# Patient Record
Sex: Female | Born: 1975 | Race: White | Hispanic: No | Marital: Married | Smoking: Never smoker
Health system: Southern US, Community
[De-identification: ages and names within clinical notes are randomized; demographics above are authoritative.]

## PROBLEM LIST (undated history)

## (undated) DIAGNOSIS — R519 Headache, unspecified: Secondary | ICD-10-CM

## (undated) DIAGNOSIS — E7800 Pure hypercholesterolemia, unspecified: Secondary | ICD-10-CM

## (undated) DIAGNOSIS — M7050 Other bursitis of knee, unspecified knee: Secondary | ICD-10-CM

## (undated) DIAGNOSIS — K8020 Calculus of gallbladder without cholecystitis without obstruction: Secondary | ICD-10-CM

## (undated) DIAGNOSIS — M25511 Pain in right shoulder: Secondary | ICD-10-CM

## (undated) DIAGNOSIS — Z9889 Other specified postprocedural states: Secondary | ICD-10-CM

## (undated) DIAGNOSIS — F418 Other specified anxiety disorders: Secondary | ICD-10-CM

## (undated) DIAGNOSIS — G2581 Restless legs syndrome: Secondary | ICD-10-CM

## (undated) DIAGNOSIS — G4700 Insomnia, unspecified: Secondary | ICD-10-CM

## (undated) DIAGNOSIS — E785 Hyperlipidemia, unspecified: Secondary | ICD-10-CM

## (undated) DIAGNOSIS — J329 Chronic sinusitis, unspecified: Principal | ICD-10-CM

## (undated) DIAGNOSIS — Z87442 Personal history of urinary calculi: Secondary | ICD-10-CM

## (undated) DIAGNOSIS — N760 Acute vaginitis: Principal | ICD-10-CM

## (undated) DIAGNOSIS — E663 Overweight: Secondary | ICD-10-CM

## (undated) DIAGNOSIS — N200 Calculus of kidney: Secondary | ICD-10-CM

## (undated) DIAGNOSIS — L732 Hidradenitis suppurativa: Secondary | ICD-10-CM

## (undated) DIAGNOSIS — M79601 Pain in right arm: Secondary | ICD-10-CM

## (undated) HISTORY — DX: Insomnia, unspecified: G47.00

## (undated) HISTORY — PX: COLONOSCOPY: SHX174

## (undated) HISTORY — DX: Pain in right shoulder: M25.511

## (undated) HISTORY — PX: OTHER SURGICAL HISTORY: SHX169

## (undated) HISTORY — DX: Restless legs syndrome: G25.81

## (undated) HISTORY — DX: Pain in right arm: M79.601

## (undated) HISTORY — DX: Overweight: E66.3

## (undated) HISTORY — DX: Other specified anxiety disorders: F41.8

## (undated) HISTORY — DX: Acute vaginitis: N76.0

## (undated) HISTORY — DX: Other bursitis of knee, unspecified knee: M70.50

## (undated) HISTORY — DX: Hidradenitis suppurativa: L73.2

## (undated) HISTORY — DX: Pure hypercholesterolemia, unspecified: E78.00

## (undated) HISTORY — DX: Calculus of kidney: N20.0

## (undated) HISTORY — DX: Calculus of gallbladder without cholecystitis without obstruction: K80.20

## (undated) HISTORY — DX: Hyperlipidemia, unspecified: E78.5

## (undated) HISTORY — DX: Chronic sinusitis, unspecified: J32.9

---

## 1997-05-19 ENCOUNTER — Emergency Department (HOSPITAL_COMMUNITY): Admission: EM | Admit: 1997-05-19 | Discharge: 1997-05-19 | Payer: Self-pay | Admitting: Emergency Medicine

## 1997-06-11 ENCOUNTER — Other Ambulatory Visit: Admission: RE | Admit: 1997-06-11 | Discharge: 1997-06-11 | Payer: Self-pay | Admitting: Sports Medicine

## 2000-02-06 ENCOUNTER — Encounter: Payer: Self-pay | Admitting: Emergency Medicine

## 2000-02-06 ENCOUNTER — Emergency Department (HOSPITAL_COMMUNITY): Admission: EM | Admit: 2000-02-06 | Discharge: 2000-02-06 | Payer: Self-pay | Admitting: Emergency Medicine

## 2000-02-22 ENCOUNTER — Encounter: Admission: RE | Admit: 2000-02-22 | Discharge: 2000-02-22 | Payer: Self-pay | Admitting: Gastroenterology

## 2000-02-22 ENCOUNTER — Encounter: Payer: Self-pay | Admitting: Gastroenterology

## 2000-02-28 ENCOUNTER — Encounter: Payer: Self-pay | Admitting: Gastroenterology

## 2000-02-28 ENCOUNTER — Encounter: Admission: RE | Admit: 2000-02-28 | Discharge: 2000-02-28 | Payer: Self-pay | Admitting: Gastroenterology

## 2000-03-16 ENCOUNTER — Encounter: Payer: Self-pay | Admitting: Gastroenterology

## 2000-03-16 ENCOUNTER — Encounter: Admission: RE | Admit: 2000-03-16 | Discharge: 2000-03-16 | Payer: Self-pay | Admitting: Gastroenterology

## 2003-06-02 ENCOUNTER — Other Ambulatory Visit: Admission: RE | Admit: 2003-06-02 | Discharge: 2003-06-02 | Payer: Self-pay | Admitting: Obstetrics and Gynecology

## 2004-07-13 ENCOUNTER — Ambulatory Visit (HOSPITAL_COMMUNITY): Admission: RE | Admit: 2004-07-13 | Discharge: 2004-07-13 | Payer: Self-pay | Admitting: Obstetrics and Gynecology

## 2004-12-30 ENCOUNTER — Inpatient Hospital Stay (HOSPITAL_COMMUNITY): Admission: AD | Admit: 2004-12-30 | Discharge: 2005-01-01 | Payer: Self-pay | Admitting: Obstetrics and Gynecology

## 2005-01-31 ENCOUNTER — Other Ambulatory Visit: Admission: RE | Admit: 2005-01-31 | Discharge: 2005-01-31 | Payer: Self-pay | Admitting: Obstetrics & Gynecology

## 2006-10-16 ENCOUNTER — Ambulatory Visit: Payer: Self-pay | Admitting: Internal Medicine

## 2006-10-16 DIAGNOSIS — G43009 Migraine without aura, not intractable, without status migrainosus: Secondary | ICD-10-CM

## 2006-10-16 DIAGNOSIS — G2581 Restless legs syndrome: Secondary | ICD-10-CM

## 2006-10-25 ENCOUNTER — Ambulatory Visit: Payer: Self-pay | Admitting: Internal Medicine

## 2006-10-26 LAB — CONVERTED CEMR LAB
AST: 16 units/L (ref 0–37)
Bilirubin, Direct: 0.1 mg/dL (ref 0.0–0.3)
Chloride: 107 meq/L (ref 96–112)
Cholesterol: 168 mg/dL (ref 0–200)
Eosinophils Absolute: 0 10*3/uL (ref 0.0–0.6)
Eosinophils Relative: 0.9 % (ref 0.0–5.0)
GFR calc non Af Amer: 69 mL/min
Glucose, Bld: 91 mg/dL (ref 70–99)
HCT: 39.4 % (ref 36.0–46.0)
Hemoglobin: 13.4 g/dL (ref 12.0–15.0)
Lymphocytes Relative: 30.3 % (ref 12.0–46.0)
MCV: 79.5 fL (ref 78.0–100.0)
Monocytes Absolute: 0.4 10*3/uL (ref 0.2–0.7)
Neutro Abs: 3.2 10*3/uL (ref 1.4–7.7)
Neutrophils Relative %: 60.3 % (ref 43.0–77.0)
Potassium: 3.5 meq/L (ref 3.5–5.1)
Sodium: 139 meq/L (ref 135–145)
TSH: 1.39 microintl units/mL (ref 0.35–5.50)
WBC: 5.1 10*3/uL (ref 4.5–10.5)

## 2006-12-20 ENCOUNTER — Encounter: Payer: Self-pay | Admitting: Internal Medicine

## 2006-12-27 ENCOUNTER — Ambulatory Visit: Payer: Self-pay | Admitting: Internal Medicine

## 2006-12-27 DIAGNOSIS — N281 Cyst of kidney, acquired: Secondary | ICD-10-CM

## 2006-12-27 DIAGNOSIS — K7689 Other specified diseases of liver: Secondary | ICD-10-CM

## 2006-12-27 DIAGNOSIS — R1013 Epigastric pain: Secondary | ICD-10-CM

## 2007-01-02 ENCOUNTER — Ambulatory Visit: Payer: Self-pay | Admitting: Internal Medicine

## 2007-01-03 ENCOUNTER — Encounter (INDEPENDENT_AMBULATORY_CARE_PROVIDER_SITE_OTHER): Payer: Self-pay | Admitting: *Deleted

## 2007-01-29 ENCOUNTER — Ambulatory Visit: Payer: Self-pay | Admitting: Internal Medicine

## 2007-01-29 DIAGNOSIS — L509 Urticaria, unspecified: Secondary | ICD-10-CM | POA: Insufficient documentation

## 2007-06-12 ENCOUNTER — Telehealth (INDEPENDENT_AMBULATORY_CARE_PROVIDER_SITE_OTHER): Payer: Self-pay | Admitting: *Deleted

## 2007-07-02 ENCOUNTER — Ambulatory Visit: Payer: Self-pay | Admitting: Sports Medicine

## 2007-07-02 DIAGNOSIS — M216X9 Other acquired deformities of unspecified foot: Secondary | ICD-10-CM

## 2007-07-02 DIAGNOSIS — M79609 Pain in unspecified limb: Secondary | ICD-10-CM

## 2007-07-02 DIAGNOSIS — M79A29 Nontraumatic compartment syndrome of unspecified lower extremity: Secondary | ICD-10-CM

## 2007-07-03 ENCOUNTER — Encounter: Payer: Self-pay | Admitting: Sports Medicine

## 2008-04-05 ENCOUNTER — Inpatient Hospital Stay (HOSPITAL_COMMUNITY): Admission: AD | Admit: 2008-04-05 | Discharge: 2008-04-08 | Payer: Self-pay | Admitting: Obstetrics and Gynecology

## 2009-01-02 ENCOUNTER — Ambulatory Visit: Payer: Self-pay | Admitting: Diagnostic Radiology

## 2009-01-02 ENCOUNTER — Emergency Department (HOSPITAL_BASED_OUTPATIENT_CLINIC_OR_DEPARTMENT_OTHER): Admission: EM | Admit: 2009-01-02 | Discharge: 2009-01-02 | Payer: Self-pay | Admitting: Emergency Medicine

## 2009-02-25 ENCOUNTER — Encounter: Admission: RE | Admit: 2009-02-25 | Discharge: 2009-05-20 | Payer: Self-pay | Admitting: Orthopedic Surgery

## 2009-04-15 ENCOUNTER — Emergency Department (HOSPITAL_BASED_OUTPATIENT_CLINIC_OR_DEPARTMENT_OTHER): Admission: EM | Admit: 2009-04-15 | Discharge: 2009-04-15 | Payer: Self-pay | Admitting: Emergency Medicine

## 2009-04-15 ENCOUNTER — Telehealth: Payer: Self-pay | Admitting: Internal Medicine

## 2009-04-15 ENCOUNTER — Ambulatory Visit: Payer: Self-pay | Admitting: Interventional Radiology

## 2009-10-07 ENCOUNTER — Ambulatory Visit: Payer: Self-pay | Admitting: Internal Medicine

## 2009-10-08 ENCOUNTER — Ambulatory Visit: Payer: Self-pay | Admitting: Internal Medicine

## 2009-10-15 ENCOUNTER — Telehealth (INDEPENDENT_AMBULATORY_CARE_PROVIDER_SITE_OTHER): Payer: Self-pay | Admitting: *Deleted

## 2009-10-22 ENCOUNTER — Telehealth (INDEPENDENT_AMBULATORY_CARE_PROVIDER_SITE_OTHER): Payer: Self-pay | Admitting: *Deleted

## 2009-11-01 ENCOUNTER — Telehealth: Payer: Self-pay | Admitting: Internal Medicine

## 2009-11-01 DIAGNOSIS — R05 Cough: Secondary | ICD-10-CM

## 2009-11-08 ENCOUNTER — Ambulatory Visit: Payer: Self-pay | Admitting: Internal Medicine

## 2009-11-09 ENCOUNTER — Encounter: Payer: Self-pay | Admitting: Internal Medicine

## 2009-11-10 ENCOUNTER — Telehealth (INDEPENDENT_AMBULATORY_CARE_PROVIDER_SITE_OTHER): Payer: Self-pay | Admitting: *Deleted

## 2009-11-10 ENCOUNTER — Ambulatory Visit: Payer: Self-pay | Admitting: Internal Medicine

## 2009-11-10 DIAGNOSIS — R079 Chest pain, unspecified: Secondary | ICD-10-CM

## 2009-11-11 ENCOUNTER — Ambulatory Visit: Payer: Self-pay | Admitting: Internal Medicine

## 2009-11-22 ENCOUNTER — Telehealth (INDEPENDENT_AMBULATORY_CARE_PROVIDER_SITE_OTHER): Payer: Self-pay | Admitting: *Deleted

## 2010-03-14 ENCOUNTER — Ambulatory Visit
Admission: RE | Admit: 2010-03-14 | Discharge: 2010-03-14 | Payer: Self-pay | Source: Home / Self Care | Attending: Internal Medicine | Admitting: Internal Medicine

## 2010-03-14 DIAGNOSIS — J019 Acute sinusitis, unspecified: Secondary | ICD-10-CM | POA: Insufficient documentation

## 2010-03-15 NOTE — Miscellaneous (Signed)
Summary: Orders Update pft charges  Clinical Lists Changes  Orders: Added new Service order of Carbon Monoxide diffusing w/capacity (94720) - Signed Added new Service order of Lung Volumes (94240) - Signed Added new Service order of Spirometry (Pre & Post) (94060) - Signed 

## 2010-03-15 NOTE — Assessment & Plan Note (Signed)
Summary: cough/cbs   Vital Signs:  Patient profile:   35 year old female Weight:      128.8 pounds Temp:     98.4 degrees F oral Pulse rate:   60 / minute Resp:     14 per minute BP sitting:   100 / 68  (left arm) Cuff size:   regular  Vitals Entered By: Shonna Chock CMA (October 07, 2009 12:34 PM) CC: Cough x 1 week, productive at times (barley yellow)   CC:  Cough x 1 week and productive at times (barley yellow).  History of Present Illness:  Cough      This is a 35 year old woman who presents with Cough x 1 week.  The patient reports productive cough with scant yellow material, pleuritic chest pain, and malaise, but denies shortness of breath, wheezing, exertional dyspnea, fever, and hemoptysis.  Associated symtpoms include sore throat.  The patient denies the following symptoms: cold/URI symptoms, nasal congestion, acid reflux symptoms, and peripheral edema.  The cough is worse with lying down.Pain with cough in back.  Ineffective prior treatments have included OTC cough medications( Nyquil, Tylenol Cold, Mucinex DM) .No PMH smoking ,ERD , or asthma.    Current Medications (verified): 1)  Topamax 25 Mg  Tabs (Topiramate) .... 2 Tabs Qhshold 2)  Low-Ogestrel 0.3-30 Mg-Mcg Tabs (Norgestrel-Ethinyl Estradiol) .... As Directed 3)  Daypro .... As Needed Migraine 4)  Axert .... As Needed Migraine 5)  Topamax 50 Mg Tabs (Topiramate) .... 3 By Mouth At Bedtime For Migraines  Allergies: 1)  ! * All Heavy Pain Meds 2)  ! * Most Nausea Medications  Physical Exam  General:  well-nourished,in no acute distress; alert,appropriate and cooperative throughout examination Ears:  External ear exam shows no significant lesions or deformities.  Otoscopic examination reveals clear canals, tympanic membranes are intact bilaterally without bulging, retraction, inflammation or discharge. Hearing is grossly normal bilaterally. Nose:  External nasal examination shows no deformity or inflammation.  Nasal mucosa are pink and moist without lesions or exudates. Mouth:  Oral mucosa and oropharynx without lesions or exudates.  Teeth in good repair. Minimal pharyngeal erythema.   Lungs:  Normal respiratory effort, chest expands symmetrically. Lungs are clear to auscultation, no crackles or wheezes. racking cough Heart:  regular rhythm, no murmur, no gallop, no rub, no JVD, and bradycardia.   Cervical Nodes:  No lymphadenopathy noted Axillary Nodes:  No palpable lymphadenopathy   Impression & Recommendations:  Problem # 1:  BRONCHITIS-ACUTE (ICD-466.0)  Her updated medication list for this problem includes:    Azithromycin 250 Mg Tabs (Azithromycin) .Marland Kitchen... As per pack    Hydrocod Polst-chlorphen Polst 10-8 Mg/85ml Lqcr (Chlorpheniramine-hydrocodone) .Marland Kitchen... 1 tsp every 8 hrs as needed  Orders: T-2 View CXR (71020TC)  Complete Medication List: 1)  Topamax 25 Mg Tabs (Topiramate) .... 2 tabs qhshold 2)  Low-ogestrel 0.3-30 Mg-mcg Tabs (Norgestrel-ethinyl estradiol) .... As directed 3)  Daypro  .... As needed migraine 4)  Axert  .... As needed migraine 5)  Topamax 50 Mg Tabs (Topiramate) .... 3 by mouth at bedtime for migraines 6)  Azithromycin 250 Mg Tabs (Azithromycin) .... As per pack 7)  Prednisone 20 Mg Tabs (Prednisone) .Marland Kitchen.. 1  two times a day with food 8)  Hydrocod Polst-chlorphen Polst 10-8 Mg/52ml Lqcr (Chlorpheniramine-hydrocodone) .Marland Kitchen.. 1 tsp every 8 hrs as needed  Patient Instructions: 1)  Drink as much fluid as you can tolerate for the next few days. Prescriptions: HYDROCOD POLST-CHLORPHEN POLST 10-8 MG/5ML LQCR (  CHLORPHENIRAMINE-HYDROCODONE) 1 tsp every 8 hrs as needed  #90 cc x 0   Entered and Authorized by:   Marga Melnick MD   Signed by:   Marga Melnick MD on 10/07/2009   Method used:   Printed then faxed to ...       [facility] [phone][facility] ([facility]ail[facility]       [address] [place]  [address]       Ph: [phone] or [phone]       Fax:  [phone]   RxID:   [id number] PREDNISONE 20 MG TABS (PREDNISONE) 1  two times a day with food  #14 x 0   Entered and Authorized by:   Marga Melnick MD   Signed by:   Marga Melnick MD on 10/07/2009   Method used:   Printed then faxed to ...       [facility]  [address] [phone][facility] ([facility]ail)       [address], [place]  [address]       Ph: [phone] or [phone]       Fax: [phone]   RxID:   [id number] AZITHROMYCIN 250 MG TABS (AZITHROMYCIN) as per pack  #1 x 0   Entered and Authorized by:   Marga Melnick MD   Signed by:   Marga Melnick MD on 10/07/2009   Method used:   Printed then faxed to ...       [facility]  [address] [phone][facility] ([facility]ail)       [address]       [place], [place]  [address]       Ph: [phone] or [phone]       Fax: [phone]   RxID:   [id number]

## 2010-03-15 NOTE — Progress Notes (Signed)
Summary: Rosita Fire pt to ED chest pain  Phone Note Call from Patient   Caller: Patient Summary of Call: pt c/o sharp, throbbing, intermittent chest pain locate under left breast, chest heaviness, headache, nausea.pt denies SOB, numbness, tingling, dizziness. pt has taken med for headache which has fail to treat it. pt states that at OV with headaches doctor on yesterday her BP was 84/52  pulse 60. pt advise ED, pt OK........Marland KitchenFelecia Deloach CMA  April 15, 2009 4:19 PM   Follow-up for Phone Call        noted Follow-up by: Marga Melnick MD,  April 15, 2009 6:08 PM

## 2010-03-15 NOTE — Progress Notes (Signed)
Summary: Triage: Ongoing cough  Phone Note Call from Patient Call back at Home Phone [phone]   Caller: Patient Summary of Call: Patient still with cough, please advise   Shonna Chock CMA  November 01, 2009 10:59 AM   Follow-up for Phone Call        see orders Follow-up by: Marga Melnick MD,  November 01, 2009 1:22 PM  Additional Follow-up for Phone Call Additional follow up Details #1::        I left message on voicemail informing patient that she  needs to have recheck on chest xray, order placed. Patient also needs to have PFT's which our referral coor. will call her with appointment date and time Additional Follow-up by: Shonna Chock CMA,  November 01, 2009 1:33 PM  New Problems: COUGH (ICD-786.2)   Additional Follow-up for Phone Call Additional follow up Details #2::    PATIENT'S APPT IS 11-09-2009 @ 9AM LEB PUL, PATIENT IS AWARE.   Follow-up by: Magdalen Spatz [facility]  November 03, 2009 11:40 AM  New Problems: COUGH [phone])

## 2010-03-15 NOTE — Progress Notes (Signed)
Summary: BENZONATATE REFILL  Phone Note Refill Request Call back at [phone] Message from:  Fax from Pharmacy on October 22, 2009 12:34 PM  Refills Requested: Medication #1:  BENZONATATE 200 MG CAPS 1 every 6 hrs as needed cough.   [facility] [address] [place]   FAX - [phone]  QTY = 15  Initial call taken by: Jerolyn Shin,  October 22, 2009 12:38 PM  Follow-up for Phone Call        Last OV 10/07/2009  Dr.Hopper please advise, this is not a med taken on a regular basis Follow-up by: Shonna Chock CMA,  October 22, 2009 2:25 PM  Additional Follow-up for Phone Call Additional follow up Details #1::        pt states that she has 2 tabs left of med to make it through the weekend. pt states that med is helping with the cough but it is not completly gone.............Marland KitchenFelecia Deloach CMA  October 22, 2009 3:20 PM     Additional Follow-up for Phone Call Additional follow up Details #2::    Per Dr.Hopper ok to fill #20  Patient aware rx sent Follow-up by: Shonna Chock CMA,  October 22, 2009 5:01 PM  Prescriptions: BENZONATATE 200 MG CAPS (BENZONATATE) 1 every 6 hrs as needed cough  #20 x 0   Entered by:   Shonna Chock CMA   Authorized by:   Marga Melnick MD   Signed by:   Shonna Chock CMA on 10/22/2009   Method used:   [facility]        [facility]  [id number]       [address]       [place], [place]  [address]       Ph: [phone] or [phone]       Fax: [phone]   RxID:   [id number]

## 2010-03-15 NOTE — Progress Notes (Signed)
Summary: PFT Results  Phone Note Call from Patient Call back at Home Phone [phone] Call back at [phone]   Caller: Patient Summary of Call: Patient called to inquire about the results of her PFT's and I read them to her. She said the brochodilater she used in the PFT's made her feel a little better but jittery at the same time. She says now she feel worse like she is getting sick again. When she takes a deep breath it hurts her chest. She also feels like she has muscos as well. She does not want to go back on the prednisone. Please advise.   Initial call taken by: Harold Barban,  November 10, 2009 1:01 PM  Follow-up for Phone Call        She needs a CT scan because of the pain; it is essential to rule out Pulmonary emboli which could mimic asthma & cause pleuritic pain Follow-up by: Marga Melnick MD,  November 10, 2009 1:49 PM  Additional Follow-up for Phone Call Additional follow up Details #1::        left message on machine ..........Marland KitchenDoristine Devoid CMA  November 10, 2009 2:20 PM   patient has appt today to f/u on CT scan .........Marland KitchenDoristine Devoid CMA  November 11, 2009 2:33 PM   New Problems: CHEST PAIN UNSPECIFIED (ICD-786.50)   New Problems: CHEST PAIN UNSPECIFIED (ICD-786.50)

## 2010-03-15 NOTE — Assessment & Plan Note (Signed)
Summary: Follow-up on CT/scm   Vital Signs:  Patient profile:   35 year old female Height:      64 inches Weight:      135.4 pounds BMI:     23.33 Temp:     98.0 degrees F oral Pulse rate:   72 / minute Resp:     12 per minute BP sitting:   100 / 62  (left arm) Cuff size:   regular  Vitals Entered By: Shonna Chock CMA (November 11, 2009 3:17 PM) CC: Follow-up visit: CT and ongoing cough, Cough   CC:  Follow-up visit: CT and ongoing cough and Cough.  History of Present Illness: Cough      This is a 35 year old woman who presents with  persistent Cough.  The patient reports non-productive cough  except in am with clear sputum.She  denies pleuritic chest pain, shortness of breath, wheezing, exertional dyspnea, fever, hemoptysis, and malaise.  The patient denies the following symptoms: cold/URI symptoms, sore throat, nasal congestion, chronic rhinitis, and acid reflux symptoms.  The cough is worse with exercise and lying down.  Ineffective prior treatments have included albuterol inhaler @ the   PFTs.  Diagnostic testing to date has included CXR, spirometry, and sinus CT scan.  Exercise tolerance reduced since cough began . [place] beach 6 weeks ago.No PMH of asthma. PFTs were normal , but 15% improvement if small airways flow. She was a Chemical engineer ( soccer).  Current Medications (verified): 1)  Lo Loestrin Fe 1 Mg-10 Mcg / 10 Mcg Tabs (Norethin-Eth Estrad-Fe Biphas) .... As Directed 2)  Daypro .... As Needed Migraine 3)  Axert .... As Needed Migraine 4)  Topamax 50 Mg Tabs (Topiramate) .... 3 By Mouth At Bedtime For Migraines 5)  Benzonatate 200 Mg Caps (Benzonatate) .Marland Kitchen.. 1 Every 6 Hrs As Needed Cough  Allergies: 1)  ! * All Heavy Pain Meds 2)  ! * Most Nausea Medications 3)  ! Codeine  Review of Systems ENT:  No frontal headache ,facial pain or purulence. Allergy:  Denies itching eyes and sneezing.  Physical Exam  General:  well-nourished; alert,appropriate and  cooperative throughout examination Eyes:  No corneal or conjunctival inflammation noted. Perrla.  Ears:  External ear exam shows no significant lesions or deformities.  Otoscopic examination reveals clear canals, tympanic membranes are intact bilaterally without bulging, retraction, inflammation or discharge. Hearing is grossly normal bilaterally. Nose:  External nasal examination shows no deformity or inflammation. Nasal mucosa are pink and moist without lesions or exudates. Mouth:  Oral mucosa and oropharynx without lesions or exudates.  Teeth in good repair. Lungs:  Normal respiratory effort, chest expands symmetrically. Lungs are clear to auscultation, no crackles or wheezes. Heart:  Normal rate and regular rhythm. S1 and S2 normal without gallop, murmur, click, rub or other extra sounds. Cervical Nodes:  No lymphadenopathy noted Axillary Nodes:  No palpable lymphadenopathy   Impression & Recommendations:  Problem # 1:  COUGH (ICD-786.2) Subclinical RAD   Complete Medication List: 1)  Lo Loestrin Fe 1 Mg-10 Mcg / 10 Mcg Tabs (Norethin-eth estrad-fe biphas) .... As directed 2)  Daypro  .... As needed migraine 3)  Axert  .... As needed migraine 4)  Topamax 50 Mg Tabs (Topiramate) .... 3 by mouth at bedtime for migraines 5)  Benzonatate 200 Mg Caps (Benzonatate) .Marland Kitchen.. 1 every 6 hrs as needed cough 6)  Dulera 200-5 Mcg/act Aero (Mometasone furo-formoterol fum) .Marland Kitchen.. 1-2 puffs every 12 hrs , gargle &  spit after use 7)  Singulair 10 Mg Tabs (Montelukast sodium) .Marland Kitchen.. 1 once daily 8)  Ventolin Hfa 108 (90 Base) Mcg/act Aers (Albuterol sulfate) .Marland Kitchen.. 1-2 puffs every 4-6 hrs as needed for bouts of cough  Patient Instructions: 1)  Meds as directed .Drink  NON dairy , ROOM temperature fluids  as needed for cough. Prescriptions: VENTOLIN HFA 108 (90 BASE) MCG/ACT AERS (ALBUTEROL SULFATE) 1-2 puffs every 4-6 hrs as needed for bouts of cough  #1 x 5   Entered and Authorized by:   Marga Melnick MD    Signed by:   Marga Melnick MD on 11/11/2009   Method used:   Print then Give to Patient   RxID:   [id number] SINGULAIR 10 MG TABS (MONTELUKAST SODIUM) 1 once daily  #30 x 5   Entered and Authorized by:   Marga Melnick MD   Signed by:   Marga Melnick MD on 11/11/2009   Method used:   Print then Give to Patient   RxID:   [id number] DULERA 200-5 MCG/ACT AERO (MOMETASONE FURO-FORMOTEROL FUM) 1-2 puffs every 12 hrs , gargle & spit after use  #1 x 5   Entered and Authorized by:   Marga Melnick MD   Signed by:   Marga Melnick MD on 11/11/2009   Method used:   Print then Give to Patient   RxID:   [id number]

## 2010-03-15 NOTE — Progress Notes (Signed)
Summary:  COUGH STILL NO BETTER  Phone Note Call from Patient Call back at [phone]   Caller: Patient Summary of Call: Pt states that she finished antibiotics but she still has a little of the cough med left which she is still taking. Pt still c/o a non-productive cough that is keeping her up at night. Pt would like to know what else she can do for cough. [facility]..............Marland KitchenFelecia Deloach CMA  October 15, 2009 1:19 PM   Follow-up for Phone Call        per dr hopper prednisone 20 mg 1/2 three times a day #9, phenergan with codeine 1 TSP every 6 hours as needed 90CC..........Marland KitchenFelecia Deloach CMA  October 15, 2009 2:43 PM  left message to call office  to confirm no allergy to codeine...............Marland KitchenFelecia Deloach CMA  October 15, 2009 3:10 PM  pt states that she is unable to take codeine. pls advise on another med...................Marland KitchenFelecia Deloach CMA  October 15, 2009 4:02 PM    Additional Follow-up for Phone Call Additional follow up Details #1::        Patient aware rx sent to pharmacy Additional Follow-up by: Shonna Chock CMA,  October 15, 2009 4:22 PM   New Allergies: ! CODEINE New/Updated Medications: PREDNISONE 20 MG TABS (PREDNISONE) Take 1/2 tab  two times a day with food BENZONATATE 200 MG CAPS (BENZONATATE) 1 every 6 hrs as needed cough New Allergies: ! CODEINEPrescriptions: BENZONATATE 200 MG CAPS (BENZONATATE) 1 every 6 hrs as needed cough  #15 x 0   Entered and Authorized by:   Marga Melnick MD   Signed by:   Marga Melnick MD on 10/15/2009   Method used:   [facility]ed to ...       [facility]  [address] [phone]* ([facility]ail)       [address] [place]  [address]       Ph: [phone] or [phone]       Fax: [phone]   RxID:   [id number] PREDNISONE 20 MG TABS (PREDNISONE) Take 1/2 tab  two times a day with food  #9 x 0   Entered by:   Jeremy Johann CMA   Authorized by:   Marga Melnick MD   Signed by:   Jeremy Johann CMA on 10/15/2009   Method used:   Faxed to ...       [facility]  [address] [phone]* ([facility]ail)       [address]       [place], [place]  [address]       Ph: [phone] or [phone]       Fax: [phone]   RxID:   [id number]

## 2010-03-15 NOTE — Progress Notes (Signed)
Summary: rx for yeast infection  Phone Note Call from Patient Call back at Home Phone [phone]   Caller: Patient Summary of Call: [facility] [id number] an [facility] - she was given  amoxicillian - she told tech she always gets yeast infection when she takes antibiotics -they wouldnt give her rx - she wants to know if  rx can be called in for  diflucan - [facility]  Initial call taken by: Okey Regal [place],  November 22, 2009 1:58 PM  Follow-up for Phone Call        Per Dr.Hopper Edson Snowball to give Diflucan, left message on voicemail informing patient rx sent in Follow-up by: Shonna Chock CMA,  November 22, 2009 3:26 PM    New/Updated Medications: DIFLUCAN 150 MG TABS (FLUCONAZOLE) 1 by mouth x 1 Prescriptions: DIFLUCAN 150 MG TABS (FLUCONAZOLE) 1 by mouth x 1  #1 x 0   Entered by:   Shonna Chock CMA   Authorized by:   Marga Melnick MD   Signed by:   Shonna Chock CMA on 11/22/2009   Method used:   [facility]        [id number]       [address]       [place], [place]  [address]       Ph: [phone] or [phone]       Fax: [phone]   RxID:   [id number]

## 2010-03-23 NOTE — Assessment & Plan Note (Signed)
Summary: SINUS/FLU/PH   Vital Signs:  Patient profile:   35 year old female Weight:      132.6 pounds BMI:     22.84 Temp:     98.5 degrees F oral Pulse rate:   64 / minute Resp:     15 per minute BP sitting:   100 / 68  (left arm) Cuff size:   regular  Vitals Entered By: Shonna Chock CMA (March 14, 2010 1:49 PM) CC: Achy, drainage, sore throat, headache and nauseated off/on x 2 weeks, URI symptoms   CC:  Achy, drainage, sore throat, headache and nauseated off/on x 2 weeks, and URI symptoms.  History of Present Illness:    Onset as ST several weeks ago; her daughters ( 5& 2) had Strep throat but her test was negative. Waxing & waning symptoms until 01/28 when drainage began.  She now  reports nasal congestion, purulent nasal discharge, and productive cough, but denies sore throat and earache.  The patient denies fever, dyspnea, and wheezing.  The patient also reports muscle aches.  The patient denies  frontal headache.  Risk factors for Strep sinusitis include bilateral facial & ethmoid  pain, tooth pain, and tender adenopathy. Rx: Nyquil, Sudafed   Current Medications (verified): 1)  Aviane 0.1-20 Mg-Mcg Tabs (Levonorgestrel-Ethinyl Estrad) .Marland Kitchen.. 1 By Mouth As Directed 2)  Daypro .... As Needed Migraine 3)  Axert .... As Needed Migraine 4)  Topamax 50 Mg Tabs (Topiramate) .... 3 By Mouth At Bedtime For Migraines  Allergies: 1)  ! * All Heavy Pain Meds 2)  ! * Most Nausea Medications 3)  ! Codeine  Physical Exam  General:  well-nourished,in no acute distress; alert,appropriate and cooperative throughout examination Ears:  External ear exam shows no significant lesions or deformities.  Otoscopic examination reveals clear canals, tympanic membranes are intact bilaterally without bulging, retraction, inflammation or discharge. Hearing is grossly normal bilaterally. Nose:  External nasal examination shows no deformity or inflammation. Nasal mucosa are eryhthematous without lesions  or exudates. R nareclosed  Mouth:  Oral mucosa and oropharynx without lesions or exudates.  Tonsils 1+.Teeth in good repair. Lungs:  Normal respiratory effort, chest expands symmetrically. Lungs are clear to auscultation, no crackles or wheezes. Cervical Nodes:  L posterior LN tender.   Axillary Nodes:  No palpable lymphadenopathy   Impression & Recommendations:  Problem # 1:  SINUSITIS- ACUTE-NOS (ICD-461.9)  The following medications were removed from the medication list:    Benzonatate 200 Mg Caps (Benzonatate) .Marland Kitchen... 1 every 6 hrs as needed cough Her updated medication list for this problem includes:    Amoxicillin-pot Clavulanate 875-125 Mg Tabs (Amoxicillin-pot clavulanate) .Marland Kitchen... 1 every 12 hrs with a meal; may affect bcp    Fluticasone Propionate 50 Mcg/act Susp (Fluticasone propionate) .Marland Kitchen... 1 spray two times a day as needed  Problem # 2:  BRONCHITIS-ACUTE (ICD-466.0)  The following medications were removed from the medication list:    Benzonatate 200 Mg Caps (Benzonatate) .Marland Kitchen... 1 every 6 hrs as needed cough    Dulera 200-5 Mcg/act Aero (Mometasone furo-formoterol fum) .Marland Kitchen... 1-2 puffs every 12 hrs , gargle & spit after use    Singulair 10 Mg Tabs (Montelukast sodium) .Marland Kitchen... 1 once daily    Ventolin Hfa 108 (90 Base) Mcg/act Aers (Albuterol sulfate) .Marland Kitchen... 1-2 puffs every 4-6 hrs as needed for bouts of cough Her updated medication list for this problem includes:    Amoxicillin-pot Clavulanate 875-125 Mg Tabs (Amoxicillin-pot clavulanate) .Marland Kitchen... 1 every 12 hrs with  a meal; may affect bcp  Complete Medication List: 1)  Aviane 0.1-20 Mg-mcg Tabs (Levonorgestrel-ethinyl estrad) .Marland Kitchen.. 1 by mouth as directed 2)  Daypro  .... As needed migraine 3)  Axert  .... As needed migraine 4)  Topamax 50 Mg Tabs (Topiramate) .... 3 by mouth at bedtime for migraines 5)  Amoxicillin-pot Clavulanate 875-125 Mg Tabs (Amoxicillin-pot clavulanate) .Marland Kitchen.. 1 every 12 hrs with a meal; may affect bcp 6)   Fluconazole 150 Mg Tabs (Fluconazole) .Marland Kitchen.. 1 as needed 7)  Fluticasone Propionate 50 Mcg/act Susp (Fluticasone propionate) .Marland Kitchen.. 1 spray two times a day as needed  Patient Instructions: 1)  Drink as much  NON dairy fluid as you can tolerate for the next few days. Prescriptions: FLUTICASONE PROPIONATE 50 MCG/ACT SUSP (FLUTICASONE PROPIONATE) 1 spray two times a day as needed  #1 x 5   Entered and Authorized by:   Marga Melnick MD   Signed by:   Marga Melnick MD on 03/14/2010   Method used:   [facility]        [id number]       [address] [place], [place]  [address]       Ph: [phone] or [phone]       Fax: [phone]   RxID:   [id number] FLUCONAZOLE 150 MG TABS (FLUCONAZOLE) 1 as needed  #1 x 0   Entered and Authorized by:   Marga Melnick MD   Signed by:   Marga Melnick MD on 03/14/2010   Method used:   [facility]        [facility]  [address] [id number]       [address] [place], [place]  [address]       Ph: [phone] or [phone]       Fax: [phone]   RxID:   [id number] AMOXICILLIN-POT CLAVULANATE 875-125 MG TABS (AMOXICILLIN-POT CLAVULANATE) 1 every 12 hrs with a meal; may affect BCP  #20 x 0   Entered and Authorized by:   Marga Melnick MD   Signed by:   Marga Melnick MD on 03/14/2010   Method used:   [facility]        [facility]  [address] [phone][facility]       [address]       [place], [place]  [address]       Ph: [phone] or [phone]       Fax: [phone]   RxID:   [id number]    Orders Added: 1)  Est. Patient Level III [99371]

## 2010-05-09 LAB — CBC
HCT: 37.5 % (ref 36.0–46.0)
Platelets: 228 10*3/uL (ref 150–400)
WBC: 7.5 10*3/uL (ref 4.0–10.5)

## 2010-05-09 LAB — POCT CARDIAC MARKERS
CKMB, poc: <1 ng/mL — ABNORMAL LOW (ref 1.0–8.0)
Myoglobin, poc: 46.5 ng/mL (ref 12–200)
Troponin i, poc: <0.05 ng/mL (ref 0.00–0.09)

## 2010-05-09 LAB — D-DIMER, QUANTITATIVE: D-Dimer, Quant: 0.49 ug/mL-FEU — ABNORMAL HIGH (ref 0.00–0.48)

## 2010-05-09 LAB — BASIC METABOLIC PANEL
BUN: 9 mg/dL (ref 6–23)
Creatinine, Ser: 1 mg/dL (ref 0.4–1.2)
GFR calc non Af Amer: >60 mL/min (ref >60)
Potassium: 3.9 mEq/L (ref 3.5–5.1)

## 2010-05-09 LAB — DIFFERENTIAL
Lymphocytes Relative: 32 % (ref 12–46)
Lymphs Abs: 2.4 10*3/uL (ref 0.7–4.0)
Neutrophils Relative %: 60 % (ref 43–77)

## 2010-05-31 LAB — CBC
Platelets: 207 10*3/uL (ref 150–400)
RBC: 3.6 MIL/uL — ABNORMAL LOW (ref 3.87–5.11)
RDW: 13.1 % (ref 11.5–15.5)
WBC: 12.7 10*3/uL — ABNORMAL HIGH (ref 4.0–10.5)

## 2010-05-31 LAB — CCBB MATERNAL DONOR DRAW

## 2010-10-03 ENCOUNTER — Ambulatory Visit (INDEPENDENT_AMBULATORY_CARE_PROVIDER_SITE_OTHER): Payer: BC Managed Care – PPO | Admitting: Internal Medicine

## 2010-10-03 ENCOUNTER — Encounter: Payer: Self-pay | Admitting: Internal Medicine

## 2010-10-03 ENCOUNTER — Telehealth: Payer: Self-pay | Admitting: *Deleted

## 2010-10-03 DIAGNOSIS — R5381 Other malaise: Secondary | ICD-10-CM

## 2010-10-03 DIAGNOSIS — R110 Nausea: Secondary | ICD-10-CM

## 2010-10-03 DIAGNOSIS — L03211 Cellulitis of face: Secondary | ICD-10-CM

## 2010-10-03 DIAGNOSIS — L0201 Cutaneous abscess of face: Secondary | ICD-10-CM

## 2010-10-03 DIAGNOSIS — R5383 Other fatigue: Secondary | ICD-10-CM

## 2010-10-03 MED ORDER — MUPIROCIN CALCIUM 2 % EX CREA: TOPICAL_CREAM | CUTANEOUS | Status: DC

## 2010-10-03 MED ORDER — RANITIDINE HCL 150 MG PO TABS: 150.0000 mg | ORAL_TABLET | Freq: Two times a day (BID) | ORAL | Status: DC

## 2010-10-03 MED ORDER — AMOXICILLIN-POT CLAVULANATE 500-125 MG PO TABS: 1.0000 | ORAL_TABLET | ORAL | Status: AC

## 2010-10-03 MED ORDER — AMOXICILLIN-POT CLAVULANATE 500-125 MG PO TABS: 1.0000 | ORAL_TABLET | ORAL | Status: DC

## 2010-10-03 NOTE — Telephone Encounter (Signed)
Pharmacy states that Bactroban cream does not come in generic ok to change to ointment which does have a generic. Please advise

## 2010-10-03 NOTE — Progress Notes (Signed)
Subjective:    Patient ID: Madeline Murphy, female    DOB: June 09, 1975, 35 y.o.   MRN: [id number]  HPI FATIGUE Onset: several months   Fatigue @ rest : yes    Primarily  physical fatigue: yes Symptoms: Fever/ chills : no  Night sweats: no                                                                                            Vision changes ( blurred/ double/ loss): no                                                                                                  Hoarseness or swallowing dysfunction: no                                                                                          Bowel changes( constipation/ diarrhea): no                                                                                      Weight change: yes, gain of 14 # over 8 months   Exertional chest pain: no  Dyspnea on exertion: no  Cough: no  Hemoptysis: no  New medications: no  Leg swelling: no  Orthopnea: no  PND: no  Melena/ rectal bleeding: no  Adenopathy:only after abrasion of chin @ pool 8/16 (struck chin on pool bottom) Severe snoring: no  Daytime sleepiness: yes, marked  Skin / hair / nail changes: no  Temperature intolerance( heat/ cold) : no  Feeling depressed: yes, ?a little  Anhedonia: no  Altered appetite: no  Poor sleep/ Apnea : no  Abnormal bruising / bleeding or enlarged lymph nodes: no                                                                          PMH/ FH of thyroid disease: ?      Review of Systems As of today  tender area under jaw  w/o fever , chills or sweats. No history of tick exposure. Extreme nausea @ night w/o abd pain.     Objective:   Physical Exam Gen.: Healthy and well-nourished in appearance. Alert, appropriate and cooperative throughout exam. Head: Normocephalic without obvious abnormalities Eyes: No corneal or conjunctival inflammation  noted.Extraocular motion intact.  Mouth: Oral mucosa and oropharynx reveal no lesions or exudates. Teeth in good repair. No erythema Neck: No deformities, masses. Marked  tenderness noted L submandibular area. Range of motion &. Thyroid normal. Lungs: Normal respiratory effort; chest expands symmetrically. Lungs are clear to auscultation without rales, wheezes, or increased work of breathing. Heart: Normal rate and rhythm. Normal S1 and S2. No gallop, click, or rub. Grade 1/2 systolic  murmur. Abdomen: Bowel sounds normal; abdomen soft and nontender. No masses, organomegaly or hernias noted.                                                                          Musculoskeletal/extremities:No clubbing, cyanosis, edema, or deformity noted. Range of motion  normal .Tone & strength  normal.Joints normal. Nail health  Good w/o onycholysis. Vascular: Carotid, radial artery, dorsalis pedis and  posterior tibial pulses are full and equal. No bruits present. Neurologic: Alert and oriented x3. Deep tendon reflexes symmetrical and normal.    No tremor        Skin: Intact without suspicious lesions or rashes. Abrasion anterior chin. Lymph: No cervical, axillary  lymphadenopathy present. Psych: Mood and affect are normal. Normally interactive                                                                                         Assessment & Plan:  #1 fatigue with weight gain  #2 nausea, nocturnal  #3 abrasion with tender lymphadenitis in the chin/submandibular area

## 2010-10-03 NOTE — Patient Instructions (Addendum)
The triggers for nausea or  dyspepsia   include stress; the "aspirin family" ; alcohol; peppermint; and caffeine (coffee, tea, cola, and chocolate). The aspirin family would include aspirin and the nonsteroidal agents such as ibuprofen &  Naproxen. Tylenol would not cause this. If having nausea or  dyspepsia ; food & drink should be avoided for @ least 2 hours before going to bed.  Stop Neosporin ; use Bactroban instead

## 2010-10-03 NOTE — Progress Notes (Signed)
Addended by: Doristine Devoid on: 10/03/2010 03:42 PM   Modules accepted: Orders

## 2010-10-03 NOTE — Telephone Encounter (Signed)
Ointment OK

## 2010-10-03 NOTE — Telephone Encounter (Signed)
Spoke w/ pharmacy aware to dispense ointment

## 2010-10-04 LAB — CBC WITH DIFFERENTIAL/PLATELET
Basophils Relative: 0.7 % (ref 0.0–3.0)
Eosinophils Absolute: 0.1 10*3/uL (ref 0.0–0.7)
Eosinophils Relative: 1.4 % (ref 0.0–5.0)
Hemoglobin: 13.3 g/dL (ref 12.0–15.0)
Lymphocytes Relative: 36.4 % (ref 12.0–46.0)
MCHC: 32.7 g/dL (ref 30.0–36.0)
Neutro Abs: 2.3 10*3/uL (ref 1.4–7.7)
RBC: 5.02 Mil/uL (ref 3.87–5.11)
WBC: 4.6 10*3/uL (ref 4.5–10.5)

## 2010-10-04 LAB — BASIC METABOLIC PANEL
CO2: 25 mEq/L (ref 19–32)
Calcium: 9.3 mg/dL (ref 8.4–10.5)
Chloride: 108 mEq/L (ref 96–112)
Sodium: 139 mEq/L (ref 135–145)

## 2010-10-04 LAB — HEPATIC FUNCTION PANEL
ALT: 15 U/L (ref 0–35)
AST: 19 U/L (ref 0–37)
Alkaline Phosphatase: 48 U/L (ref 39–117)
Bilirubin, Direct: 0.1 mg/dL (ref 0.0–0.3)
Total Protein: 7.5 g/dL (ref 6.0–8.3)

## 2010-10-05 ENCOUNTER — Ambulatory Visit (INDEPENDENT_AMBULATORY_CARE_PROVIDER_SITE_OTHER): Payer: BC Managed Care – PPO | Admitting: Internal Medicine

## 2010-10-05 ENCOUNTER — Telehealth: Payer: Self-pay | Admitting: Internal Medicine

## 2010-10-05 ENCOUNTER — Encounter: Payer: Self-pay | Admitting: Internal Medicine

## 2010-10-05 ENCOUNTER — Ambulatory Visit (HOSPITAL_BASED_OUTPATIENT_CLINIC_OR_DEPARTMENT_OTHER)
Admission: RE | Admit: 2010-10-05 | Discharge: 2010-10-05 | Disposition: A | Discharge status: Home / Self Care | Payer: BC Managed Care – PPO | Source: Ambulatory Visit | Attending: Internal Medicine | Admitting: Internal Medicine

## 2010-10-05 VITALS — BP 102/70 | HR 64 | Temp 98.0°F

## 2010-10-05 DIAGNOSIS — L0291 Cutaneous abscess, unspecified: Secondary | ICD-10-CM

## 2010-10-05 DIAGNOSIS — L0390 Cellulitis, unspecified: Secondary | ICD-10-CM

## 2010-10-05 DIAGNOSIS — R51 Headache: Secondary | ICD-10-CM

## 2010-10-05 DIAGNOSIS — L0201 Cutaneous abscess of face: Secondary | ICD-10-CM | POA: Insufficient documentation

## 2010-10-05 DIAGNOSIS — L03211 Cellulitis of face: Secondary | ICD-10-CM | POA: Insufficient documentation

## 2010-10-05 MED ORDER — LEVOFLOXACIN 500 MG PO TABS: 500.0000 mg | ORAL_TABLET | Freq: Every day | ORAL | Status: AC

## 2010-10-05 MED ORDER — IBUPROFEN 800 MG PO TABS: 800.0000 mg | ORAL_TABLET | Freq: Three times a day (TID) | ORAL | Status: AC | PRN

## 2010-10-05 NOTE — Progress Notes (Signed)
  Subjective:    Patient ID: Madeline Murphy, female    DOB: March 26, 1975, 35 y.o.   MRN: [id number]  HPI she has been on Augmentin 500 mg every 12 hours since 8/20. This chin lesion has swollen slightly more and has become extremely painful. She's had associated pain in her mandibular > maxillary teethimm and diffuse frontal headaches.  She denies fever, chills, or purulent drainage other than a scant bit of yellow material from the chin lesion.  She's been using Bactroban on this area.  Additional history reveals that the swimming pool  in which she sustained in her [place]  [place]  [place]  There is no personal or family history of MRSA infections.   Review of Systems     Objective:   Physical Exam on exam she is uncomfortable but in no acute distress.  Pupils are equal and reactive to light. Extraocular motion is intact. She has no evidence of conjunctivitis. Vision is intact  She does have some tender lymph node anteriorly  under the mandible to  left of midline. There are no other significant lymph nodes in the cervical area or axillary area.  There is marked erythema and induration without fluctuance of the chin  anteriorly. Eschar formation is present. No frank herpetic lesions are noted.  Chest is clear to auscultation.  Heart rhythm is regular and relatively slow.          Assessment & Plan:  #1 cellulitis of the chin secondary to injury the swimming pool. Clinically a herpetic etiology is not present. Unusual organism such as Mycobacterium marinum  must be considered in view of the setting.  #2 headache related to pain; clinically there is no evidence of rhinosinusitis  #3 history of intolerance to narcotics manifested as nausea.  Plan:  the eschar was removed after cleansing with Betadine and alcohol. No significant discharge was present.  Sinus films will be recommended because of the severe pain. The antibiotic will be changed to Levaquin. She is to use  saline soaks 3-4 times a day to the chin.  High dose nonsteroidals will be initiated as she feels she cannot tolerate narcotic pain medicine.

## 2010-10-05 NOTE — Progress Notes (Signed)
Addended by: Doristine Devoid on: 10/05/2010 04:01 PM   Modules accepted: Orders

## 2010-10-05 NOTE — Patient Instructions (Signed)
Dip gauze in  sterile saline and applied to the wound 3-4/ day. Cover the wound with Telfa , non stick dressing  without any antibiotic ointment. The saline can be purchased at the drugstore or you can make your own .Boil cup of salt in a gallon of water. Store mixture  in a clean container.Report Warning  signs as discussed (red streaks, pus, fever, increasing pain).

## 2010-10-05 NOTE — Telephone Encounter (Signed)
Pt called left msg on triage voicemail says that chin abrasion worse increase swellen and painful.   Unable to call pt back walked in office since she hadn't received telephone call back and was scheduled appt to see Dr. Alwyn Ren.

## 2010-10-08 LAB — WOUND CULTURE: Gram Stain: NONE SEEN

## 2010-12-27 ENCOUNTER — Telehealth: Payer: Self-pay

## 2010-12-27 ENCOUNTER — Telehealth: Payer: Self-pay | Admitting: Internal Medicine

## 2010-12-27 MED ORDER — PREDNISONE 20 MG PO TABS: 20.0000 mg | ORAL_TABLET | Freq: Two times a day (BID) | ORAL | Status: DC

## 2010-12-27 MED ORDER — BENZONATATE 200 MG PO CAPS: 200.0000 mg | ORAL_CAPSULE | Freq: Three times a day (TID) | ORAL | Status: AC | PRN

## 2010-12-27 NOTE — Telephone Encounter (Signed)
Pt is aware.  

## 2010-12-27 NOTE — Telephone Encounter (Signed)
The cough syrup are  codeine derivatives; her chart states codeine  causes near syncope.We could use a short course of prednisone 20 mg  Bid #10. With codeine listed as an allergy only Robitussin DM and Tessalon are options unfortunately if symptoms persist he may be a candidate for airway opening medications such as albuterol. I would need to see her to  Verify whether  reactive airways disease is present

## 2010-12-27 NOTE — Telephone Encounter (Signed)
Pt called back and states she is already on the Tesselon pearls and has been on for 1 to 2 weeks. Pt states the pearls are not helping.  Pt would like a cough syrup called in. Pls advise.

## 2010-12-27 NOTE — Telephone Encounter (Signed)
Discuss with patient, Rx sent. 

## 2010-12-27 NOTE — Telephone Encounter (Signed)
Codeine is listed as causing nausea and near syncope. Tessalon 200 mg one every 6-8 hours as needed dispense 15

## 2011-01-03 ENCOUNTER — Ambulatory Visit (INDEPENDENT_AMBULATORY_CARE_PROVIDER_SITE_OTHER): Payer: BC Managed Care – PPO | Admitting: Internal Medicine

## 2011-01-03 ENCOUNTER — Encounter: Payer: Self-pay | Admitting: Internal Medicine

## 2011-01-03 ENCOUNTER — Ambulatory Visit (HOSPITAL_BASED_OUTPATIENT_CLINIC_OR_DEPARTMENT_OTHER)
Admission: RE | Admit: 2011-01-03 | Discharge: 2011-01-03 | Disposition: A | Discharge status: Home / Self Care | Payer: BC Managed Care – PPO | Source: Ambulatory Visit | Attending: Internal Medicine | Admitting: Internal Medicine

## 2011-01-03 DIAGNOSIS — J069 Acute upper respiratory infection, unspecified: Secondary | ICD-10-CM

## 2011-01-03 DIAGNOSIS — J019 Acute sinusitis, unspecified: Secondary | ICD-10-CM

## 2011-01-03 DIAGNOSIS — R51 Headache: Secondary | ICD-10-CM

## 2011-01-03 DIAGNOSIS — R059 Cough, unspecified: Secondary | ICD-10-CM | POA: Insufficient documentation

## 2011-01-03 DIAGNOSIS — R05 Cough: Secondary | ICD-10-CM

## 2011-01-03 DIAGNOSIS — R509 Fever, unspecified: Secondary | ICD-10-CM

## 2011-01-03 DIAGNOSIS — J3489 Other specified disorders of nose and nasal sinuses: Secondary | ICD-10-CM | POA: Insufficient documentation

## 2011-01-03 LAB — CBC WITH DIFFERENTIAL/PLATELET
Basophils Absolute: 0 10*3/uL (ref 0.0–0.1)
Eosinophils Absolute: 0.4 10*3/uL (ref 0.0–0.7)
Hemoglobin: 14 g/dL (ref 12.0–15.0)
Lymphocytes Relative: 15 % (ref 12.0–46.0)
MCHC: 32.6 g/dL (ref 30.0–36.0)
Monocytes Relative: 5.1 % (ref 3.0–12.0)
Neutrophils Relative %: 77.1 % — ABNORMAL HIGH (ref 43.0–77.0)
Platelets: 365 10*3/uL (ref 150.0–400.0)
RDW: 13.5 % (ref 11.5–14.6)

## 2011-01-03 MED ORDER — MOMETASONE FURO-FORMOTEROL FUM 200-5 MCG/ACT IN AERO: INHALATION_SPRAY | RESPIRATORY_TRACT | Status: DC

## 2011-01-03 MED ORDER — HYDROCODONE-HOMATROPINE 5-1.5 MG/5ML PO SYRP: 5.0000 mL | ORAL_SOLUTION | Freq: Four times a day (QID) | ORAL | Status: DC | PRN

## 2011-01-03 NOTE — Progress Notes (Signed)
  Subjective:    Patient ID: Madeline Murphy, female    DOB: 1975/06/21, 35 y.o.   MRN: [id number]  HPI Respiratory tract infection Onset/symptoms:1 month ago as ear pressure, head congestion Exposures (illness/environmental/extrinsic): both daughters ill Progression of symptoms:to chest symptoms Treatments/response:[facility] X 2; Amox 11/3 -11/10 & 11/10- 11/17 Omnicef & Prednisone called in here; NSAIDS, Neti pot Present symptoms: Fever/chills/sweats:no Frontal headache:yes Facial pain:yes, minor Nasal purulence:minimal Sore throat:no Dental pain:not now Lymphadenopathy:no Wheezing/shortness of breath: some DOE  Cough/sputum/hemoptysis:no sputum for several days Pleuritic pain:no Associated extrinsic/allergic symptoms:itchy eyes/ sneezing:no Past medical history: Seasonal allergies : no /asthma:no . No PMH of EIB.  Smoking history:never               Review of Systems     Objective:   Physical Exam General appearance is of good health and nourishment; no acute distress or increased work of breathing is present.  No  lymphadenopathy about the head, neck, or axilla noted.   Eyes: No conjunctival inflammation or lid edema is present.   Ears:  External ear exam shows no significant lesions or deformities.  Otoscopic examination reveals clear canals, tympanic membranes are intact bilaterally without bulging, retraction, inflammation or discharge.  Nose:  External nasal examination shows no deformity or inflammation. Nasal mucosa are pink and moist without lesions or exudates. No septal dislocation .No obstruction to airflow.   Oral exam: Dental hygiene is good; lips and gums are healthy appearing.There is no oropharyngeal erythema or exudate noted.      Heart:  Normal rate and regular rhythm. S1 and S2 normal without gallop,  click, rub or other extra sounds.  Grade 1/2 systolic murmur  Lungs:Chest clear to auscultation; no wheezes, rhonchi,rales ,or rubs present.No  increased work of breathing. Although there is no audible wheezing; she has a horrific racking cough.    Extremities:  No cyanosis, edema, or clubbing  noted    Skin: Warm & dry         Assessment & Plan:  #1 protracted respiratory tract infection with frontal and facial pressure without nasal purulence. I am concerned about the possibility of subclinical reactive airway disease. She noted no response the amoxicillin; Omnicef she attributed resistant organisms. An atypical organism or reactive airways would be the most likely explanation for this protracted picture.  #2 Severe cough without sputum production ; I am  concerned about the risk of rib fracture. She has a history of reaction to codeine but this was in the context of postop anesthesia and multiple other pain medications. There is no history of angioedema, fever, or rash.  Plan: #1 a trial of Dulera  200/5 will be employed. A CBC and differential and CT imaging will be pursued. I am concerned about additional antibiotics resulting in colitis or other significant adverse effects. A narcotic cough suppressant will also be initiated as the history does not suggest true codeine allergy

## 2011-01-03 NOTE — Patient Instructions (Signed)
Plain Mucinex for thick secretions ;force NON dairy fluids . Use a Neti pot daily as needed for sinus congestion Order for x-rays entered into  the computer; these will be performed at [facility] facility. No appointment is necessary.

## 2011-01-04 ENCOUNTER — Other Ambulatory Visit: Payer: Self-pay

## 2011-01-04 ENCOUNTER — Telehealth: Payer: Self-pay

## 2011-01-04 MED ORDER — FLUTICASONE PROPIONATE 50 MCG/ACT NA SUSP: 1.0000 | Freq: Two times a day (BID) | NASAL | Status: DC

## 2011-01-04 NOTE — Telephone Encounter (Signed)
Spoke with patient about labs and CT report, patient informed rx sent to [facility])

## 2011-01-04 NOTE — Telephone Encounter (Signed)
Message copied by Edgardo Roys on Wed Jan 04, 2011  4:54 PM ------      Message from: Pecola Lawless      Created: Tue Jan 03, 2011  6:15 PM       The sinus CT changes suggest allergic or inflammatory rather than bacterial (rhinosinusitis) findings. Rhinosinusitis would be associated with an air-fluid level ( a pocket of pus with air above it)  with marked mucosal changes  NOT seen here.       Plain Mucinex for thick secretions ;force NON dairy fluids . Use a Neti pot daily as needed for sinus congestion . Fluticasone 1 spray in each nostril twice a day as needed for congestion , inflammation. Use the "crossover" technique .Report fever, exudate("pus") or progressive pain in sinuses.        I recommend an Allergy  Consultation  with Dr Jetty Duhamel  if non infectious symptoms persist or progress. [employer]

## 2011-01-09 ENCOUNTER — Telehealth: Payer: Self-pay | Admitting: Internal Medicine

## 2011-01-09 MED ORDER — METRONIDAZOLE 500 MG PO TABS: 500.0000 mg | ORAL_TABLET | Freq: Three times a day (TID) | ORAL | Status: AC

## 2011-01-09 NOTE — Telephone Encounter (Signed)
RX called in, would not go through electronically  

## 2011-01-09 NOTE — Telephone Encounter (Signed)
Patient was seen last week she is still coughing very bad - her upper back hurts when she coughs - [facility]

## 2011-01-09 NOTE — Telephone Encounter (Signed)
Metronidazole  500 mg 3 times a day, dispense 30. Avoid alcohol while on this

## 2011-01-09 NOTE — Telephone Encounter (Signed)
Pt indicated that cough med Rx does help but it make her very tired so she only takes it at night. Pt still c/o coughing up thick white mucous in AM but mainly dry.

## 2011-01-09 NOTE — Telephone Encounter (Signed)
Discuss with patient, Rx sent. 

## 2011-01-13 ENCOUNTER — Other Ambulatory Visit: Payer: Self-pay | Admitting: *Deleted

## 2011-01-13 MED ORDER — HYDROCODONE-HOMATROPINE 5-1.5 MG/5ML PO SYRP: 5.0000 mL | ORAL_SOLUTION | Freq: Four times a day (QID) | ORAL | Status: AC | PRN

## 2011-01-13 NOTE — Telephone Encounter (Signed)
Discuss with patient, Rx sent. 

## 2011-01-13 NOTE — Telephone Encounter (Signed)
Pt left VM stating that she now has back pain due to her coughing so severely. Pt is currently out of cough med and would like to get a refill of hydromet. Please advise

## 2011-01-13 NOTE — Telephone Encounter (Signed)
hydromet 120cc 1 tsp every 6 hrs prn

## 2011-01-26 ENCOUNTER — Encounter: Payer: Self-pay | Admitting: Family Medicine

## 2011-01-26 ENCOUNTER — Ambulatory Visit (INDEPENDENT_AMBULATORY_CARE_PROVIDER_SITE_OTHER): Payer: BC Managed Care – PPO | Admitting: Family Medicine

## 2011-01-26 DIAGNOSIS — R109 Unspecified abdominal pain: Secondary | ICD-10-CM

## 2011-01-26 DIAGNOSIS — J40 Bronchitis, not specified as acute or chronic: Secondary | ICD-10-CM

## 2011-01-26 DIAGNOSIS — R110 Nausea: Secondary | ICD-10-CM

## 2011-01-26 DIAGNOSIS — R1013 Epigastric pain: Secondary | ICD-10-CM

## 2011-01-26 DIAGNOSIS — K7689 Other specified diseases of liver: Secondary | ICD-10-CM

## 2011-01-26 DIAGNOSIS — R5381 Other malaise: Secondary | ICD-10-CM

## 2011-01-26 DIAGNOSIS — D72829 Elevated white blood cell count, unspecified: Secondary | ICD-10-CM

## 2011-01-26 DIAGNOSIS — R5383 Other fatigue: Secondary | ICD-10-CM

## 2011-01-26 DIAGNOSIS — G43009 Migraine without aura, not intractable, without status migrainosus: Secondary | ICD-10-CM

## 2011-01-26 LAB — CBC
Hemoglobin: 12.9 g/dL (ref 12.0–15.0)
MCH: 26.1 pg (ref 26.0–34.0)
MCHC: 31.7 g/dL (ref 30.0–36.0)
Platelets: 288 10*3/uL (ref 150–400)

## 2011-01-26 LAB — TSH: TSH: 0.958 u[IU]/mL (ref 0.350–4.500)

## 2011-01-26 LAB — T4, FREE: Free T4: 1.16 ng/dL (ref 0.80–1.80)

## 2011-01-26 MED ORDER — AZITHROMYCIN 250 MG PO TABS: ORAL_TABLET | ORAL | Status: AC

## 2011-01-26 MED ORDER — HYDROCODONE-HOMATROPINE 5-1.5 MG/5ML PO SYRP: 5.0000 mL | ORAL_SOLUTION | Freq: Every evening | ORAL | Status: DC | PRN

## 2011-01-26 MED ORDER — LORATADINE 10 MG PO TABS: 10.0000 mg | ORAL_TABLET | Freq: Every day | ORAL | Status: DC

## 2011-01-26 MED ORDER — METHYLPREDNISOLONE (PAK) 4 MG PO TABS: 4.0000 mg | ORAL_TABLET | Freq: Every day | ORAL | Status: DC

## 2011-01-26 MED ORDER — GUAIFENESIN ER 600 MG PO TB12: 600.0000 mg | ORAL_TABLET | Freq: Two times a day (BID) | ORAL | Status: DC

## 2011-01-26 NOTE — Patient Instructions (Signed)

## 2011-01-26 NOTE — Assessment & Plan Note (Addendum)
Usually occurs in evening. No definitive pattern related to eating or not eating or certain foods. She is encouraged to try ginger and see if that helps.

## 2011-01-26 NOTE — Assessment & Plan Note (Signed)
Frequency is notably less than it used to be, she follows with HA specialist and has stopped her Topamax herself without increase in HA.

## 2011-01-26 NOTE — Assessment & Plan Note (Signed)
Last ultrasound showed some cysts and recommended a repeat US to assess stability. Due to some recent abdominal pain and nausea and epigastric pain on exam today will repeat an [place] of abdomen at this time

## 2011-01-27 ENCOUNTER — Ambulatory Visit (HOSPITAL_BASED_OUTPATIENT_CLINIC_OR_DEPARTMENT_OTHER)
Admission: RE | Admit: 2011-01-27 | Discharge: 2011-01-27 | Disposition: A | Discharge status: Home / Self Care | Payer: BC Managed Care – PPO | Source: Ambulatory Visit | Attending: Family Medicine | Admitting: Family Medicine

## 2011-01-27 DIAGNOSIS — R059 Cough, unspecified: Secondary | ICD-10-CM | POA: Insufficient documentation

## 2011-01-27 DIAGNOSIS — J40 Bronchitis, not specified as acute or chronic: Secondary | ICD-10-CM

## 2011-01-27 DIAGNOSIS — R05 Cough: Secondary | ICD-10-CM | POA: Insufficient documentation

## 2011-01-27 NOTE — Progress Notes (Signed)
Quick Note:  Left a message for pt to return call. ______ 

## 2011-01-27 NOTE — Progress Notes (Signed)
Quick Note:  Pt aware ______ 

## 2011-01-30 ENCOUNTER — Encounter: Payer: Self-pay | Admitting: Family Medicine

## 2011-01-30 ENCOUNTER — Ambulatory Visit (HOSPITAL_BASED_OUTPATIENT_CLINIC_OR_DEPARTMENT_OTHER)
Admission: RE | Admit: 2011-01-30 | Discharge: 2011-01-30 | Disposition: A | Discharge status: Home / Self Care | Payer: BC Managed Care – PPO | Source: Ambulatory Visit | Attending: Family Medicine | Admitting: Family Medicine

## 2011-01-30 DIAGNOSIS — R109 Unspecified abdominal pain: Secondary | ICD-10-CM

## 2011-01-30 DIAGNOSIS — K7689 Other specified diseases of liver: Secondary | ICD-10-CM

## 2011-01-30 DIAGNOSIS — J40 Bronchitis, not specified as acute or chronic: Secondary | ICD-10-CM | POA: Insufficient documentation

## 2011-01-30 DIAGNOSIS — Z09 Encounter for follow-up examination after completed treatment for conditions other than malignant neoplasm: Secondary | ICD-10-CM | POA: Insufficient documentation

## 2011-01-30 DIAGNOSIS — R110 Nausea: Secondary | ICD-10-CM | POA: Insufficient documentation

## 2011-01-30 NOTE — Progress Notes (Signed)
Patient ID: Madeline Murphy, female   DOB: 10-31-1975, 35 y.o.   MRN: [id number] Madeline Murphy [id number] [birth year] 01/30/2011      Progress Note New Patient  Subjective  Chief Complaint  Chief Complaint  Patient presents with  . Cough    Pt wished to change care. Cough x 3 months. Now has chest and back pain. Has completed amoxicillin, omnicef and metronidazole.    HPI    Patient is a 35 Caucasian female who is in today for evaluation of cough and to transfer care. She has been struggling with cough intermittent bronchitis and back pain for 3 months. Has finished courses of Omnicef, amoxicillin and metronidazole. She gets mild relief but her symptoms always recur. No recent fevers chills although she has had them in the past. No myalgias, fevers chills or ear pain although again these occurred in the past. She did have an episode of abdominal pain with nausea and vomiting last week but that has resolved no chest pain, palpitations, shortness of breath, GI or GU complaints noted at this time.  Past Medical History  Diagnosis Date  . Bronchitis 01/30/2011    Past Surgical History  Procedure Date  . Chronc compartmenal syndrome     S/P ortho surgery x3    Family History  Problem Relation Age of Onset  . Coronary artery disease    . Hypertension    . Hyperlipidemia Mother   . Heart disease Mother     palpitation  . Heart disease Father     s/p bypass and stent  . Hyperlipidemia Father   . Hypertension Father   . Otitis media Daughter     recurrent strep  . Cancer Maternal Aunt     breast  . Cancer Maternal Grandfather     possibly stomach  . Aneurysm Paternal Grandmother     likely brain  . Cancer Paternal Grandmother     breast  . Otitis media Daughter     History   Social History  . Marital Status: Married    Spouse Name: N/A    Number of Children: N/A  . Years of Education: N/A   Occupational History  . Not on file.   Social History Main Topics  . Smoking  status: Never Smoker   . Smokeless tobacco: Not on file  . Alcohol Use: Yes  . Drug Use: No  . Sexually Active: Yes   Other Topics Concern  . Not on file   Social History Narrative  . No narrative on file    Current Outpatient Prescriptions on File Prior to Visit  Medication Sig Dispense Refill  . Almotriptan Malate (AXERT PO) Take by mouth as needed.        . ethynodiol-ethinyl estradiol (ZOVIA) 1-50 MG-MCG tablet Take 1 tablet by mouth daily.        . fluticasone (FLONASE) 50 MCG/ACT nasal spray Place 1 spray into the nose 2 (two) times daily.  16 g  1  . Mometasone Furo-Formoterol Fum 200-5 MCG/ACT AERO 1-2  inhalations every 12 hours; gargle and spit after use  8.8 g  0  . Oxaprozin (DAYPRO PO) Take by mouth as needed.        . ranitidine (ZANTAC) 150 MG tablet Take 1 tablet (150 mg total) by mouth 2 (two) times daily.  60 tablet  1  . topiramate (TOPAMAX) 50 MG tablet Take by mouth 2 (two) times daily. Take 3 tab  At bedtime for migraines  Allergies  Allergen Reactions  . Codeine     REACTION: nausea & syncope    Review of Systems  Review of Systems  Constitutional: Positive for malaise/fatigue. Negative for fever and chills.  HENT: Positive for congestion and sore throat. Negative for hearing loss and nosebleeds.   Eyes: Negative for pain and discharge.  Respiratory: Positive for cough and sputum production. Negative for shortness of breath and wheezing.   Cardiovascular: Negative for chest pain, palpitations and leg swelling.  Gastrointestinal: Negative for heartburn, nausea, vomiting, abdominal pain, diarrhea, constipation and blood in stool.  Genitourinary: Negative for dysuria, urgency, frequency and hematuria.  Musculoskeletal: Negative for myalgias, back pain and falls.  Skin: Negative for rash.  Neurological: Negative for dizziness, tremors, sensory change, focal weakness, loss of consciousness, weakness and headaches.  Endo/Heme/Allergies: Negative for  polydipsia. Does not bruise/bleed easily.  Psychiatric/Behavioral: Negative for depression and suicidal ideas. The patient is not nervous/anxious and does not have insomnia.     Objective  BP 119/74  Pulse 69  Temp(Src) 98.7 F (37.1 C) (Oral)  Resp 18  Wt 148 lb (67.132 kg)  SpO2 100%  Physical Exam  Physical Exam  Constitutional: She is oriented to person, place, and time and well-developed, well-nourished, and in no distress. No distress.  HENT:  Head: Normocephalic and atraumatic.  Right Ear: External ear normal.  Left Ear: External ear normal.  Nose: Nose normal.  Mouth/Throat: Oropharynx is clear and moist. No oropharyngeal exudate.       Oropharynx mildly erythematous, nasal mucosa boggy and erythematous  Eyes: Conjunctivae are normal. Pupils are equal, round, and reactive to light. Right eye exhibits no discharge. Left eye exhibits no discharge. No scleral icterus.  Neck: Normal range of motion. Neck supple. No thyromegaly present.  Cardiovascular: Normal rate, regular rhythm, normal heart sounds and intact distal pulses.   No murmur heard. Pulmonary/Chest: Effort normal and breath sounds normal. No respiratory distress. She has no wheezes. She has no rales.  Abdominal: Soft. Bowel sounds are normal. She exhibits no distension and no mass. There is no tenderness.  Musculoskeletal: Normal range of motion. She exhibits no edema and no tenderness.  Lymphadenopathy:    She has no cervical adenopathy.  Neurological: She is alert and oriented to person, place, and time. She has normal reflexes. No cranial nerve deficit. Coordination normal.  Skin: Skin is warm and dry. No rash noted. She is not diaphoretic.  Psychiatric: Mood, memory and affect normal.       Assessment & Plan  Nausea Usually occurs in evening. No definitive pattern related to eating or not eating or certain foods. She is encouraged to try ginger and see if that helps.  HEPATIC CYST Last ultrasound  showed some cysts and recommended a repeat US to assess stability. Due to some recent abdominal pain and nausea and epigastric pain on exam today will repeat an [place] of abdomen at this time  COMMON MIGRAINE Frequency is notably less than it used to be, she follows with HA specialist and has stopped her Topamax herself without increase in HA.   Bronchitis Started on Medrol and Azithromycin, increase fluids and rest call if no improvment

## 2011-01-30 NOTE — Assessment & Plan Note (Signed)
Started on Medrol and Azithromycin, increase fluids and rest call if no improvment

## 2011-02-02 ENCOUNTER — Other Ambulatory Visit: Payer: Self-pay | Admitting: Family Medicine

## 2011-02-02 ENCOUNTER — Ambulatory Visit (INDEPENDENT_AMBULATORY_CARE_PROVIDER_SITE_OTHER)
Admission: RE | Admit: 2011-02-02 | Discharge: 2011-02-02 | Disposition: A | Discharge status: Home / Self Care | Payer: BC Managed Care – PPO | Source: Ambulatory Visit | Attending: Family Medicine | Admitting: Family Medicine

## 2011-02-02 ENCOUNTER — Ambulatory Visit (HOSPITAL_BASED_OUTPATIENT_CLINIC_OR_DEPARTMENT_OTHER)
Admission: RE | Admit: 2011-02-02 | Discharge: 2011-02-02 | Disposition: A | Discharge status: Home / Self Care | Payer: BC Managed Care – PPO | Source: Ambulatory Visit | Attending: Family Medicine | Admitting: Family Medicine

## 2011-02-02 ENCOUNTER — Ambulatory Visit (INDEPENDENT_AMBULATORY_CARE_PROVIDER_SITE_OTHER): Payer: BC Managed Care – PPO | Admitting: Family Medicine

## 2011-02-02 ENCOUNTER — Encounter: Payer: Self-pay | Admitting: Family Medicine

## 2011-02-02 VITALS — BP 109/72 | HR 106 | Temp 99.0°F | Ht 64.0 in | Wt 145.0 lb

## 2011-02-02 DIAGNOSIS — R109 Unspecified abdominal pain: Secondary | ICD-10-CM | POA: Insufficient documentation

## 2011-02-02 DIAGNOSIS — R05 Cough: Secondary | ICD-10-CM

## 2011-02-02 DIAGNOSIS — J209 Acute bronchitis, unspecified: Secondary | ICD-10-CM

## 2011-02-02 DIAGNOSIS — R112 Nausea with vomiting, unspecified: Secondary | ICD-10-CM

## 2011-02-02 DIAGNOSIS — D1809 Hemangioma of other sites: Secondary | ICD-10-CM | POA: Insufficient documentation

## 2011-02-02 LAB — CBC WITH DIFFERENTIAL/PLATELET
Basophils Absolute: 0 10*3/uL (ref 0.0–0.1)
Basophils Relative: 0 % (ref 0–1)
Eosinophils Relative: 1 % (ref 0–5)
HCT: 42.6 % (ref 36.0–46.0)
Lymphocytes Relative: 6 % — ABNORMAL LOW (ref 12–46)
MCHC: 32.4 g/dL (ref 30.0–36.0)
MCV: 81.3 fL (ref 78.0–100.0)
Monocytes Absolute: 1 10*3/uL (ref 0.1–1.0)
Neutro Abs: 10.7 10*3/uL — ABNORMAL HIGH (ref 1.7–7.7)
Platelets: 231 10*3/uL (ref 150–400)
RDW: 13.5 % (ref 11.5–15.5)
WBC: 12.5 10*3/uL — ABNORMAL HIGH (ref 4.0–10.5)

## 2011-02-02 LAB — COMPREHENSIVE METABOLIC PANEL
ALT: 14 U/L (ref 0–35)
AST: 14 U/L (ref 0–37)
Alkaline Phosphatase: 54 U/L (ref 39–117)
BUN: 7 mg/dL (ref 6–23)
Chloride: 101 mEq/L (ref 96–112)
Creat: 0.78 mg/dL (ref 0.50–1.10)

## 2011-02-02 LAB — POCT URINALYSIS DIPSTICK
Leukocytes, UA: NEGATIVE
Protein, UA: 30
Urobilinogen, UA: 0.2
pH, UA: 8.5

## 2011-02-02 MED ORDER — ALBUTEROL SULFATE HFA 108 (90 BASE) MCG/ACT IN AERS: 2.0000 | INHALATION_SPRAY | Freq: Four times a day (QID) | RESPIRATORY_TRACT | Status: DC | PRN

## 2011-02-02 MED ORDER — MOMETASONE FURO-FORMOTEROL FUM 200-5 MCG/ACT IN AERO: INHALATION_SPRAY | RESPIRATORY_TRACT | Status: DC

## 2011-02-02 MED ORDER — PREDNISONE 20 MG PO TABS: ORAL_TABLET | ORAL | Status: DC

## 2011-02-02 MED ORDER — PROMETHAZINE HCL 25 MG RE SUPP: 25.0000 mg | Freq: Four times a day (QID) | RECTAL | Status: AC | PRN

## 2011-02-02 MED ORDER — IOHEXOL 300 MG/ML  SOLN
100.0000 mL | Freq: Once | INTRAMUSCULAR | Status: AC | PRN
  Administered 2011-02-02: 100 mL via INTRAVENOUS

## 2011-02-02 MED ORDER — PROMETHAZINE HCL 25 MG/ML IJ SOLN
25.0000 mg | Freq: Once | INTRAMUSCULAR | Status: AC
  Administered 2011-02-02: 25 mg via INTRAMUSCULAR

## 2011-02-02 MED ORDER — PROMETHAZINE HCL 25 MG PO TABS: 25.0000 mg | ORAL_TABLET | Freq: Three times a day (TID) | ORAL | Status: AC | PRN

## 2011-02-02 NOTE — Progress Notes (Signed)
Pt informed and states understanding. Pt also stated she doesn't feel as sick to her stomach but her head is really hurting. I will call and check on patient in the morning.

## 2011-02-02 NOTE — Progress Notes (Signed)
OFFICE NOTE  02/02/2011  CC:  Chief Complaint  Patient presents with  . Fever  . Emesis    vomiting - this morning  . Nasal Congestion    X 2 days     HPI: Patient is a 35 y.o. Caucasian female who is here for vomiting mainly but has long list of symptoms. She reports getting treated with z-pack and 5d prednisone taper about 8d ago for bronchitis/prolonged respiratory illness. At that time she was having problems with recurrent n/v/abd pain and a repeat abd u/s was done and this was normal (stable hepatic cysts noted compared to 2006).    She was feeling improved until yesterday when many symptoms hit with a vengeance: severe nasal congestion +PND, face pain, HA, myalgias, coughing, nausea and upper abdominal pain, subjective f/c.  The most severe sx's now are nausea and she essentially is vomiting EVERYTHING---nonbilious, nonbloody. Even ice chips come up.  Says she is very thirsty but this didn't start until AFTER her n/v came on.   Denies pelvic pain, vag bleeding, or new vag d/c.  LMP was the last week of November, says menses are regular, says she is sexually active (married). She has been in contact with many people lately who have had n/v/diarrhea illness, but she has not had any diarrhea with her n/v.  She did have 3 formed BMs in the last day or so but nothing loose.  Says when she coughs hard she feels like she might have to have diarrhea BM.  Denies dysuria, urinary urgency, hematuria, or urinary frequency.  No rash. Complains of HA, hurting essentially everywhere on her body including neck.    Pertinent PMH:  Past Medical History  Diagnosis Date  . Bronchitis 01/30/2011  Migraine HA's Abd pain/nausea issues as of late--no clear etiology.   Past Surgical History  Procedure Date  . Chronc compartmenal syndrome     S/P ortho surgery x3   Family History  Problem Relation Age of Onset  . Coronary artery disease    . Hypertension    . Hyperlipidemia Mother   . Heart  disease Mother     palpitation  . Heart disease Father     s/p bypass and stent  . Hyperlipidemia Father   . Hypertension Father   . Otitis media Daughter     recurrent strep  . Cancer Maternal Aunt     breast  . Cancer Maternal Grandfather     possibly stomach  . Aneurysm Paternal Grandmother     likely brain  . Cancer Paternal Grandmother     breast  . Otitis media Daughter    SOCIAL HX: married, 2 children (67 y/o and 3 y/o).  No T/A/Ds.  Pertinent Meds: Flonase, dulera, just finished z-pack and prednisone as stated in HPI  PE: Blood pressure 109/72, pulse 106, temperature 99 F (37.2 C), temperature source Oral, height 5\' 4"  (1.626 m), weight 145 lb (65.772 kg), last menstrual period 01/09/2011, SpO2 98.00%. GEN: lying supine on table with mask on, appears very tired and ill but is alert and interactive.  Nontoxic. HEENT: oral mucosa pink, decent amount of moisture, no pharyngeal eryth or swelling or exudate.  Nose with edematous/injected mucosa. Paranasal sinuses TTP, as is frontal sinus area. Neck - No masses or thyromegaly or limitation in range of motion CV: Regular, tachy to 110 or so, no murmur/rub/gallop PULM: nonlabored resps.  Aeration mildly diminished diffusely, without crackles or wheezing, exp phase minimally prolonged, with EXCESSIVE post-exhalation coughing fits.  ABD: soft, nondistended, BS a bit hypoactive.  TTP diffusely with shallow palpation, but most significantly in mid epigastric area.  No guarding or rebound. NO HSM or mass. EXT: no clubbing, cyanosis, or edema.   LAB: urine preg neg UA neg except 1+ protein.  (SG 1.020)  IMPRESSION AND PLAN: Severe N/V abd pain---needs r/o acute abdomen. Ongoing bronchitis.  Gave 25mg  phenergan IM in office today, did stat CBC, CMET, H. Pylori, and arranged for stat CT abd/pelvis with IV contrast (pt unable to tolerate PO contrast) and CXR. Phenergan tabs sent to pharmacy.  She will continue dulera and hycodan.   She'll add OTC oxymetazolone to her flonase. Any further management/meds/follow up will depend on what her testing reveals and how she responds to anti-emetics today.  FOLLOW UP: as stated above

## 2011-02-02 NOTE — Progress Notes (Signed)
Quick Note:  Please notify pt that her chest x-ray, CT scan, and blood work today were all normal. I want her to get back on prednisone for a while for her bronchitis, so I'll send eRx for that to her pharmacy. I have also sent rx's for phenergan suppositories in case she cannot keep a phenergan pill down. I also RF'd her Dulera inhaler and rx'd an albuterol "rescue" inhaler called Ventolin that I want her to pick up and take 2 puffs every 4 hours as needed for cough/chest tightness/wheezing.  If she is unable to keep anything on her stomach at all (including ice chips/small sips of clear liquids), then she should go to the ER (whichever one she wants). Otherwise plan on f/u early next week with Dr. Abner Greenspan. We'll call and check on her at home tomorrow.--PM ______

## 2011-02-03 ENCOUNTER — Telehealth: Payer: Self-pay

## 2011-02-03 NOTE — Telephone Encounter (Signed)
FYI: I called patient and she states she still has a little bit of nausea but feels 10 times better.

## 2011-02-03 NOTE — Telephone Encounter (Signed)
Noted.-pM

## 2011-04-23 ENCOUNTER — Encounter (HOSPITAL_BASED_OUTPATIENT_CLINIC_OR_DEPARTMENT_OTHER): Payer: Self-pay

## 2011-04-23 ENCOUNTER — Emergency Department (INDEPENDENT_AMBULATORY_CARE_PROVIDER_SITE_OTHER): Payer: BC Managed Care – PPO

## 2011-04-23 ENCOUNTER — Emergency Department (HOSPITAL_BASED_OUTPATIENT_CLINIC_OR_DEPARTMENT_OTHER)
Admission: EM | Admit: 2011-04-23 | Discharge: 2011-04-23 | Disposition: A | Discharge status: Home / Self Care | Payer: BC Managed Care – PPO | Attending: Emergency Medicine | Admitting: Emergency Medicine

## 2011-04-23 DIAGNOSIS — S8990XA Unspecified injury of unspecified lower leg, initial encounter: Secondary | ICD-10-CM | POA: Insufficient documentation

## 2011-04-23 DIAGNOSIS — W219XXA Striking against or struck by unspecified sports equipment, initial encounter: Secondary | ICD-10-CM

## 2011-04-23 DIAGNOSIS — S99929A Unspecified injury of unspecified foot, initial encounter: Secondary | ICD-10-CM | POA: Insufficient documentation

## 2011-04-23 DIAGNOSIS — S9030XA Contusion of unspecified foot, initial encounter: Secondary | ICD-10-CM

## 2011-04-23 DIAGNOSIS — M25473 Effusion, unspecified ankle: Secondary | ICD-10-CM | POA: Insufficient documentation

## 2011-04-23 DIAGNOSIS — M79609 Pain in unspecified limb: Secondary | ICD-10-CM

## 2011-04-23 DIAGNOSIS — M25476 Effusion, unspecified foot: Secondary | ICD-10-CM | POA: Insufficient documentation

## 2011-04-23 DIAGNOSIS — Y9239 Other specified sports and athletic area as the place of occurrence of the external cause: Secondary | ICD-10-CM | POA: Insufficient documentation

## 2011-04-23 DIAGNOSIS — Y9366 Activity, soccer: Secondary | ICD-10-CM | POA: Insufficient documentation

## 2011-04-23 MED ORDER — IBUPROFEN 800 MG PO TABS: 800.0000 mg | ORAL_TABLET | Freq: Three times a day (TID) | ORAL | Status: AC

## 2011-04-23 MED ORDER — IBUPROFEN 800 MG PO TABS
800.0000 mg | ORAL_TABLET | Freq: Once | ORAL | Status: AC
  Administered 2011-04-23: 800 mg via ORAL
  Filled 2011-04-23: qty 1

## 2011-04-23 NOTE — ED Notes (Signed)
Pt in wheelchair. Ice applied.

## 2011-04-23 NOTE — ED Provider Notes (Signed)
History     CSN: [id number]  Arrival date & time 04/23/11  1932   First MD Initiated Contact with Patient 04/23/11 2032      Chief Complaint  Patient presents with  . Foot Injury    (Consider location/radiation/quality/duration/timing/severity/associated sxs/prior treatment) Patient is a 36 y.o. female presenting with foot injury. The history is provided by the patient. No language interpreter was used.  Foot Injury  The incident occurred 3 to 5 hours ago. [place] The injury mechanism was a direct blow. The pain is present in the right foot. The quality of the pain is described as aching and burning. The pain is at a severity of 5/10. The pain is moderate. The pain has been constant since onset. Associated symptoms include inability to bear weight. Pertinent negatives include no numbness. The symptoms are aggravated by nothing. She has tried ice for the symptoms. The treatment provided moderate relief.  Pt complains of pain in foot after accidentally kicking someone in the shin while playing soccer.  Past Medical History  Diagnosis Date  . Bronchitis 01/30/2011    Past Surgical History  Procedure Date  . Chronc compartmenal syndrome     S/P ortho surgery x3    Family History  Problem Relation Age of Onset  . Coronary artery disease    . Hypertension    . Hyperlipidemia Mother   . Heart disease Mother     palpitation  . Heart disease Father     s/p bypass and stent  . Hyperlipidemia Father   . Hypertension Father   . Otitis media Daughter     recurrent strep  . Cancer Maternal Aunt     breast  . Cancer Maternal Grandfather     possibly stomach  . Aneurysm Paternal Grandmother     likely brain  . Cancer Paternal Grandmother     breast  . Otitis media Daughter     History  Substance Use Topics  . Smoking status: Never Smoker   . Smokeless tobacco: Not on file  . Alcohol Use: Yes    OB History    Grav Para Term Preterm Abortions  TAB SAB Ect Mult Living                  Review of Systems  Musculoskeletal: Positive for joint swelling.  Neurological: Negative for numbness.  All other systems reviewed and are negative.    Allergies  Codeine  Home Medications   Current Outpatient Rx  Name Route Sig Dispense Refill  . ETHYNODIOL DIAC-ETH ESTRADIOL 1-50 MG-MCG PO TABS Oral Take 1 tablet by mouth daily.      Marland Kitchen FIBER SELECT GUMMIES PO CHEW Oral Chew 2 each by mouth daily.    . IBUPROFEN 200 MG PO TABS Oral Take 400 mg by mouth once as needed. For pain    . RANITIDINE HCL 150 MG PO TABS Oral Take 1 tablet (150 mg total) by mouth 2 (two) times daily. 60 tablet 1  . TETRAHYDROZOLINE HCL 0.05 % OP SOLN Both Eyes Place 1 drop into both eyes every morning.      BP 109/69  Pulse 62  Temp(Src) 98.2 F (36.8 C) (Oral)  Resp 20  Ht 5\' 4"  (1.626 m)  Wt 150 lb (68.04 kg)  BMI 25.75 kg/m2  SpO2 100%  Physical Exam  Nursing note and vitals reviewed. Constitutional: She appears well-developed and well-nourished.  Musculoskeletal: She exhibits tenderness.  Tender plantar aspect and dorsal aspect along 1st metatarsal and tarsal bones on foot.  Neurological: She is alert.  Skin: Skin is warm.  Psychiatric: She has a normal mood and affect.    ED Course  Procedures (including critical care time)  Labs Reviewed - No data to display No results found.   No diagnosis found.    MDM  xrays no fracture,  Pt placed in a post op shoe and given crutches.  Pt declined pain medication.       Lonia Skinner [place], [place] 04/23/11 2159

## 2011-04-23 NOTE — ED Notes (Signed)
Pt states that she was playing soccer today and kicked another guy in the shin and she had instant onset of severe pain across top of Right foot and under her arch.  Pt states that she cannot bear weight , cap refill, sensation present equal.

## 2011-04-23 NOTE — Discharge Instructions (Signed)
Contusion  A contusion is a deep bruise. Contusions happen when an injury causes bleeding under the skin. Signs of bruising include pain, puffiness (swelling), and discolored skin. The contusion may turn blue, purple, or yellow.  HOME CARE    Put ice on the injured area.   Put ice in a plastic bag.   Place a towel between your skin and the bag.   Leave the ice on for 15 to 20 minutes, 3 to 4 times a day.   Only take medicine as told by your doctor.   Rest the injured area.   If possible, raise (elevate) the injured area to lessen puffiness.  GET HELP RIGHT AWAY IF:    You have more bruising or puffiness.   You have pain that is getting worse.   Your puffiness or pain is not helped by medicine.  MAKE SURE YOU:    Understand these instructions.   Will watch your condition.   Will get help right away if you are not doing well or get worse.  Document Released: 07/19/2007 Document Revised: 01/19/2011 Document Reviewed: 12/05/2010  [employer] Patient Information 2012 [employer].

## 2011-04-25 NOTE — ED Provider Notes (Signed)
Medical screening examination/treatment/procedure(s) were performed by non-physician practitioner and as supervising physician I was immediately available for consultation/collaboration.   Anita Laguna, MD 04/25/11 1117 

## 2011-06-02 ENCOUNTER — Other Ambulatory Visit (HOSPITAL_COMMUNITY)
Admission: RE | Admit: 2011-06-02 | Discharge: 2011-06-02 | Disposition: A | Discharge status: Home / Self Care | Payer: BC Managed Care – PPO | Source: Ambulatory Visit | Attending: Family Medicine | Admitting: Family Medicine

## 2011-06-02 ENCOUNTER — Ambulatory Visit (INDEPENDENT_AMBULATORY_CARE_PROVIDER_SITE_OTHER): Payer: BC Managed Care – PPO | Admitting: Family Medicine

## 2011-06-02 ENCOUNTER — Encounter: Payer: Self-pay | Admitting: Family Medicine

## 2011-06-02 VITALS — BP 114/73 | HR 86 | Temp 97.5°F | Ht 64.0 in | Wt 158.1 lb

## 2011-06-02 DIAGNOSIS — N760 Acute vaginitis: Secondary | ICD-10-CM | POA: Insufficient documentation

## 2011-06-02 HISTORY — DX: Acute vaginitis: N76.0

## 2011-06-02 MED ORDER — FLUCONAZOLE 150 MG PO TABS: ORAL_TABLET | ORAL | Status: DC

## 2011-06-02 NOTE — Progress Notes (Signed)
Patient ID: Madeline Murphy, female   DOB: 01-25-1976, 36 y.o.   MRN: [id number] Madeline Murphy [id number] 1975/06/02 06/02/2011      Progress Note-Follow Up  Subjective  Chief Complaint  Chief Complaint  Patient presents with  . bacterial infection    HPI  Patient is a 36 year old Caucasian female who is in today complaining of recurrent vaginitis. Her ear she's had trouble with yeast and bacteria. Symptoms started in the last 24 hours. She is vaginal discharge is thick slightly low risk. As causing vaginal itching. Monistat generally does work for her. Benzocaine helps temporarily. No fevers, chills, urinary symptoms, GI symptoms, abdominal or back pain. No change in partners or other concerns are noted.  Past Medical History  Diagnosis Date  . Bronchitis 01/30/2011  . Vaginitis 06/02/2011    Past Surgical History  Procedure Date  . Chronc compartmenal syndrome     S/P ortho surgery x3    Family History  Problem Relation Age of Onset  . Coronary artery disease    . Hypertension    . Hyperlipidemia Mother   . Heart disease Mother     palpitation  . Heart disease Father     s/p bypass and stent  . Hyperlipidemia Father   . Hypertension Father   . Otitis media Daughter     recurrent strep  . Cancer Maternal Aunt     breast  . Cancer Maternal Grandfather     possibly stomach  . Aneurysm Paternal Grandmother     likely brain  . Cancer Paternal Grandmother     breast  . Otitis media Daughter     History   Social History  . Marital Status: Married    Spouse Name: N/A    Number of Children: N/A  . Years of Education: N/A   Occupational History  . Not on file.   Social History Main Topics  . Smoking status: Never Smoker   . Smokeless tobacco: Never Used  . Alcohol Use: Yes  . Drug Use: No  . Sexually Active: Yes    Birth Control/ Protection: Pill   Other Topics Concern  . Not on file   Social History Narrative  . No narrative on file    Current Outpatient  Prescriptions on File Prior to Visit  Medication Sig Dispense Refill  . ethynodiol-ethinyl estradiol (ZOVIA) 1-50 MG-MCG tablet Take 1 tablet by mouth daily.        Marland Kitchen FIBER SELECT GUMMIES CHEW Chew 2 each by mouth daily.      Marland Kitchen ibuprofen (ADVIL,MOTRIN) 200 MG tablet Take 400 mg by mouth once as needed. For pain       . tetrahydrozoline (VISINE) 0.05 % ophthalmic solution Place 1 drop into both eyes every morning.      Marland Kitchen DISCONTD: albuterol (VENTOLIN HFA) 108 (90 BASE) MCG/ACT inhaler Inhale 2 puffs into the lungs every 6 (six) hours as needed for wheezing.  1 Inhaler  0  . DISCONTD: Almotriptan Malate (AXERT PO) Take by mouth as needed.        Marland Kitchen DISCONTD: fluticasone (FLONASE) 50 MCG/ACT nasal spray Place 1 spray into the nose 2 (two) times daily.  16 g  1  . DISCONTD: Mometasone Furo-Formoterol Fum 200-5 MCG/ACT AERO 1-2  inhalations every 12 hours; gargle and spit after use  8.8 g  0  . DISCONTD: topiramate (TOPAMAX) 50 MG tablet Take by mouth 2 (two) times daily. Take 3 tab  At bedtime for migraines  Allergies  Allergen Reactions  . Codeine Nausea Only    Syncope     Review of Systems  Review of Systems  Constitutional: Negative for fever and malaise/fatigue.  Respiratory: Negative for shortness of breath.   Cardiovascular: Negative for chest pain.  Gastrointestinal: Negative for nausea, abdominal pain and diarrhea.  Genitourinary: Negative for dysuria, urgency and frequency.  Neurological: Negative for loss of consciousness and headaches.  Endo/Heme/Allergies: Negative for polydipsia.  Psychiatric/Behavioral: Negative for depression. The patient is not nervous/anxious and does not have insomnia.     Objective  BP 114/73  Pulse 86  Temp(Src) 97.5 F (36.4 C) (Temporal)  Ht 5\' 4"  (1.626 m)  Wt 158 lb 1.9 oz (71.723 kg)  BMI 27.14 kg/m2  SpO2 97%  LMP 05/16/2011  Physical Exam  Physical Exam  Constitutional: She is oriented to person, place, and time and  well-developed, well-nourished, and in no distress. No distress.  HENT:  Head: Normocephalic and atraumatic.  Eyes: Conjunctivae are normal.  Neck: Neck supple. No thyromegaly present.  Cardiovascular: Normal rate, regular rhythm and normal heart sounds.   No murmur heard. Pulmonary/Chest: Effort normal and breath sounds normal. She has no wheezes.  Abdominal: She exhibits no distension and no mass.  Genitourinary: Uterus normal, cervix normal, right adnexa normal and left adnexa normal. Vaginal discharge found.       Thick white vaginal discharge  Musculoskeletal: She exhibits no edema.  Lymphadenopathy:    She has no cervical adenopathy.  Neurological: She is alert and oriented to person, place, and time.  Skin: Skin is warm and dry. No rash noted. She is not diaphoretic.  Psychiatric: Memory, affect and judgment normal.    Lab Results  Component Value Date   TSH 0.958 01/26/2011   Lab Results  Component Value Date   WBC 12.5* 02/02/2011   HGB 13.8 02/02/2011   HCT 42.6 02/02/2011   MCV 81.3 02/02/2011   PLT 231 02/02/2011   Lab Results  Component Value Date   CREATININE 0.78 02/02/2011   BUN 7 02/02/2011   NA 137 02/02/2011   K 3.8 02/02/2011   CL 101 02/02/2011   CO2 27 02/02/2011   Lab Results  Component Value Date   ALT 14 02/02/2011   AST 14 02/02/2011   ALKPHOS 54 02/02/2011   BILITOT 0.6 02/02/2011   Lab Results  Component Value Date   CHOL 168 10/25/2006   Lab Results  Component Value Date   HDL 42.0 10/25/2006   Lab Results  Component Value Date   LDLCALC 100* 10/25/2006   Lab Results  Component Value Date   TRIG 128 10/25/2006   Lab Results  Component Value Date   CHOLHDL 4.0 CALC 10/25/2006     Assessment & Plan  Vaginitis Patient struggles with recurrent yeast. Monostat not helpful. Encouraged cotton, no heavy soaps, given a prescription for diflucan and cultures sent for eval. May use Vinegar and Witch Hazel topically as needed and  instructed

## 2011-06-02 NOTE — Patient Instructions (Signed)
Vaginitis Vaginitis is an infection. It causes soreness, swelling, and redness (inflammation) of the vagina. Many of these infections are sexually transmitted diseases (STDs). Having unprotected sex can cause further problems and complications such as:  Chronic pelvic pain.   Infertility.   Unwanted pregnancy.   Abortion.   Tubal pregnancy.   Infection passed on to the newborn.   Cancer.  CAUSES   Monilia. This is a yeast or fungus infection, not an STD.   Bacterial vaginosis. The normal balance of bacteria in the vagina is disrupted and is replaced by an overgrowth of certain bacteria.   Gonorrhea, chlamydia. These are bacterial infections that are STDs.   Vaginal sponges, diaphragms, and intrauterine devices.   Trichomoniasis. This is a STD infection caused by a parasite.   Viruses like herpes and human papillomavirus. Both are STDs.   Pregnancy.   Immunosuppression. This occurs with certain conditions such as HIV infection or cancer.   Using bubble bath.   Taking certain antibiotic medicines.   Sporadic recurrence can occur if you become sick.   Diabetes.   Steroids.   Allergic reaction. If you have an allergy to:   Douches.   Soaps.   Spermicides.   Condoms.   Scented tampons or vaginal sprays.  SYMPTOMS   Abnormal vaginal discharge.   Itching of the vagina.   Pain in the vagina.   Swelling of the vagina.  In some cases, there are no symptoms. TREATMENT  Treatment will vary depending on the type of infection.  Bacteria or trichomonas are usually treated with oral antibiotics and sometimes vaginal cream or suppositories.   Monilia vaginitis is usually treated with vaginal creams, suppositories, or oral antifungal pills.   Viral vaginitis has no cure. However, the symptoms of herpes (a viral vaginitis) can be treated to relieve the discomfort. Human papillomavirus has no symptoms. However, there are treatments for the diseases caused by human  papillomavirus.   With allergic vaginitis, you need to stop using the product that is causing the problem. Vaginal creams can be used to treat the symptoms.   When treating an STD, the sex partner should also be treated.  HOME CARE INSTRUCTIONS   Take all the medicines as directed by your caregiver.   Do not use scented tampons, soaps, or vaginal sprays.   Do not douche.   Tell your sex partner if you have a vaginal infection or an STD.   Do not have sexual intercourse until you have treated the vaginitis.   Practice safe sex by using condoms.  SEEK MEDICAL CARE IF:   You have abdominal pain.   Your symptoms get worse during treatment.    Add a probiotic daily  Use Distilled white vinegar and Witch Hazel astringent as directed. Bloiw dry, cotton undergarments Document Released: 11/27/2006 Document Revised: 01/19/2011 Document Reviewed: 07/23/2008 [facility] Patient Information 2012 [place], [place].

## 2011-06-02 NOTE — Assessment & Plan Note (Signed)
Patient struggles with recurrent yeast. Monostat not helpful. Encouraged cotton, no heavy soaps, given a prescription for diflucan and cultures sent for eval. May use Vinegar and Witch Hazel topically as needed and instructed

## 2011-08-24 ENCOUNTER — Other Ambulatory Visit: Payer: Self-pay | Admitting: Obstetrics and Gynecology

## 2012-02-19 ENCOUNTER — Encounter: Payer: Self-pay | Admitting: Family Medicine

## 2012-02-19 ENCOUNTER — Ambulatory Visit (INDEPENDENT_AMBULATORY_CARE_PROVIDER_SITE_OTHER): Payer: BC Managed Care – PPO | Admitting: Family Medicine

## 2012-02-19 VITALS — BP 116/83 | HR 78 | Temp 98.5°F | Ht 64.0 in | Wt 147.0 lb

## 2012-02-19 DIAGNOSIS — J329 Chronic sinusitis, unspecified: Secondary | ICD-10-CM

## 2012-02-19 DIAGNOSIS — J020 Streptococcal pharyngitis: Secondary | ICD-10-CM | POA: Insufficient documentation

## 2012-02-19 HISTORY — DX: Chronic sinusitis, unspecified: J32.9

## 2012-02-19 MED ORDER — GUAIFENESIN ER 600 MG PO TB12: 600.0000 mg | ORAL_TABLET | Freq: Two times a day (BID) | ORAL | Status: DC

## 2012-02-19 MED ORDER — AZITHROMYCIN 250 MG PO TABS: ORAL_TABLET | ORAL | Status: DC

## 2012-02-19 MED ORDER — HYDROCOD POLST-CHLORPHEN POLST 10-8 MG/5ML PO LQCR: 5.0000 mL | Freq: Every evening | ORAL | Status: DC | PRN

## 2012-02-19 NOTE — Patient Instructions (Addendum)
[employer] gummies by [employer]    Sinusitis Sinusitis is redness, soreness, and swelling (inflammation) of the paranasal sinuses. Paranasal sinuses are air pockets within the bones of your face (beneath the eyes, the middle of the forehead, or above the eyes). In healthy paranasal sinuses, mucus is able to drain out, and air is able to circulate through them by way of your nose. However, when your paranasal sinuses are inflamed, mucus and air can become trapped. This can allow bacteria and other germs to grow and cause infection. Sinusitis can develop quickly and last only a short time (acute) or continue over a long period (chronic). Sinusitis that lasts for more than 12 weeks is considered chronic.  CAUSES  Causes of sinusitis include:  Allergies.  Structural abnormalities, such as displacement of the cartilage that separates your nostrils (deviated septum), which can decrease the air flow through your nose and sinuses and affect sinus drainage.  Functional abnormalities, such as when the small hairs (cilia) that line your sinuses and help remove mucus do not work properly or are not present. SYMPTOMS  Symptoms of acute and chronic sinusitis are the same. The primary symptoms are pain and pressure around the affected sinuses. Other symptoms include:  Upper toothache.  Earache.  Headache.  Bad breath.  Decreased sense of smell and taste.  A cough, which worsens when you are lying flat.  Fatigue.  Fever.  Thick drainage from your nose, which often is green and may contain pus (purulent).  Swelling and warmth over the affected sinuses. DIAGNOSIS  Your caregiver will perform a physical exam. During the exam, your caregiver may:  Look in your nose for signs of abnormal growths in your nostrils (nasal polyps).  Tap over the affected sinus to check for signs of infection.  View the inside of your sinuses (endoscopy) with a special imaging device with a light attached  (endoscope), which is inserted into your sinuses. If your caregiver suspects that you have chronic sinusitis, one or more of the following tests may be recommended:  Allergy tests.  Nasal culture A sample of mucus is taken from your nose and sent to a lab and screened for bacteria.  Nasal cytology A sample of mucus is taken from your nose and examined by your caregiver to determine if your sinusitis is related to an allergy. TREATMENT  Most cases of acute sinusitis are related to a viral infection and will resolve on their own within 10 days. Sometimes medicines are prescribed to help relieve symptoms (pain medicine, decongestants, nasal steroid sprays, or saline sprays).  However, for sinusitis related to a bacterial infection, your caregiver will prescribe antibiotic medicines. These are medicines that will help kill the bacteria causing the infection.  Rarely, sinusitis is caused by a fungal infection. In theses cases, your caregiver will prescribe antifungal medicine. For some cases of chronic sinusitis, surgery is needed. Generally, these are cases in which sinusitis recurs more than 3 times per year, despite other treatments. HOME CARE INSTRUCTIONS   Drink plenty of water. Water helps thin the mucus so your sinuses can drain more easily.  Use a humidifier.  Inhale steam 3 to 4 times a day (for example, sit in the bathroom with the shower running).  Apply a warm, moist washcloth to your face 3 to 4 times a day, or as directed by your caregiver.  Use saline nasal sprays to help moisten and clean your sinuses.  Take over-the-counter or prescription medicines for pain, discomfort, or fever only  as directed by your caregiver. SEEK IMMEDIATE MEDICAL CARE IF:  You have increasing pain or severe headaches.  You have nausea, vomiting, or drowsiness.  You have swelling around your face.  You have vision problems.  You have a stiff neck.  You have difficulty breathing. MAKE SURE  YOU:   Understand these instructions.  Will watch your condition.  Will get help right away if you are not doing well or get worse. Document Released: 01/30/2005 Document Revised: 04/24/2011 Document Reviewed: 02/14/2011 [facility] Patient Information 2013 [place], [place].

## 2012-02-19 NOTE — Assessment & Plan Note (Signed)
Add Azithromycin and Mucinex, probiotics, and increase rest and fluids

## 2012-02-19 NOTE — Progress Notes (Signed)
Patient ID: Madeline Murphy, female   DOB: 16-Sep-1975, 37 y.o.   MRN: [id number] Madeline Murphy [id number] 05-03-1975 02/19/2012      Progress Note-Follow Up  Subjective  Chief Complaint  Chief Complaint  Patient presents with  . Cough    w/ sneezing, congestion, jaw hurts, pressure in eyes X  days    HPI  Patient is a 37 yo Caucasian female in today for evaluation of persistent respiratory symptoms. She says she started on Augmentin on December 31 for sore throat cough congestion. Cough is productive of brown phlegm. Notes since starting the Augmentin the plan has turned from brown and yellow to clear. Her throat feels better and her cough still persists and is slightly improved. She is coughing at times especially at night to the point of feeling nauseous and he began eating. No chest pain or palpitations. No GI disturbance. Does note a lot of sinus pressure and head pressure as well and some left ear greater than right ear pain at the present time.  Past Medical History  Diagnosis Date  . Bronchitis 01/30/2011  . Vaginitis 06/02/2011  . Sinusitis 02/19/2012    Past Surgical History  Procedure Date  . Chronc compartmenal syndrome     S/P ortho surgery x3    Family History  Problem Relation Age of Onset  . Coronary artery disease    . Hypertension    . Hyperlipidemia Mother   . Heart disease Mother     palpitation  . Heart disease Father     s/p bypass and stent  . Hyperlipidemia Father   . Hypertension Father   . Otitis media Daughter     recurrent strep  . Cancer Maternal Aunt     breast  . Cancer Maternal Grandfather     possibly stomach  . Aneurysm Paternal Grandmother     likely brain  . Cancer Paternal Grandmother     breast  . Otitis media Daughter     History   Social History  . Marital Status: Married    Spouse Name: N/A    Number of Children: N/A  . Years of Education: N/A   Occupational History  . Not on file.   Social History Main Topics  . Smoking  status: Never Smoker   . Smokeless tobacco: Never Used  . Alcohol Use: Yes  . Drug Use: No  . Sexually Active: Yes    Birth Control/ Protection: Pill   Other Topics Concern  . Not on file   Social History Narrative  . No narrative on file    Current Outpatient Prescriptions on File Prior to Visit  Medication Sig Dispense Refill  . Calcium Citrate (CITRUS CALCIUM PO) Take 400 mg by mouth daily.      Marland Kitchen ethynodiol-ethinyl estradiol (ZOVIA) 1-50 MG-MCG tablet Take 1 tablet by mouth daily.        Marland Kitchen ibuprofen (ADVIL,MOTRIN) 200 MG tablet Take 400 mg by mouth once as needed. For pain       . tetrahydrozoline (VISINE) 0.05 % ophthalmic solution Place 1 drop into both eyes every morning.      . [DISCONTINUED] albuterol (VENTOLIN HFA) 108 (90 BASE) MCG/ACT inhaler Inhale 2 puffs into the lungs every 6 (six) hours as needed for wheezing.  1 Inhaler  0  . [DISCONTINUED] Almotriptan Malate (AXERT PO) Take by mouth as needed.        . [DISCONTINUED] fluticasone (FLONASE) 50 MCG/ACT nasal spray Place 1 spray into the nose 2 (  two) times daily.  16 g  1  . [DISCONTINUED] Mometasone Furo-Formoterol Fum 200-5 MCG/ACT AERO 1-2  inhalations every 12 hours; gargle and spit after use  8.8 g  0  . [DISCONTINUED] topiramate (TOPAMAX) 50 MG tablet Take by mouth 2 (two) times daily. Take 3 tab  At bedtime for migraines         Allergies  Allergen Reactions  . Codeine Nausea Only    Syncope     Review of Systems  Review of Systems  Constitutional: Positive for malaise/fatigue. Negative for fever.  HENT: Positive for ear pain, congestion and sore throat.   Eyes: Negative for discharge.  Respiratory: Positive for cough and sputum production. Negative for shortness of breath and wheezing.   Cardiovascular: Negative for chest pain, palpitations and leg swelling.  Gastrointestinal: Positive for nausea. Negative for vomiting, abdominal pain and diarrhea.  Genitourinary: Negative for dysuria.    Musculoskeletal: Negative for falls.  Skin: Negative for rash.  Neurological: Positive for headaches. Negative for loss of consciousness.  Endo/Heme/Allergies: Negative for polydipsia.  Psychiatric/Behavioral: Negative for depression and suicidal ideas. The patient is not nervous/anxious and does not have insomnia.     Objective  BP 116/83  Pulse 78  Temp 98.5 F (36.9 C) (Temporal)  Ht 5\' 4"  (1.626 m)  Wt 147 lb (66.679 kg)  BMI 25.23 kg/m2  SpO2 99%  LMP 01/15/2012  Physical Exam  Physical Exam  Constitutional: She is oriented to person, place, and time and well-developed, well-nourished, and in no distress. No distress.  HENT:  Head: Normocephalic and atraumatic.  Eyes: Conjunctivae normal are normal.  Neck: Neck supple. No thyromegaly present.  Cardiovascular: Normal rate, regular rhythm and normal heart sounds.   No murmur heard. Pulmonary/Chest: Effort normal. She has no wheezes.       Rhonchi RUL cleared with coughing  Abdominal: She exhibits no distension and no mass.  Musculoskeletal: She exhibits no edema.  Lymphadenopathy:    She has no cervical adenopathy.  Neurological: She is alert and oriented to person, place, and time.  Skin: Skin is warm and dry. No rash noted. She is not diaphoretic.  Psychiatric: Memory, affect and judgment normal.    Lab Results  Component Value Date   TSH 0.958 01/26/2011   Lab Results  Component Value Date   WBC 12.5* 02/02/2011   HGB 13.8 02/02/2011   HCT 42.6 02/02/2011   MCV 81.3 02/02/2011   PLT 231 02/02/2011   Lab Results  Component Value Date   CREATININE 0.78 02/02/2011   BUN 7 02/02/2011   NA 137 02/02/2011   K 3.8 02/02/2011   CL 101 02/02/2011   CO2 27 02/02/2011   Lab Results  Component Value Date   ALT 14 02/02/2011   AST 14 02/02/2011   ALKPHOS 54 02/02/2011   BILITOT 0.6 02/02/2011   Lab Results  Component Value Date   CHOL 168 10/25/2006   Lab Results  Component Value Date   HDL 42.0  10/25/2006   Lab Results  Component Value Date   LDLCALC 100* 10/25/2006   Lab Results  Component Value Date   TRIG 128 10/25/2006   Lab Results  Component Value Date   CHOLHDL 4.0 CALC 10/25/2006     Assessment & Plan  Sinusitis Add Azithromycin and Mucinex, probiotics, and increase rest and fluids

## 2012-05-29 ENCOUNTER — Encounter: Payer: Self-pay | Admitting: Family Medicine

## 2012-05-29 ENCOUNTER — Ambulatory Visit (INDEPENDENT_AMBULATORY_CARE_PROVIDER_SITE_OTHER): Payer: BC Managed Care – PPO | Admitting: Family Medicine

## 2012-05-29 VITALS — BP 122/82 | HR 64 | Temp 97.8°F | Ht 64.0 in | Wt 160.0 lb

## 2012-05-29 DIAGNOSIS — F419 Anxiety disorder, unspecified: Secondary | ICD-10-CM

## 2012-05-29 DIAGNOSIS — J029 Acute pharyngitis, unspecified: Secondary | ICD-10-CM

## 2012-05-29 DIAGNOSIS — E663 Overweight: Secondary | ICD-10-CM

## 2012-05-29 DIAGNOSIS — J329 Chronic sinusitis, unspecified: Secondary | ICD-10-CM

## 2012-05-29 DIAGNOSIS — Z0000 Encounter for general adult medical examination without abnormal findings: Secondary | ICD-10-CM

## 2012-05-29 DIAGNOSIS — F341 Dysthymic disorder: Secondary | ICD-10-CM

## 2012-05-29 LAB — LIPID PANEL
Cholesterol: 217 mg/dL — ABNORMAL HIGH (ref 0–200)
Triglycerides: 147 mg/dL (ref 0.0–149.0)

## 2012-05-29 LAB — CBC
Hemoglobin: 13.5 g/dL (ref 12.0–15.0)
Platelets: 291 10*3/uL (ref 150.0–400.0)
RBC: 5.03 Mil/uL (ref 3.87–5.11)
WBC: 6.6 10*3/uL (ref 4.5–10.5)

## 2012-05-29 LAB — T4, FREE: Free T4: 0.7 ng/dL (ref 0.60–1.60)

## 2012-05-29 LAB — HEPATIC FUNCTION PANEL
ALT: 26 U/L (ref 0–35)
Total Protein: 7.6 g/dL (ref 6.0–8.3)

## 2012-05-29 LAB — RENAL FUNCTION PANEL
Albumin: 4 g/dL (ref 3.5–5.2)
Calcium: 9.4 mg/dL (ref 8.4–10.5)
Creatinine, Ser: 0.9 mg/dL (ref 0.4–1.2)
Glucose, Bld: 95 mg/dL (ref 70–99)
Sodium: 136 mEq/L (ref 135–145)

## 2012-05-29 LAB — TSH: TSH: 1.52 u[IU]/mL (ref 0.35–5.50)

## 2012-05-29 MED ORDER — HYDROCOD POLST-CHLORPHEN POLST 10-8 MG/5ML PO LQCR: 5.0000 mL | Freq: Every evening | ORAL | Status: DC | PRN

## 2012-05-29 MED ORDER — ESCITALOPRAM OXALATE 10 MG PO TABS: 10.0000 mg | ORAL_TABLET | Freq: Every day | ORAL | Status: DC

## 2012-05-29 MED ORDER — HYDROCODONE-HOMATROPINE 5-1.5 MG/5ML PO SYRP: 5.0000 mL | ORAL_SOLUTION | Freq: Three times a day (TID) | ORAL | Status: DC | PRN

## 2012-05-29 MED ORDER — ALPRAZOLAM 0.25 MG PO TABS: 0.2500 mg | ORAL_TABLET | Freq: Two times a day (BID) | ORAL | Status: DC | PRN

## 2012-05-29 NOTE — Patient Instructions (Addendum)

## 2012-06-02 ENCOUNTER — Encounter: Payer: Self-pay | Admitting: Family Medicine

## 2012-06-02 DIAGNOSIS — E663 Overweight: Secondary | ICD-10-CM

## 2012-06-02 HISTORY — DX: Overweight: E66.3

## 2012-06-02 NOTE — Assessment & Plan Note (Signed)
Encouraged DASH diet, 8 hours of sleep, regular exercise.  

## 2012-06-02 NOTE — Assessment & Plan Note (Signed)
Finishing course of PCN, encouraged probiotics.

## 2012-06-02 NOTE — Progress Notes (Signed)
Patient ID: Madeline Murphy, female   DOB: 1975/08/12, 37 y.o.   MRN: [id number] Madeline Murphy [id number] [birth year] 06/02/2012      Progress Note-Follow Up  Subjective  Chief Complaint  Chief Complaint  Patient presents with  . Depression  . check thyroid/ weight    HPI  Patient is a 37 year old caucasian female in today for sore throat and frustration with weight gain. She is trying to maintain a very strict diet with calorie restriction. Still gaining weight. Exercising also. Is taking PCN for strep throat and symptoms are improving. No fevers, chills at this time. No cp, palp, sob, gi ro gu c/o  Past Medical History  Diagnosis Date  . Bronchitis 01/30/2011  . Vaginitis 06/02/2011  . Sinusitis 02/19/2012  . Strep pharyngitis 02/19/2012  . Overweight 06/02/2012    Past Surgical History  Procedure Laterality Date  . Chronc compartmenal syndrome      S/P ortho surgery x3    Family History  Problem Relation Age of Onset  . Coronary artery disease    . Hypertension    . Hyperlipidemia Mother   . Heart disease Mother     palpitation  . Heart disease Father     s/p bypass and stent  . Hyperlipidemia Father   . Hypertension Father   . Otitis media Daughter     recurrent strep  . Cancer Maternal Aunt     breast  . Cancer Maternal Grandfather     possibly stomach  . Aneurysm Paternal Grandmother     likely brain  . Cancer Paternal Grandmother     breast  . Otitis media Daughter     History   Social History  . Marital Status: Married    Spouse Name: N/A    Number of Children: N/A  . Years of Education: N/A   Occupational History  . Not on file.   Social History Main Topics  . Smoking status: Never Smoker   . Smokeless tobacco: Never Used  . Alcohol Use: Yes  . Drug Use: No  . Sexually Active: Yes    Birth Control/ Protection: Pill   Other Topics Concern  . Not on file   Social History Narrative  . No narrative on file    Current Outpatient Prescriptions on  File Prior to Visit  Medication Sig Dispense Refill  . Calcium Citrate (CITRUS CALCIUM PO) Take 400 mg by mouth daily.      Marland Kitchen ethynodiol-ethinyl estradiol (ZOVIA) 1-50 MG-MCG tablet Take 1 tablet by mouth daily.        Marland Kitchen ibuprofen (ADVIL,MOTRIN) 200 MG tablet Take 400 mg by mouth once as needed. For pain       . MAGNESIUM PO Take 2 capsules by mouth 2 (two) times daily.      Marland Kitchen VITAMIN D, CHOLECALCIFEROL, PO Take 1 tablet by mouth daily. Nordic berries      . [DISCONTINUED] albuterol (VENTOLIN HFA) 108 (90 BASE) MCG/ACT inhaler Inhale 2 puffs into the lungs every 6 (six) hours as needed for wheezing.  1 Inhaler  0  . [DISCONTINUED] Almotriptan Malate (AXERT PO) Take by mouth as needed.        . [DISCONTINUED] fluticasone (FLONASE) 50 MCG/ACT nasal spray Place 1 spray into the nose 2 (two) times daily.  16 g  1  . [DISCONTINUED] Mometasone Furo-Formoterol Fum 200-5 MCG/ACT AERO 1-2  inhalations every 12 hours; gargle and spit after use  8.8 g  0  . [DISCONTINUED] topiramate (TOPAMAX)  50 MG tablet Take by mouth 2 (two) times daily. Take 3 tab  At bedtime for migraines        No current facility-administered medications on file prior to visit.    Allergies  Allergen Reactions  . Codeine Nausea Only    Syncope     Review of Systems  Review of Systems  Constitutional: Negative for fever and malaise/fatigue.  HENT: Positive for congestion and sore throat.   Eyes: Negative for discharge.  Respiratory: Negative for shortness of breath.   Cardiovascular: Negative for chest pain, palpitations and leg swelling.  Gastrointestinal: Negative for nausea, abdominal pain and diarrhea.  Genitourinary: Negative for dysuria.  Musculoskeletal: Negative for falls.  Skin: Negative for rash.  Neurological: Negative for loss of consciousness and headaches.  Endo/Heme/Allergies: Negative for polydipsia.  Psychiatric/Behavioral: Negative for depression and suicidal ideas. The patient is not nervous/anxious  and does not have insomnia.     Objective  BP 122/82  Pulse 64  Temp(Src) 97.8 F (36.6 C) (Temporal)  Ht 5\' 4"  (1.626 m)  Wt 160 lb (72.576 kg)  BMI 27.45 kg/m2  SpO2 98%  LMP 05/21/2012  Physical Exam  Physical Exam  Constitutional: She is oriented to person, place, and time and well-developed, well-nourished, and in no distress. No distress.  HENT:  Head: Normocephalic and atraumatic.  Eyes: Conjunctivae are normal.  Neck: Neck supple. No thyromegaly present.  Cardiovascular: Normal rate, regular rhythm and normal heart sounds.   No murmur heard. Pulmonary/Chest: Effort normal and breath sounds normal. She has no wheezes.  Abdominal: She exhibits no distension and no mass.  Musculoskeletal: She exhibits no edema.  Lymphadenopathy:    She has no cervical adenopathy.  Neurological: She is alert and oriented to person, place, and time.  Skin: Skin is warm and dry. No rash noted. She is not diaphoretic.  Psychiatric: Memory, affect and judgment normal.    Lab Results  Component Value Date   TSH 1.52 05/29/2012   Lab Results  Component Value Date   WBC 6.6 05/29/2012   HGB 13.5 05/29/2012   HCT 40.4 05/29/2012   MCV 80.3 05/29/2012   PLT 291.0 05/29/2012   Lab Results  Component Value Date   CREATININE 0.9 05/29/2012   BUN 10 05/29/2012   NA 136 05/29/2012   K 4.2 05/29/2012   CL 100 05/29/2012   CO2 29 05/29/2012   Lab Results  Component Value Date   ALT 26 05/29/2012   AST 28 05/29/2012   ALKPHOS 59 05/29/2012   BILITOT 0.6 05/29/2012   Lab Results  Component Value Date   CHOL 217* 05/29/2012   Lab Results  Component Value Date   HDL 52.50 05/29/2012   Lab Results  Component Value Date   LDLCALC 100* 10/25/2006   Lab Results  Component Value Date   TRIG 147.0 05/29/2012   Lab Results  Component Value Date   CHOLHDL 4 05/29/2012     Assessment & Plan  Strep pharyngitis Finishing course of PCN, encouraged probiotics.   Overweight Encouraged DASH  diet, 8 hours of sleep, regular exercise

## 2012-06-25 ENCOUNTER — Other Ambulatory Visit: Payer: Self-pay | Admitting: Family Medicine

## 2012-06-25 NOTE — Telephone Encounter (Signed)
Please advise refill request? Last RX was wrote on 05-29-12 quantity 5 with 0 refills  If ok fax to [phone]

## 2012-07-31 ENCOUNTER — Telehealth: Payer: Self-pay | Admitting: Family Medicine

## 2012-07-31 DIAGNOSIS — J329 Chronic sinusitis, unspecified: Secondary | ICD-10-CM

## 2012-07-31 DIAGNOSIS — F419 Anxiety disorder, unspecified: Secondary | ICD-10-CM

## 2012-07-31 MED ORDER — HYDROCODONE-HOMATROPINE 5-1.5 MG/5ML PO SYRP: 5.0000 mL | ORAL_SOLUTION | Freq: Three times a day (TID) | ORAL | Status: DC | PRN

## 2012-07-31 MED ORDER — ESCITALOPRAM OXALATE 10 MG PO TABS: 10.0000 mg | ORAL_TABLET | Freq: Every day | ORAL | Status: DC

## 2012-07-31 NOTE — Telephone Encounter (Signed)
I refilled the Lexapro. Please advise the Hydrocodone syrup? Last RX was done on 05-29-12 quantity 140 ml X 1  If ok fax to [phone]

## 2012-07-31 NOTE — Telephone Encounter (Signed)
She can have one more bottle of the cough syrup 140 ml, sme sig but no refills if she still needs it after that she needs to come in

## 2012-07-31 NOTE — Telephone Encounter (Signed)
Refill- escitalopram 10mg  tablet. Take one tablet(10mg  total) by mouth daily. 1/2 tab daily and titrate up as tolerated. Qty 30 last fill 5.13.14  Refill- hydrocodone-homatropine syrup. Take one teaspoonful by mouth every 8 hours as needed for cough. Last fill 4.16.14

## 2012-08-07 ENCOUNTER — Encounter: Payer: Self-pay | Admitting: Family

## 2012-08-07 ENCOUNTER — Ambulatory Visit (INDEPENDENT_AMBULATORY_CARE_PROVIDER_SITE_OTHER): Payer: BC Managed Care – PPO | Admitting: Family

## 2012-08-07 ENCOUNTER — Telehealth: Payer: Self-pay | Admitting: Family

## 2012-08-07 VITALS — BP 100/68 | HR 62 | Temp 98.6°F | Resp 16 | Wt 158.1 lb

## 2012-08-07 DIAGNOSIS — L989 Disorder of the skin and subcutaneous tissue, unspecified: Secondary | ICD-10-CM

## 2012-08-07 MED ORDER — CEPHALEXIN 500 MG PO CAPS: 500.0000 mg | ORAL_CAPSULE | Freq: Two times a day (BID) | ORAL | Status: AC

## 2012-08-07 MED ORDER — BETAMETHASONE DIPROPIONATE 0.05 % EX CREA: TOPICAL_CREAM | Freq: Two times a day (BID) | CUTANEOUS | Status: DC

## 2012-08-07 NOTE — Patient Instructions (Addendum)
Please call if symptoms worsen or if not improved in 1 week.  

## 2012-08-07 NOTE — Telephone Encounter (Signed)
Please call pt and let her know that after further research, I think that the lesions on her neck may actually be due to molluscum contagiosum.  If it is molluscum contagiosum, then it can be spread by skin to skin contact and sharing of bath towels etc.  I will place a referral to dermatology (pended below for further evaluation).  She can hold off on abx for now unless increased pain/redness.  OK to use cream as needed for itching.

## 2012-08-07 NOTE — Assessment & Plan Note (Signed)
Initial plan at time of visit was to treat empirically with keflex and add some steroid cream for pruritis.  However, after further derm research, I actually think that these lesions may be due to molluscum contagiosum.  Plan referral to derm for definitive diagnosis. See phone note.

## 2012-08-07 NOTE — Progress Notes (Signed)
Subjective:    Patient ID: Madeline Murphy, female    DOB: 06/28/1975, 37 y.o.   MRN: [id number]  HPI  Madeline Murphy is a 37 yr old female who presents today to discuss some lesions that she has had on her neck.  She first noticed a lesion beneath her chin about 2 months ago.  The lesion can be pruritic at times.  She then developed 2 other adjacent lesions.  She reports that her daughter has recently been evaluated for similar lesions though a diagnosis was not determined.   Review of Systems See HPI  Past Medical History  Diagnosis Date  . Bronchitis 01/30/2011  . Vaginitis 06/02/2011  . Sinusitis 02/19/2012  . Strep pharyngitis 02/19/2012  . Overweight(278.02) 06/02/2012    History   Social History  . Marital Status: Married    Spouse Name: N/A    Number of Children: N/A  . Years of Education: N/A   Occupational History  . Not on file.   Social History Main Topics  . Smoking status: Never Smoker   . Smokeless tobacco: Never Used  . Alcohol Use: Yes  . Drug Use: No  . Sexually Active: Yes    Birth Control/ Protection: Pill   Other Topics Concern  . Not on file   Social History Narrative  . No narrative on file    Past Surgical History  Procedure Laterality Date  . Chronc compartmenal syndrome      S/P ortho surgery x3    Family History  Problem Relation Age of Onset  . Coronary artery disease    . Hypertension    . Hyperlipidemia Mother   . Heart disease Mother     palpitation  . Heart disease Father     s/p bypass and stent  . Hyperlipidemia Father   . Hypertension Father   . Otitis media Daughter     recurrent strep  . Cancer Maternal Aunt     breast  . Cancer Maternal Grandfather     possibly stomach  . Aneurysm Paternal Grandmother     likely brain  . Cancer Paternal Grandmother     breast  . Otitis media Daughter     Allergies  Allergen Reactions  . Codeine Nausea Only    Syncope     Current Outpatient Prescriptions on File Prior to Visit    Medication Sig Dispense Refill  . ALPRAZolam (XANAX) 0.25 MG tablet TAKE 1 TABLET BY MOUTH TWICE A DAY AS NEEDED FOR SLEEP OR ANXIETY  10 tablet  0  . Calcium Citrate (CITRUS CALCIUM PO) Take 400 mg by mouth daily.      . chlorpheniramine-HYDROcodone (TUSSIONEX PENNKINETIC ER) 10-8 MG/5ML LQCR Take 5 mLs by mouth at bedtime as needed.  140 mL  1  . escitalopram (LEXAPRO) 10 MG tablet Take 1 tablet (10 mg total) by mouth daily.  30 tablet  1  . ethynodiol-ethinyl estradiol (ZOVIA) 1-50 MG-MCG tablet Take 1 tablet by mouth daily.        Marland Kitchen ibuprofen (ADVIL,MOTRIN) 200 MG tablet Take 400 mg by mouth once as needed. For pain       . MAGNESIUM PO Take 2 capsules by mouth 2 (two) times daily.      . NON FORMULARY Citrus Bioflavenoids 500 mg      . OVER THE COUNTER MEDICATION Nordic berries- multivitamin- 2daily      . VITAMIN D, CHOLECALCIFEROL, PO Take 1 tablet by mouth daily. Nordic berries      . [  DISCONTINUED] albuterol (VENTOLIN HFA) 108 (90 BASE) MCG/ACT inhaler Inhale 2 puffs into the lungs every 6 (six) hours as needed for wheezing.  1 Inhaler  0  . [DISCONTINUED] Almotriptan Malate (AXERT PO) Take by mouth as needed.        . [DISCONTINUED] fluticasone (FLONASE) 50 MCG/ACT nasal spray Place 1 spray into the nose 2 (two) times daily.  16 g  1  . [DISCONTINUED] Mometasone Furo-Formoterol Fum 200-5 MCG/ACT AERO 1-2  inhalations every 12 hours; gargle and spit after use  8.8 g  0  . [DISCONTINUED] topiramate (TOPAMAX) 50 MG tablet Take by mouth 2 (two) times daily. Take 3 tab  At bedtime for migraines        No current facility-administered medications on file prior to visit.    BP 100/68  Pulse 62  Temp(Src) 98.6 F (37 C) (Oral)  Resp 16  Wt 158 lb 1.9 oz (71.723 kg)  BMI 27.13 kg/m2  SpO2 99%       Objective:   Physical Exam  Constitutional: She appears well-developed and well-nourished. No distress.  Neck:    3 flesh colored dome like lesions noted on neck.  Each several  millimeters wide.          Assessment & Plan:

## 2012-08-09 NOTE — Telephone Encounter (Signed)
Notified pt and she voices understanding. 

## 2012-09-04 ENCOUNTER — Other Ambulatory Visit (INDEPENDENT_AMBULATORY_CARE_PROVIDER_SITE_OTHER): Payer: BC Managed Care – PPO

## 2012-09-04 DIAGNOSIS — Z0000 Encounter for general adult medical examination without abnormal findings: Secondary | ICD-10-CM

## 2012-09-04 LAB — CBC
Platelets: 235 10*3/uL (ref 150.0–400.0)
RBC: 4.61 Mil/uL (ref 3.87–5.11)
WBC: 6.4 10*3/uL (ref 4.5–10.5)

## 2012-09-04 LAB — RENAL FUNCTION PANEL
Glucose, Bld: 78 mg/dL (ref 70–99)
Phosphorus: 2.9 mg/dL (ref 2.3–4.6)
Potassium: 4.1 mEq/L (ref 3.5–5.1)
Sodium: 137 mEq/L (ref 135–145)

## 2012-09-04 LAB — HEPATIC FUNCTION PANEL
ALT: 12 U/L (ref 0–35)
Total Bilirubin: 0.3 mg/dL (ref 0.3–1.2)
Total Protein: 6.9 g/dL (ref 6.0–8.3)

## 2012-09-04 LAB — LIPID PANEL
LDL Cholesterol: 89 mg/dL (ref 0–99)
Total CHOL/HDL Ratio: 3

## 2012-09-04 LAB — TSH: TSH: 0.64 u[IU]/mL (ref 0.35–5.50)

## 2012-09-04 NOTE — Progress Notes (Signed)
Labs only

## 2012-09-09 ENCOUNTER — Other Ambulatory Visit: Payer: Self-pay | Admitting: Obstetrics and Gynecology

## 2012-09-10 ENCOUNTER — Encounter: Payer: Self-pay | Admitting: Family Medicine

## 2012-09-10 ENCOUNTER — Ambulatory Visit (INDEPENDENT_AMBULATORY_CARE_PROVIDER_SITE_OTHER): Payer: BC Managed Care – PPO | Admitting: Family Medicine

## 2012-09-10 VITALS — BP 100/80 | HR 57 | Temp 98.3°F | Ht 64.0 in | Wt 158.0 lb

## 2012-09-10 DIAGNOSIS — E785 Hyperlipidemia, unspecified: Secondary | ICD-10-CM

## 2012-09-10 DIAGNOSIS — F32A Depression, unspecified: Secondary | ICD-10-CM

## 2012-09-10 DIAGNOSIS — F329 Major depressive disorder, single episode, unspecified: Secondary | ICD-10-CM

## 2012-09-10 DIAGNOSIS — F341 Dysthymic disorder: Secondary | ICD-10-CM

## 2012-09-10 DIAGNOSIS — F418 Other specified anxiety disorders: Secondary | ICD-10-CM

## 2012-09-10 DIAGNOSIS — G4700 Insomnia, unspecified: Secondary | ICD-10-CM

## 2012-09-10 DIAGNOSIS — G2581 Restless legs syndrome: Secondary | ICD-10-CM

## 2012-09-10 DIAGNOSIS — Z23 Encounter for immunization: Secondary | ICD-10-CM

## 2012-09-10 DIAGNOSIS — E663 Overweight: Secondary | ICD-10-CM

## 2012-09-10 DIAGNOSIS — Z0000 Encounter for general adult medical examination without abnormal findings: Secondary | ICD-10-CM

## 2012-09-10 MED ORDER — ESCITALOPRAM OXALATE 10 MG PO TABS: ORAL_TABLET | ORAL | Status: DC

## 2012-09-10 MED ORDER — ROPINIROLE HCL 0.5 MG PO TABS: ORAL_TABLET | ORAL | Status: DC

## 2012-09-10 MED ORDER — ALPRAZOLAM 0.25 MG PO TABS: 0.2500 mg | ORAL_TABLET | Freq: Two times a day (BID) | ORAL | Status: DC | PRN

## 2012-09-10 MED ORDER — ZOLPIDEM TARTRATE 10 MG PO TABS: ORAL_TABLET | ORAL | Status: DC

## 2012-09-10 NOTE — Patient Instructions (Addendum)
Hydrate, do not skip meals, start Melatonin at bedtime   Restless Legs Syndrome Restless legs syndrome is a movement disorder. It may also be called a sensori-motor disorder.  CAUSES  No one knows what specifically causes restless legs syndrome, but it tends to run in families. It is also more common in people with low iron, in pregnancy, in people who need dialysis, and those with nerve damage (neuropathy).Some medications may make restless legs syndrome worse.Those medications include drugs to treat high blood pressure, some heart conditions, nausea, colds, allergies, and depression. SYMPTOMS Symptoms include uncomfortable sensations in the legs. These leg sensations are worse during periods of inactivity or rest. They are also worse while sitting or lying down. Individuals that have the disorder describe sensations in the legs that feel like:  Pulling.  Drawing.  Crawling.  Worming.  Boring.  Tingling.  Pins and needles.  Prickling.  Pain. The sensations are usually accompanied by an overwhelming urge to move the legs. Sudden muscle jerks may also occur. Movement provides temporary relief from the discomfort. In rare cases, the arms may also be affected. Symptoms may interfere with going to sleep (sleep onset insomnia). Restless legs syndrome may also be related to periodic limb movement disorder (PLMD). PLMD is another more common motor disorder. It also causes interrupted sleep. The symptoms from PLMD usually occur most often when you are awake. TREATMENT  Treatment for restless legs syndrome is symptomatic. This means that the symptoms are treated.   Massage and cold compresses may provide temporary relief.  Walk, stretch, or take a cold or hot bath.  Get regular exercise and a good night's sleep.  Avoid caffeine, alcohol, nicotine, and medications that can make it worse.  Do activities that provide mental stimulation like discussions, needlework, and video games. These  may be helpful if you are not able to walk or stretch. Some medications are effective in relieving the symptoms. However, many of these medications have side effects. Ask your caregiver about medications that may help your symptoms. Correcting iron deficiency may improve symptoms for some patients. Document Released: 01/20/2002 Document Revised: 04/24/2011 Document Reviewed: 04/28/2010 [facility] Patient Information 2014 [place], [place].

## 2012-09-12 ENCOUNTER — Encounter: Payer: Self-pay | Admitting: Family Medicine

## 2012-09-12 DIAGNOSIS — Z0000 Encounter for general adult medical examination without abnormal findings: Secondary | ICD-10-CM | POA: Insufficient documentation

## 2012-09-12 DIAGNOSIS — G2581 Restless legs syndrome: Secondary | ICD-10-CM

## 2012-09-12 DIAGNOSIS — E782 Mixed hyperlipidemia: Secondary | ICD-10-CM | POA: Insufficient documentation

## 2012-09-12 DIAGNOSIS — E785 Hyperlipidemia, unspecified: Secondary | ICD-10-CM

## 2012-09-12 DIAGNOSIS — G4700 Insomnia, unspecified: Secondary | ICD-10-CM

## 2012-09-12 DIAGNOSIS — F418 Other specified anxiety disorders: Secondary | ICD-10-CM

## 2012-09-12 HISTORY — DX: Insomnia, unspecified: G47.00

## 2012-09-12 HISTORY — DX: Restless legs syndrome: G25.81

## 2012-09-12 HISTORY — DX: Other specified anxiety disorders: F41.8

## 2012-09-12 HISTORY — DX: Hyperlipidemia, unspecified: E78.5

## 2012-09-12 NOTE — Assessment & Plan Note (Signed)
ambien prn, discussed good sleep hygiene

## 2012-09-12 NOTE — Assessment & Plan Note (Signed)
Encouraged regular exercise, DASH diet, 8 hours of sleep. Reviewed fasting labs.

## 2012-09-12 NOTE — Assessment & Plan Note (Signed)
Dash diet and increase exercise.

## 2012-09-12 NOTE — Progress Notes (Signed)
Patient ID: Madeline Murphy, female   DOB: [birth year], 37 y.o.   MRN: [id number] Madeline Murphy [id number] April 30, 1975 09/12/2012      Progress Note-Follow Up  Subjective  Chief Complaint  Chief Complaint  Patient presents with  . Annual Exam    physical    HPI  Patient is a 37 year old Caucasian female who is in today with multiple complaints. She's having trouble sleeping for falls. She does the sertraline is helping her mood. She struggling with this leg symptoms having trouble getting comfortable. Has tried some herbal preparations without relief. no recent illness. No fevers or chills. No chest pain, palpitations, shortness of breath, GI or GU concerns noted.  Past Medical History  Diagnosis Date  . Bronchitis 01/30/2011  . Vaginitis 06/02/2011  . Sinusitis 02/19/2012  . Strep pharyngitis 02/19/2012  . Overweight(278.02) 06/02/2012  . Other and unspecified hyperlipidemia 09/12/2012    Past Surgical History  Procedure Laterality Date  . Chronc compartmenal syndrome      S/P ortho surgery x3    Family History  Problem Relation Age of Onset  . Coronary artery disease    . Hypertension    . Hyperlipidemia Mother   . Heart disease Mother     palpitation  . Heart disease Father     s/p bypass and stent  . Hyperlipidemia Father   . Hypertension Father   . Otitis media Daughter     recurrent strep  . Cancer Maternal Aunt     breast  . Cancer Maternal Grandfather     possibly stomach  . Aneurysm Paternal Grandmother     likely brain  . Cancer Paternal Grandmother     breast  . Otitis media Daughter     History   Social History  . Marital Status: Married    Spouse Name: N/A    Number of Children: N/A  . Years of Education: N/A   Occupational History  . Not on file.   Social History Main Topics  . Smoking status: Never Smoker   . Smokeless tobacco: Never Used  . Alcohol Use: Yes  . Drug Use: No  . Sexually Active: Yes    Birth Control/ Protection: Pill   Other  Topics Concern  . Not on file   Social History Narrative  . No narrative on file    Current Outpatient Prescriptions on File Prior to Visit  Medication Sig Dispense Refill  . chlorpheniramine-HYDROcodone (TUSSIONEX PENNKINETIC ER) 10-8 MG/5ML LQCR Take 5 mLs by mouth at bedtime as needed.  140 mL  1  . ibuprofen (ADVIL,MOTRIN) 200 MG tablet Take 400 mg by mouth once as needed. For pain       . MAGNESIUM PO Take 2 capsules by mouth 2 (two) times daily.      . NON FORMULARY Citrus Bioflavenoids 500 mg      . OVER THE COUNTER MEDICATION Nordic berries- multivitamin- 2daily      . OVER THE COUNTER MEDICATION Take 2 capsules by mouth daily. PROBIOTIC 11      . VITAMIN D, CHOLECALCIFEROL, PO Take 1 tablet by mouth daily. Nordic berries      . [DISCONTINUED] albuterol (VENTOLIN HFA) 108 (90 BASE) MCG/ACT inhaler Inhale 2 puffs into the lungs every 6 (six) hours as needed for wheezing.  1 Inhaler  0  . [DISCONTINUED] Almotriptan Malate (AXERT PO) Take by mouth as needed.        . [DISCONTINUED] fluticasone (FLONASE) 50 MCG/ACT nasal spray Place 1 spray into  the nose 2 (two) times daily.  16 g  1  . [DISCONTINUED] Mometasone Furo-Formoterol Fum 200-5 MCG/ACT AERO 1-2  inhalations every 12 hours; gargle and spit after use  8.8 g  0  . [DISCONTINUED] topiramate (TOPAMAX) 50 MG tablet Take by mouth 2 (two) times daily. Take 3 tab  At bedtime for migraines        No current facility-administered medications on file prior to visit.    Allergies  Allergen Reactions  . Codeine Nausea Only    Syncope     Review of Systems  Review of Systems  Constitutional: Negative for fever and malaise/fatigue.  HENT: Negative for congestion.   Eyes: Negative for pain and discharge.  Respiratory: Negative for shortness of breath.   Cardiovascular: Negative for chest pain, palpitations and leg swelling.  Gastrointestinal: Negative for nausea, abdominal pain and diarrhea.  Genitourinary: Negative for  dysuria.  Musculoskeletal: Positive for myalgias. Negative for falls.  Skin: Negative for rash.  Neurological: Negative for loss of consciousness and headaches.  Endo/Heme/Allergies: Negative for polydipsia.  Psychiatric/Behavioral: Negative for depression and suicidal ideas. The patient has insomnia. The patient is not nervous/anxious.     Objective  BP 100/80  Pulse 57  Temp(Src) 98.3 F (36.8 C) (Oral)  Ht 5\' 4"  (1.626 m)  Wt 158 lb 0.6 oz (71.686 kg)  BMI 27.11 kg/m2  SpO2 98%  LMP 08/11/2012  Physical Exam  Physical Exam  Constitutional: She is oriented to person, place, and time and well-developed, well-nourished, and in no distress. No distress.  HENT:  Head: Normocephalic and atraumatic.  Right Ear: External ear normal.  Left Ear: External ear normal.  Nose: Nose normal.  Mouth/Throat: Oropharynx is clear and moist. No oropharyngeal exudate.  Eyes: Conjunctivae are normal. Pupils are equal, round, and reactive to light. Right eye exhibits no discharge. Left eye exhibits no discharge. No scleral icterus.  Neck: Normal range of motion. Neck supple. No thyromegaly present.  Cardiovascular: Normal rate, regular rhythm and intact distal pulses.  Exam reveals no gallop.   No murmur heard. Pulmonary/Chest: Effort normal and breath sounds normal. No respiratory distress. She has no wheezes. She has no rales.  Abdominal: Soft. Bowel sounds are normal. She exhibits no distension and no mass. There is no tenderness.  Musculoskeletal: Normal range of motion. She exhibits no edema and no tenderness.  Lymphadenopathy:    She has no cervical adenopathy.  Neurological: She is alert and oriented to person, place, and time. She has normal reflexes. No cranial nerve deficit. Coordination normal.  Skin: Skin is warm and dry. No rash noted. She is not diaphoretic.  Psychiatric: Mood, memory and affect normal.    Lab Results  Component Value Date   TSH 0.64 09/04/2012   Lab Results   Component Value Date   WBC 6.4 09/04/2012   HGB 12.8 09/04/2012   HCT 38.1 09/04/2012   MCV 82.7 09/04/2012   PLT 235.0 09/04/2012   Lab Results  Component Value Date   CREATININE 0.8 09/04/2012   BUN 9 09/04/2012   NA 137 09/04/2012   K 4.1 09/04/2012   CL 103 09/04/2012   CO2 27 09/04/2012   Lab Results  Component Value Date   ALT 12 09/04/2012   AST 16 09/04/2012   ALKPHOS 56 09/04/2012   BILITOT 0.3 09/04/2012   Lab Results  Component Value Date   CHOL 174 09/04/2012   Lab Results  Component Value Date   HDL 50.10 09/04/2012  Lab Results  Component Value Date   LDLCALC 89 09/04/2012   Lab Results  Component Value Date   TRIG 174.0* 09/04/2012   Lab Results  Component Value Date   CHOLHDL 3 09/04/2012     Assessment & Plan  Preventative health care Encouraged regular exercise, DASH diet, 8 hours of sleep. Reviewed fasting labs.  Overweight(278.02) Dash diet and increase exercise.  Other and unspecified hyperlipidemia Mild, avoid trans fats, increase exercise and add krill oil caps  RLS (restless legs syndrome) requip prescribed  Depression with anxiety Prescribed Lexapro and Alprazolam  Insomnia ambien prn, discussed good sleep hygiene

## 2012-09-12 NOTE — Assessment & Plan Note (Signed)
Prescribed Lexapro and Alprazolam

## 2012-09-12 NOTE — Assessment & Plan Note (Signed)
requip prescribed

## 2012-09-12 NOTE — Assessment & Plan Note (Signed)
Mild, avoid trans fats, increase exercise and add krill oil caps

## 2012-11-20 ENCOUNTER — Other Ambulatory Visit: Payer: Self-pay | Admitting: Family Medicine

## 2012-11-20 NOTE — Telephone Encounter (Signed)
Rx called to pharmacy voicemail. 

## 2012-11-20 NOTE — Telephone Encounter (Signed)
Pt last seen in July and advised follow up at the end of this month. Last refill of zolpidem was 09/10/12, #30 x 1 refill.  Please advise.

## 2012-11-20 NOTE — Telephone Encounter (Signed)
OK to send 30 tabs zero refills.  

## 2012-12-19 ENCOUNTER — Other Ambulatory Visit: Payer: Self-pay

## 2012-12-27 ENCOUNTER — Other Ambulatory Visit: Payer: Self-pay | Admitting: Family Medicine

## 2012-12-30 ENCOUNTER — Other Ambulatory Visit: Payer: Self-pay | Admitting: Family Medicine

## 2012-12-31 MED ORDER — ROPINIROLE HCL 1 MG PO TABS: 1.0000 mg | ORAL_TABLET | Freq: Every day | ORAL | Status: DC

## 2012-12-31 MED ORDER — ZOLPIDEM TARTRATE 10 MG PO TABS: 10.0000 mg | ORAL_TABLET | Freq: Every evening | ORAL | Status: DC | PRN

## 2012-12-31 NOTE — Telephone Encounter (Signed)
Please advise refill and quantity. 

## 2012-12-31 NOTE — Telephone Encounter (Signed)
Patient states she is taking 2 tablets. RX sent   Please advise? Ambien RX was last done 11-20-12 quantity 30 with 0 refills.  If ok fax to [phone]

## 2012-12-31 NOTE — Telephone Encounter (Signed)
i need to know if it is working and did she go up to 2 tabs if so I will change RX to 1mg  Requip tabs and give 30 day supply with 3 rf il seen

## 2012-12-31 NOTE — Telephone Encounter (Signed)
RX sent

## 2012-12-31 NOTE — Telephone Encounter (Signed)
OK to send ambien same sig same strength, disp #30 with 2rf

## 2013-01-15 ENCOUNTER — Other Ambulatory Visit: Payer: Self-pay | Admitting: Family Medicine

## 2013-03-22 ENCOUNTER — Other Ambulatory Visit: Payer: Self-pay | Admitting: Family Medicine

## 2013-04-14 ENCOUNTER — Other Ambulatory Visit: Payer: Self-pay | Admitting: Family Medicine

## 2013-04-14 NOTE — Telephone Encounter (Signed)
Please advise refill? Last RX was done on 12-31-12 quantity 30 with 2 refills   If ok fax to [phone]

## 2013-04-14 NOTE — Telephone Encounter (Signed)
RX faxed

## 2013-05-12 ENCOUNTER — Telehealth: Payer: Self-pay | Admitting: Family Medicine

## 2013-05-12 MED ORDER — ESCITALOPRAM OXALATE 10 MG PO TABS: ORAL_TABLET | ORAL | Status: DC

## 2013-05-12 NOTE — Telephone Encounter (Signed)
Refill escitalopram

## 2013-05-19 ENCOUNTER — Ambulatory Visit (INDEPENDENT_AMBULATORY_CARE_PROVIDER_SITE_OTHER): Payer: BC Managed Care – PPO | Admitting: Family Medicine

## 2013-05-19 ENCOUNTER — Encounter: Payer: Self-pay | Admitting: Family Medicine

## 2013-05-19 VITALS — BP 119/81 | HR 73 | Temp 97.4°F | Ht 64.0 in | Wt 151.0 lb

## 2013-05-19 DIAGNOSIS — B3731 Acute candidiasis of vulva and vagina: Secondary | ICD-10-CM | POA: Insufficient documentation

## 2013-05-19 DIAGNOSIS — B373 Candidiasis of vulva and vagina: Secondary | ICD-10-CM

## 2013-05-19 MED ORDER — FLUCONAZOLE 150 MG PO TABS: ORAL_TABLET | ORAL | Status: DC

## 2013-05-19 NOTE — Progress Notes (Signed)
OFFICE NOTE  05/19/2013  CC: "vaginal itching and burning"   HPI: Patient is a 38 y.o. Caucasian female who is here for onset of itching and burning in/around vagina 3 d/a.  No discharge.  Applied hydrocortisone --no help. Just prior to sx's she had several days of watery diarrhea. No recent antibiotics.   She is allergic to monistat/OTC topical antifungals.  Has had similar problems in the past, esp around menses, had to be treated regularly with diflucan.    Pertinent PMH:  Recurrent yeast vulvar infections MEDS:  Outpatient Prescriptions Prior to Visit  Medication Sig Dispense Refill  . ALPRAZolam (XANAX) 0.25 MG tablet Take 1 tablet (0.25 mg total) by mouth 2 (two) times daily as needed for sleep or anxiety.  20 tablet  1  . escitalopram (LEXAPRO) 10 MG tablet Take 1 tablet po qd for 20 days then 2 tablets po for 10 days  40 tablet  1  . ethynodiol-ethinyl estradiol (KELNOR,ZOVIA) 1-35 MG-MCG tablet Take 1 tablet by mouth daily.      Marland Kitchen ibuprofen (ADVIL,MOTRIN) 200 MG tablet Take 400 mg by mouth once as needed. For pain       . rOPINIRole (REQUIP) 0.5 MG tablet Take 2 tablets (1 mg total) by mouth at bedtime.  60 tablet  1  . zolpidem (AMBIEN) 10 MG tablet TAKE 1 TABLET BY MOUTH AT BEDTIME AS NEEDED FOR SLEEP  30 tablet  2  . MAGNESIUM PO Take 2 capsules by mouth 2 (two) times daily.      . NON FORMULARY Citrus Bioflavenoids 500 mg      . NON FORMULARY Vari-gone (for circulation and veins) 3 capsules 3 times daily      . OVER THE COUNTER MEDICATION Nordic berries- multivitamin- 2daily      . OVER THE COUNTER MEDICATION Take 2 capsules by mouth daily. PROBIOTIC 11      . VITAMIN D, CHOLECALCIFEROL, PO Take 1 tablet by mouth daily. Nordic berries      . chlorpheniramine-HYDROcodone (TUSSIONEX PENNKINETIC ER) 10-8 MG/5ML LQCR Take 5 mLs by mouth at bedtime as needed.  140 mL  1   No facility-administered medications prior to visit.    PE: Blood pressure 119/81, pulse 73,  temperature 97.4 F (36.3 C), temperature source Temporal, height 5\' 4"  (1.626 m), weight 151 lb (68.493 kg), last menstrual period 05/12/2013, SpO2 98.00%. Pt examined with Kathyrn Lass, CMA, as chaperone. Gen: Alert, well appearing.  Patient is oriented to person, place, time, and situation. GU: erythematous labia minora>labia majora, with additional erythema of vaginal mucosa.  No discharge, no odor.    IMPRESSION AND PLAN: Yeast vaginitis Vulvovaginitis, recurrent. Allergic/intolerance to topical antifungals. Diflucan 150mg  qd x 3d, RF x 1.   An After Visit Summary was printed and given to the patient.  FOLLOW UP: prn

## 2013-05-19 NOTE — Assessment & Plan Note (Signed)
Vulvovaginitis, recurrent. Allergic/intolerance to topical antifungals. Diflucan 150mg  qd x 3d, RF x 1.

## 2013-06-10 ENCOUNTER — Telehealth: Payer: Self-pay | Admitting: Family Medicine

## 2013-06-10 MED ORDER — ROPINIROLE HCL 0.5 MG PO TABS: 1.0000 mg | ORAL_TABLET | Freq: Every day | ORAL | Status: DC

## 2013-06-10 NOTE — Telephone Encounter (Signed)
Please advise pt that I sent in an RX with 2 refills but she will need to schedule a follow up for additional refills.

## 2013-06-10 NOTE — Telephone Encounter (Signed)
Refill ropinirole 

## 2013-06-12 NOTE — Telephone Encounter (Signed)
Informed patient of medication refill and she scheduled appointment for 07/17/13 °

## 2013-06-12 NOTE — Telephone Encounter (Signed)
Left message for patient to return my call.

## 2013-07-11 ENCOUNTER — Telehealth: Payer: Self-pay | Admitting: Family Medicine

## 2013-07-11 MED ORDER — ZOLPIDEM TARTRATE 10 MG PO TABS: 10.0000 mg | ORAL_TABLET | Freq: Every evening | ORAL | Status: DC | PRN

## 2013-07-11 MED ORDER — ESCITALOPRAM OXALATE 10 MG PO TABS: ORAL_TABLET | ORAL | Status: DC

## 2013-07-11 NOTE — Telephone Encounter (Signed)
Refill- zolpidem  Refill- escitalopram  [id number] in [place]

## 2013-07-17 ENCOUNTER — Ambulatory Visit (INDEPENDENT_AMBULATORY_CARE_PROVIDER_SITE_OTHER): Payer: BC Managed Care – PPO | Admitting: Family Medicine

## 2013-07-17 ENCOUNTER — Encounter: Payer: Self-pay | Admitting: Family Medicine

## 2013-07-17 ENCOUNTER — Ambulatory Visit (HOSPITAL_BASED_OUTPATIENT_CLINIC_OR_DEPARTMENT_OTHER)
Admission: RE | Admit: 2013-07-17 | Discharge: 2013-07-17 | Disposition: A | Discharge status: Home / Self Care | Payer: BC Managed Care – PPO | Source: Ambulatory Visit | Attending: Family Medicine | Admitting: Family Medicine

## 2013-07-17 VITALS — BP 112/60 | HR 73 | Temp 98.0°F | Ht 64.0 in | Wt 158.1 lb

## 2013-07-17 DIAGNOSIS — E785 Hyperlipidemia, unspecified: Secondary | ICD-10-CM

## 2013-07-17 DIAGNOSIS — M549 Dorsalgia, unspecified: Secondary | ICD-10-CM

## 2013-07-17 DIAGNOSIS — E663 Overweight: Secondary | ICD-10-CM

## 2013-07-17 LAB — POCT URINE PREGNANCY: Preg Test, Ur: NEGATIVE

## 2013-07-17 MED ORDER — CYCLOBENZAPRINE HCL 10 MG PO TABS: 10.0000 mg | ORAL_TABLET | Freq: Every evening | ORAL | Status: DC | PRN

## 2013-07-17 NOTE — Patient Instructions (Signed)
Complex Regional Pain Syndrome Complex Regional Pain Syndrome (CRPS) is a nerve disorder that occurs at the site of an injury. It occurs especially after injuries from high-velocity impacts such as those from bullets or shrapnel. However, it may occur without apparent injury. The arms or legs are usually involved. SYMPTOMS  CRPS is a chronic condition characterized by:  Severe burning pain.  Changes in bone and skin.  Excessive sweating.  Tissue swelling.  Extreme sensitivity to touch. One visible sign of CRPS near the site of injury is warm, shiny, red skin that later becomes cool and bluish. The pain that patients report is out of proportion to the severity of the injury. The pain gets worse, rather than better, over time. Eventually the joints become stiff from disuse and the skin, muscles, and bone atrophy. The symptoms of CRPS vary in severity and duration. The cause of CRPS is unknown. The disorder is unique in that it simultaneously affects the:  Nerves.  Skin.  Muscles.  Blood vessels.  Bones. CRPS can strike at any age but is more common between the ages of 4 and 61. However, the number of CRPS cases among adolescents and young adults is increasing. CRPS is diagnosed primarily through observation of the symptoms. Some physicians use thermography to detect changes in body temperature that are common in CRPS. X-rays may also show changes in the bone.  TREATMENT  Treatments include:  Physicians use a variety of drugs to treat CRPS.  Elevation of the extremity and physical therapy are also used to treat CRPS.  Injection of a local anesthetic.  Applying brief pulses of electricity to nerve endings under the skin (transcutaneous electrical stimulation or TENS).  In some cases, interruption of the affected portion of the sympathetic nervous system (surgical or chemical sympathectomy) is necessary to relieve pain. This involves cutting the nerve or nerves. Pain is destroyed  almost instantly, but this treatment may also destroy other sensations. PROGNOSIS Good progress can be made in treating CRPS if treatment is begun early. Ideally treatment should begin within 3 months of the first symptoms. Early treatment often results in remission. If treatment is delayed, the disorder can quickly spread to the entire limb, and changes in bone and muscle may become irreversible. In 50 percent of CRPS cases, pain persists longer than 6 months and sometimes for years. Document Released: 01/20/2002 Document Revised: 04/24/2011 Document Reviewed: 12/11/2007 [facility] Patient Information 2014 [place], [place]. Raynaud's Syndrome Raynaud's Syndrome is a disorder of the blood vessels in your hands and feet. It occurs when small arteries of the arms/hands or legs/feet become sensitive to cold or emotional upset. This causes the arteries to constrict, or narrow, and reduces blood flow to the area. The color in the fingers or toes changes from white to bluish to red and this is not usually painful. There may be numbness and tingling. Sores on the skin (ulcers) can form. Symptoms are usually relieved by warming. HOME CARE INSTRUCTIONS   Avoid exposure to cold. Keep your whole body warm and dry. Dress in layers. Wear mittens or gloves when handling ice or frozen food and when outdoors. Use holders for glasses or cans containing cold drinks. If possible, stay indoors during cold weather.  Limit your use of caffeine. Switch to decaffeinated coffee, tea, and soda pop. Avoid chocolate.  Avoid smoking or being around cigarette smoke. Smoke will make symptoms worse.  Wear loose fitting socks and comfortable, roomy shoes.  Avoid vibrating tools and machinery.  If possible, avoid  stressful and emotional situations. Exercise, meditation and yoga may help you cope with stress. Biofeedback may be useful.  Ask your caregiver about medicine (calcium channel blockers) that may control Raynaud's  phenomena. SEEK MEDICAL CARE IF:   Your discomfort becomes worse, despite conservative treatment.  You develop sores on your fingers and toes that do not heal. Document Released: 01/28/2000 Document Revised: 04/24/2011 Document Reviewed: 02/04/2008 [facility] Patient Information 2014 [place], [place].

## 2013-07-17 NOTE — Progress Notes (Signed)
Pre visit review using our clinic review tool, if applicable. No additional management support is needed unless otherwise documented below in the visit note. 

## 2013-07-21 ENCOUNTER — Encounter: Payer: Self-pay | Admitting: Family Medicine

## 2013-07-21 DIAGNOSIS — M549 Dorsalgia, unspecified: Secondary | ICD-10-CM | POA: Insufficient documentation

## 2013-07-21 NOTE — Progress Notes (Signed)
Patient ID: Madeline Murphy, female   DOB: [birth year], 38 y.o.   MRN: [id number] Hailei Besser [id number] Apr 29, 1975 07/21/2013      Progress Note-Follow Up  Subjective  Chief Complaint  Chief Complaint  Patient presents with  . Medication Refill    HPI  Patient is a 38 year old female in today for routine medical care. Patient is here today to discuss back pain. She's having pain in her left posterior shoulder but also in her low back and her left leg. Has some numbness and tingling especially in cold weather in her toes in bilateral legs. Worse with exercise. She has tight muscles in her hips as well. Has ongoing trouble with restless leg syndrome. No recent illness. Well controlled, no changes to meds. Encouraged heart healthy diet such as the DASH diet and exercise as tolerated.   Past Medical History  Diagnosis Date  . Bronchitis 01/30/2011  . Vaginitis 06/02/2011  . Sinusitis 02/19/2012  . Strep pharyngitis 02/19/2012  . Overweight 06/02/2012  . Other and unspecified hyperlipidemia 09/12/2012  . Insomnia 09/12/2012  . RLS (restless legs syndrome) 09/12/2012  . Depression with anxiety 09/12/2012    With some PMS components    Past Surgical History  Procedure Laterality Date  . Chronc compartmenal syndrome      S/P ortho surgery x3    Family History  Problem Relation Age of Onset  . Coronary artery disease    . Hypertension    . Hyperlipidemia Mother   . Heart disease Mother     palpitation  . Heart disease Father     s/p bypass and stent  . Hyperlipidemia Father   . Hypertension Father   . Otitis media Daughter     recurrent strep  . Cancer Maternal Aunt     breast  . Cancer Maternal Grandfather     possibly stomach  . Aneurysm Paternal Grandmother     likely brain  . Cancer Paternal Grandmother     breast  . Otitis media Daughter     History   Social History  . Marital Status: Married    Spouse Name: N/A    Number of Children: N/A  . Years of Education: N/A    Occupational History  . Not on file.   Social History Main Topics  . Smoking status: Never Smoker   . Smokeless tobacco: Never Used  . Alcohol Use: Yes  . Drug Use: No  . Sexual Activity: Yes    Birth Control/ Protection: Pill   Other Topics Concern  . Not on file   Social History Narrative  . No narrative on file    Current Outpatient Prescriptions on File Prior to Visit  Medication Sig Dispense Refill  . ALPRAZolam (XANAX) 0.25 MG tablet Take 1 tablet (0.25 mg total) by mouth 2 (two) times daily as needed for sleep or anxiety.  20 tablet  1  . escitalopram (LEXAPRO) 10 MG tablet Take 1 tablet po qd for 20 days then 2 tablets po for 10 days  40 tablet  0  . ethynodiol-ethinyl estradiol (KELNOR,ZOVIA) 1-35 MG-MCG tablet Take 1 tablet by mouth daily.      Marland Kitchen ibuprofen (ADVIL,MOTRIN) 200 MG tablet Take 400 mg by mouth once as needed. For pain       . OVER THE COUNTER MEDICATION Take 2 capsules by mouth daily. PROBIOTIC 11      . rOPINIRole (REQUIP) 0.5 MG tablet Take 2 tablets (1 mg total) by mouth at bedtime.  60 tablet  1  . zolpidem (AMBIEN) 10 MG tablet Take 1 tablet (10 mg total) by mouth at bedtime as needed for sleep.  30 tablet  0  . [DISCONTINUED] albuterol (VENTOLIN HFA) 108 (90 BASE) MCG/ACT inhaler Inhale 2 puffs into the lungs every 6 (six) hours as needed for wheezing.  1 Inhaler  0  . [DISCONTINUED] Almotriptan Malate (AXERT PO) Take by mouth as needed.        . [DISCONTINUED] fluticasone (FLONASE) 50 MCG/ACT nasal spray Place 1 spray into the nose 2 (two) times daily.  16 g  1  . [DISCONTINUED] Mometasone Furo-Formoterol Fum 200-5 MCG/ACT AERO 1-2  inhalations every 12 hours; gargle and spit after use  8.8 g  0  . [DISCONTINUED] topiramate (TOPAMAX) 50 MG tablet Take by mouth 2 (two) times daily. Take 3 tab  At bedtime for migraines        No current facility-administered medications on file prior to visit.    Allergies  Allergen Reactions  . Codeine Nausea  Only    Syncope     Review of Systems  Review of Systems  Constitutional: Negative for fever and malaise/fatigue.  HENT: Negative for congestion.   Eyes: Negative for discharge.  Respiratory: Negative for shortness of breath.   Cardiovascular: Negative for chest pain, palpitations and leg swelling.  Gastrointestinal: Negative for nausea, abdominal pain and diarrhea.  Genitourinary: Negative for dysuria.  Musculoskeletal: Positive for back pain and joint pain. Negative for falls.  Skin: Negative for rash.  Neurological: Negative for loss of consciousness and headaches.  Endo/Heme/Allergies: Negative for polydipsia.  Psychiatric/Behavioral: Negative for depression and suicidal ideas. The patient is not nervous/anxious and does not have insomnia.     Objective  BP 112/60  Pulse 73  Temp(Src) 98 F (36.7 C) (Oral)  Ht 5\' 4"  (1.626 m)  Wt 158 lb 1.3 oz (71.705 kg)  BMI 27.12 kg/m2  SpO2 98%  LMP 07/08/2013  Physical Exam  Physical Exam  Constitutional: She is oriented to person, place, and time and well-developed, well-nourished, and in no distress. No distress.  HENT:  Head: Normocephalic and atraumatic.  Right Ear: External ear normal.  Left Ear: External ear normal.  Nose: Nose normal.  Mouth/Throat: Oropharynx is clear and moist. No oropharyngeal exudate.  Eyes: Conjunctivae are normal. Pupils are equal, round, and reactive to light. Right eye exhibits no discharge. Left eye exhibits no discharge. No scleral icterus.  Neck: Normal range of motion. Neck supple. No thyromegaly present.  Cardiovascular: Normal rate, regular rhythm, normal heart sounds and intact distal pulses.   No murmur heard. Pulmonary/Chest: Effort normal and breath sounds normal. No respiratory distress. She has no wheezes. She has no rales.  Abdominal: Soft. Bowel sounds are normal. She exhibits no distension and no mass. There is no tenderness.  Musculoskeletal: Normal range of motion. She  exhibits no edema and no tenderness.  Lymphadenopathy:    She has no cervical adenopathy.  Neurological: She is alert and oriented to person, place, and time. She has normal reflexes. No cranial nerve deficit. Coordination normal.  Skin: Skin is warm and dry. No rash noted. She is not diaphoretic.  Psychiatric: Mood, memory and affect normal.    Lab Results  Component Value Date   TSH 0.64 09/04/2012   Lab Results  Component Value Date   WBC 6.4 09/04/2012   HGB 12.8 09/04/2012   HCT 38.1 09/04/2012   MCV 82.7 09/04/2012   PLT 235.0 09/04/2012  Lab Results  Component Value Date   CREATININE 0.8 09/04/2012   BUN 9 09/04/2012   NA 137 09/04/2012   K 4.1 09/04/2012   CL 103 09/04/2012   CO2 27 09/04/2012   Lab Results  Component Value Date   ALT 12 09/04/2012   AST 16 09/04/2012   ALKPHOS 56 09/04/2012   BILITOT 0.3 09/04/2012   Lab Results  Component Value Date   CHOL 174 09/04/2012   Lab Results  Component Value Date   HDL 50.10 09/04/2012   Lab Results  Component Value Date   LDLCALC 89 09/04/2012   Lab Results  Component Value Date   TRIG 174.0* 09/04/2012   Lab Results  Component Value Date   CHOLHDL 3 09/04/2012     Assessment & Plan  Overweight Encouraged DASH diet, decrease po intake and increase exercise as tolerated. Needs 7-8 hours of sleep nightly. Avoid trans fats, eat small, frequent meals every 4-5 hours with lean proteins, complex carbs and healthy fats. Minimize simple carbs, GMO foods.  Other and unspecified hyperlipidemia Encouraged heart healthy diet, increase exercise, avoid trans fats, consider a krill oil cap daily  Back pain Encouraged moist heat and topical treatments. Consider chiropractic care and referral to ortho or PT as needed

## 2013-07-21 NOTE — Assessment & Plan Note (Signed)
Encouraged heart healthy diet, increase exercise, avoid trans fats, consider a krill oil cap daily 

## 2013-07-21 NOTE — Assessment & Plan Note (Signed)
Encouraged moist heat and topical treatments. Consider chiropractic care and referral to ortho or PT as needed

## 2013-07-21 NOTE — Assessment & Plan Note (Signed)
Encouraged DASH diet, decrease po intake and increase exercise as tolerated. Needs 7-8 hours of sleep nightly. Avoid trans fats, eat small, frequent meals every 4-5 hours with lean proteins, complex carbs and healthy fats. Minimize simple carbs, GMO foods. 

## 2013-08-04 ENCOUNTER — Other Ambulatory Visit: Payer: Self-pay | Admitting: Family Medicine

## 2013-08-05 ENCOUNTER — Other Ambulatory Visit: Payer: Self-pay | Admitting: Family Medicine

## 2013-08-05 MED ORDER — ROPINIROLE HCL 0.5 MG PO TABS: 1.0000 mg | ORAL_TABLET | Freq: Every day | ORAL | Status: DC

## 2013-08-05 NOTE — Telephone Encounter (Signed)
Refill- ropinirole  [facility]

## 2013-08-05 NOTE — Telephone Encounter (Signed)
Refill request for requip Last filled by MD on - 06/10/2013 #60 x1 Last Appt:07/17/2013 Next Appt:none Please advise refill?

## 2013-08-06 ENCOUNTER — Other Ambulatory Visit: Payer: Self-pay | Admitting: Family Medicine

## 2013-08-06 NOTE — Telephone Encounter (Signed)
Please advise refills 

## 2013-08-06 NOTE — Telephone Encounter (Signed)
Rx request faxed to pharmacy/[facility]  

## 2013-08-24 ENCOUNTER — Other Ambulatory Visit: Payer: Self-pay | Admitting: Family Medicine

## 2013-08-25 NOTE — Telephone Encounter (Signed)
Please advise refill? Last RX was done on 08-06-13 quantity 20 with 0 refills. Last OV 07-17-13

## 2013-09-15 ENCOUNTER — Other Ambulatory Visit: Payer: Self-pay | Admitting: Family Medicine

## 2013-09-15 NOTE — Telephone Encounter (Signed)
Please advise refill?  Last RX was done on 08-06-13, quantity 30 with 0 refills

## 2013-09-16 NOTE — Telephone Encounter (Signed)
RX faxed

## 2013-10-04 ENCOUNTER — Other Ambulatory Visit: Payer: Self-pay | Admitting: Family Medicine

## 2013-10-07 ENCOUNTER — Other Ambulatory Visit: Payer: Self-pay | Admitting: Obstetrics and Gynecology

## 2013-10-08 LAB — CYTOLOGY - PAP

## 2013-10-15 ENCOUNTER — Other Ambulatory Visit: Payer: Self-pay | Admitting: Family Medicine

## 2013-10-15 NOTE — Telephone Encounter (Signed)
Last RX for Ambien was done on 09-15-13  rx printed for md to sign when she returns tomorrow

## 2013-10-29 ENCOUNTER — Other Ambulatory Visit: Payer: Self-pay | Admitting: Family Medicine

## 2013-11-11 ENCOUNTER — Other Ambulatory Visit: Payer: Self-pay | Admitting: Family Medicine

## 2013-11-11 NOTE — Telephone Encounter (Signed)
This is due on Friday we will print on Thursday

## 2013-11-12 ENCOUNTER — Telehealth: Payer: Self-pay | Admitting: *Deleted

## 2013-11-12 NOTE — Telephone Encounter (Signed)
Belay my last. Madeline Murphy noted that she will print out Thursday and give Friday.

## 2013-11-12 NOTE — Telephone Encounter (Signed)
rx refill - ambien 10 mg  Last OV- 07/17/13  Last refilled - 10/15/13 #30 / 0 rf

## 2013-11-13 NOTE — Telephone Encounter (Signed)
Faxed to pharmacy

## 2013-11-18 ENCOUNTER — Other Ambulatory Visit: Payer: Self-pay

## 2013-11-18 DIAGNOSIS — F329 Major depressive disorder, single episode, unspecified: Secondary | ICD-10-CM

## 2013-11-18 DIAGNOSIS — F419 Anxiety disorder, unspecified: Principal | ICD-10-CM

## 2013-11-18 MED ORDER — ALPRAZOLAM 0.25 MG PO TABS: 0.2500 mg | ORAL_TABLET | Freq: Two times a day (BID) | ORAL | Status: DC | PRN

## 2013-12-02 ENCOUNTER — Other Ambulatory Visit: Payer: Self-pay | Admitting: Family Medicine

## 2013-12-03 ENCOUNTER — Other Ambulatory Visit: Payer: Self-pay | Admitting: Dermatology

## 2013-12-16 ENCOUNTER — Other Ambulatory Visit: Payer: Self-pay | Admitting: Family Medicine

## 2013-12-16 NOTE — Telephone Encounter (Signed)
Informed patient of med refill and she states that she will have to callback to schedule appointment for December.

## 2013-12-16 NOTE — Telephone Encounter (Signed)
RX printed for md to sign and fax  Please inform pt though she will need an appt around 01-16-14 due to having to be seen every 6 months when taking controlled substances    Last RX was done on 11-13-13 quantity 30 with 0 refills

## 2014-01-22 ENCOUNTER — Ambulatory Visit (INDEPENDENT_AMBULATORY_CARE_PROVIDER_SITE_OTHER): Payer: BC Managed Care – PPO | Admitting: Family Medicine

## 2014-01-22 ENCOUNTER — Other Ambulatory Visit: Payer: Self-pay | Admitting: Family Medicine

## 2014-01-22 ENCOUNTER — Encounter: Payer: Self-pay | Admitting: Family Medicine

## 2014-01-22 VITALS — BP 110/82 | HR 57 | Temp 98.5°F | Ht 64.0 in | Wt 164.8 lb

## 2014-01-22 DIAGNOSIS — E663 Overweight: Secondary | ICD-10-CM

## 2014-01-22 DIAGNOSIS — G2581 Restless legs syndrome: Secondary | ICD-10-CM

## 2014-01-22 DIAGNOSIS — Z0000 Encounter for general adult medical examination without abnormal findings: Secondary | ICD-10-CM

## 2014-01-22 DIAGNOSIS — G43009 Migraine without aura, not intractable, without status migrainosus: Secondary | ICD-10-CM

## 2014-01-22 DIAGNOSIS — M545 Low back pain: Secondary | ICD-10-CM

## 2014-01-22 DIAGNOSIS — G4700 Insomnia, unspecified: Secondary | ICD-10-CM

## 2014-01-22 DIAGNOSIS — R51 Headache: Secondary | ICD-10-CM

## 2014-01-22 DIAGNOSIS — R519 Headache, unspecified: Secondary | ICD-10-CM

## 2014-01-22 DIAGNOSIS — E785 Hyperlipidemia, unspecified: Secondary | ICD-10-CM

## 2014-01-22 DIAGNOSIS — E782 Mixed hyperlipidemia: Secondary | ICD-10-CM

## 2014-01-22 LAB — HEPATIC FUNCTION PANEL
ALBUMIN: 3.9 g/dL (ref 3.5–5.2)
ALK PHOS: 67 U/L (ref 39–117)
ALT: 18 U/L (ref 0–35)
AST: 18 U/L (ref 0–37)
BILIRUBIN DIRECT: 0 mg/dL (ref 0.0–0.3)
BILIRUBIN TOTAL: 0.3 mg/dL (ref 0.2–1.2)
Total Protein: 7.5 g/dL (ref 6.0–8.3)

## 2014-01-22 LAB — TSH: TSH: 1.44 u[IU]/mL (ref 0.35–4.50)

## 2014-01-22 LAB — RENAL FUNCTION PANEL
ALBUMIN: 3.9 g/dL (ref 3.5–5.2)
BUN: 11 mg/dL (ref 6–23)
CO2: 29 mEq/L (ref 19–32)
CREATININE: 0.8 mg/dL (ref 0.4–1.2)
Calcium: 9.3 mg/dL (ref 8.4–10.5)
Chloride: 101 mEq/L (ref 96–112)
GFR: 83.24 mL/min (ref >60.00)
GLUCOSE: 90 mg/dL (ref 70–99)
Phosphorus: 3.5 mg/dL (ref 2.3–4.6)
Potassium: 4.4 mEq/L (ref 3.5–5.1)
Sodium: 135 mEq/L (ref 135–145)

## 2014-01-22 LAB — CBC
HCT: 42.3 % (ref 36.0–46.0)
HEMOGLOBIN: 13.5 g/dL (ref 12.0–15.0)
MCHC: 32 g/dL (ref 30.0–36.0)
MCV: 82.3 fl (ref 78.0–100.0)
PLATELETS: 295 10*3/uL (ref 150.0–400.0)
RBC: 5.14 Mil/uL — ABNORMAL HIGH (ref 3.87–5.11)
RDW: 12.8 % (ref 11.5–15.5)
WBC: 8.4 10*3/uL (ref 4.0–10.5)

## 2014-01-22 LAB — LIPID PANEL
CHOL/HDL RATIO: 4
Cholesterol: 215 mg/dL — ABNORMAL HIGH (ref 0–200)
HDL: 57.9 mg/dL (ref >39.00)
NONHDL: 157.1
Triglycerides: 248 mg/dL — ABNORMAL HIGH (ref 0.0–149.0)
VLDL: 49.6 mg/dL — ABNORMAL HIGH (ref 0.0–40.0)

## 2014-01-22 LAB — LDL CHOLESTEROL, DIRECT: Direct LDL: 137.3 mg/dL

## 2014-01-22 MED ORDER — ZOLPIDEM TARTRATE 10 MG PO TABS: 10.0000 mg | ORAL_TABLET | Freq: Every evening | ORAL | Status: DC | PRN

## 2014-01-22 MED ORDER — ROPINIROLE HCL 1 MG PO TABS: 1.0000 mg | ORAL_TABLET | Freq: Three times a day (TID) | ORAL | Status: DC

## 2014-01-22 NOTE — Progress Notes (Signed)
Pre visit review using our clinic review tool, if applicable. No additional management support is needed unless otherwise documented below in the visit note. 

## 2014-01-22 NOTE — Patient Instructions (Signed)
Need 64 oz of clear fluids daily, exercise, small, frequent meals with lean proteins and healthy fats. General Headache Without Cause A headache is pain or discomfort felt around the head or neck area. The specific cause of a headache may not be found. There are many causes and types of headaches. A few common ones are:  Tension headaches.  Migraine headaches.  Cluster headaches.  Chronic daily headaches. HOME CARE INSTRUCTIONS   Keep all follow-up appointments with your caregiver or any specialist referral.  Only take over-the-counter or prescription medicines for pain or discomfort as directed by your caregiver.  Lie down in a dark, quiet room when you have a headache.  Keep a headache journal to find out what may trigger your migraine headaches. For example, write down:  What you eat and drink.  How much sleep you get.  Any change to your diet or medicines.  Try massage or other relaxation techniques.  Put ice packs or heat on the head and neck. Use these 3 to 4 times per day for 15 to 20 minutes each time, or as needed.  Limit stress.  Sit up straight, and do not tense your muscles.  Quit smoking if you smoke.  Limit alcohol use.  Decrease the amount of caffeine you drink, or stop drinking caffeine.  Eat and sleep on a regular schedule.  Get 7 to 9 hours of sleep, or as recommended by your caregiver.  Keep lights dim if bright lights bother you and make your headaches worse. SEEK MEDICAL CARE IF:   You have problems with the medicines you were prescribed.  Your medicines are not working.  You have a change from the usual headache.  You have nausea or vomiting. SEEK IMMEDIATE MEDICAL CARE IF:   Your headache becomes severe.  You have a fever.  You have a stiff neck.  You have loss of vision.  You have muscular weakness or loss of muscle control.  You start losing your balance or have trouble walking.  You feel faint or pass out.  You have  severe symptoms that are different from your first symptoms. MAKE SURE YOU:   Understand these instructions.  Will watch your condition.  Will get help right away if you are not doing well or get worse. Document Released: 01/30/2005 Document Revised: 04/24/2011 Document Reviewed: 02/15/2011 [facility] Patient Information 2015 [place], [place]. This information is not intended to replace advice given to you by your health care provider. Make sure you discuss any questions you have with your health care provider.

## 2014-01-27 ENCOUNTER — Other Ambulatory Visit: Payer: Self-pay | Admitting: Family Medicine

## 2014-02-01 ENCOUNTER — Encounter: Payer: Self-pay | Admitting: Family Medicine

## 2014-02-01 NOTE — Assessment & Plan Note (Signed)
Encouraged heart healthy diet, increase exercise, avoid trans fats, consider a krill oil cap daily 

## 2014-02-01 NOTE — Progress Notes (Signed)
Madeline Murphy  [id number] 1975-08-19 02/01/2014      Progress Note-Follow Up  Subjective  Chief Complaint  Chief Complaint  Patient presents with  . Follow-up    med check    HPI  Patient is a 38 y.o. female in today for routine medical care. Continues to follow with chiropractic but unfortunately still having intermittent trouble with back pain. Wakes up frequently with headaches and a dry mouth. He acknowledges persistent trouble with sleeping and dry mouth. No other recent illness. Denies sudden ideation but does acknowledge anhedonia. Denies CP/palp/SOB/HA/congestion/fevers/GI or GU c/o. Taking meds as prescribed  Past Medical History  Diagnosis Date  . Bronchitis 01/30/2011  . Vaginitis 06/02/2011  . Sinusitis 02/19/2012  . Strep pharyngitis 02/19/2012  . Overweight(278.02) 06/02/2012  . Other and unspecified hyperlipidemia 09/12/2012  . Insomnia 09/12/2012  . RLS (restless legs syndrome) 09/12/2012  . Depression with anxiety 09/12/2012    With some PMS components    Past Surgical History  Procedure Laterality Date  . Chronc compartmenal syndrome      S/P ortho surgery x3    Family History  Problem Relation Age of Onset  . Coronary artery disease    . Hypertension    . Hyperlipidemia Mother   . Heart disease Mother     palpitation  . Heart disease Father     s/p bypass and stent  . Hyperlipidemia Father   . Hypertension Father   . Otitis media Daughter     recurrent strep  . Cancer Maternal Aunt     breast  . Cancer Maternal Grandfather     possibly stomach  . Aneurysm Paternal Grandmother     likely brain  . Cancer Paternal Grandmother     breast  . Otitis media Daughter     History   Social History  . Marital Status: Married    Spouse Name: N/A    Number of Children: N/A  . Years of Education: N/A   Occupational History  . Not on file.   Social History Main Topics  . Smoking status: Never Smoker   . Smokeless tobacco: Never Used  . Alcohol  Use: Yes  . Drug Use: No  . Sexual Activity: Yes    Birth Control/ Protection: Pill   Other Topics Concern  . Not on file   Social History Narrative    Current Outpatient Prescriptions on File Prior to Visit  Medication Sig Dispense Refill  . ALPRAZolam (XANAX) 0.25 MG tablet Take 1 tablet (0.25 mg total) by mouth 2 (two) times daily as needed for sleep or anxiety. 20 tablet 0  . cyclobenzaprine (FLEXERIL) 10 MG tablet TAKE 1 TABLET (10 MG TOTAL) BY MOUTH AT BEDTIME AS NEEDED FOR MUSCLE SPASMS. 20 tablet 0  . ethynodiol-ethinyl estradiol (KELNOR,ZOVIA) 1-35 MG-MCG tablet Take 1 tablet by mouth daily.    Marland Kitchen ibuprofen (ADVIL,MOTRIN) 200 MG tablet Take 400 mg by mouth once as needed. For pain     . OVER THE COUNTER MEDICATION Take 2 capsules by mouth daily. PROBIOTIC 11    . [DISCONTINUED] albuterol (VENTOLIN HFA) 108 (90 BASE) MCG/ACT inhaler Inhale 2 puffs into the lungs every 6 (six) hours as needed for wheezing. 1 Inhaler 0  . [DISCONTINUED] Almotriptan Malate (AXERT PO) Take by mouth as needed.      . [DISCONTINUED] fluticasone (FLONASE) 50 MCG/ACT nasal spray Place 1 spray into the nose 2 (two) times daily. 16 g 1  . [DISCONTINUED] Mometasone Furo-Formoterol Fum 200-5 MCG/ACT  AERO 1-2  inhalations every 12 hours; gargle and spit after use 8.8 g 0  . [DISCONTINUED] topiramate (TOPAMAX) 50 MG tablet Take by mouth 2 (two) times daily. Take 3 tab  At bedtime for migraines      No current facility-administered medications on file prior to visit.    Allergies  Allergen Reactions  . Codeine Nausea Only    Syncope     Review of Systems  Review of Systems  Constitutional: Negative for fever and malaise/fatigue.  HENT: Negative for congestion.   Eyes: Negative for discharge.  Respiratory: Negative for shortness of breath.   Cardiovascular: Negative for chest pain, palpitations and leg swelling.  Gastrointestinal: Negative for nausea, abdominal pain and diarrhea.  Genitourinary:  Negative for dysuria.  Musculoskeletal: Positive for back pain and neck pain. Negative for falls.  Skin: Negative for rash.  Neurological: Positive for headaches. Negative for loss of consciousness.  Endo/Heme/Allergies: Negative for polydipsia.  Psychiatric/Behavioral: Positive for depression. Negative for suicidal ideas. The patient is nervous/anxious and has insomnia.     Objective  BP 110/82 mmHg  Pulse 57  Temp(Src) 98.5 F (36.9 C) (Oral)  Ht 5\' 4"  (1.626 m)  Wt 164 lb 12.8 oz (74.753 kg)  BMI 28.27 kg/m2  SpO2 100%  LMP 01/20/2014  Physical Exam  Physical Exam  Constitutional: She is oriented to person, place, and time and well-developed, well-nourished, and in no distress. No distress.  HENT:  Head: Normocephalic and atraumatic.  Eyes: Conjunctivae are normal.  Neck: Neck supple. No thyromegaly present.  Cardiovascular: Normal rate, regular rhythm and normal heart sounds.   No murmur heard. Pulmonary/Chest: Effort normal and breath sounds normal. She has no wheezes.  Abdominal: She exhibits no distension and no mass.  Musculoskeletal: She exhibits no edema.  Lymphadenopathy:    She has no cervical adenopathy.  Neurological: She is alert and oriented to person, place, and time.  Skin: Skin is warm and dry. No rash noted. She is not diaphoretic.  Psychiatric: Memory, affect and judgment normal.    Lab Results  Component Value Date   TSH 1.44 01/22/2014   Lab Results  Component Value Date   WBC 8.4 01/22/2014   HGB 13.5 01/22/2014   HCT 42.3 01/22/2014   MCV 82.3 01/22/2014   PLT 295.0 01/22/2014   Lab Results  Component Value Date   CREATININE 0.8 01/22/2014   BUN 11 01/22/2014   NA 135 01/22/2014   K 4.4 01/22/2014   CL 101 01/22/2014   CO2 29 01/22/2014   Lab Results  Component Value Date   ALT 18 01/22/2014   AST 18 01/22/2014   ALKPHOS 67 01/22/2014   BILITOT 0.3 01/22/2014   Lab Results  Component Value Date   CHOL 215* 01/22/2014    Lab Results  Component Value Date   HDL 57.90 01/22/2014   Lab Results  Component Value Date   LDLCALC 89 09/04/2012   Lab Results  Component Value Date   TRIG 248.0* 01/22/2014   Lab Results  Component Value Date   CHOLHDL 4 01/22/2014     Assessment & Plan  Overweight Encouraged DASH diet, decrease po intake and increase exercise as tolerated. Needs 7-8 hours of sleep nightly. Avoid trans fats, eat small, frequent meals every 4-5 hours with lean proteins, complex carbs and healthy fats. Minimize simple carbs, GMO foods.  Back pain Following with chiropractic. Encouraged moist heat and gentle stretching as tolerated. May try NSAIDs and prescription meds as directed and  report if symptoms worsen or seek immediate care  Insomnia Encouraged good sleep hygiene such as dark, quiet room. No blue/green glowing lights such as computer screens in bedroom. No alcohol or stimulants in evening. Cut down on caffeine as able. Regular exercise is helpful but not just prior to bed time.   Migraine without aura Encouraged increased hydration, 64 ounces of clear fluids daily. Minimize alcohol and caffeine. Eat small frequent meals with lean proteins and complex carbs. Avoid high and low blood sugars. Get adequate sleep, 7-8 hours a night. Needs exercise daily preferably in the morning.  Mixed hyperlipidemia Encouraged heart healthy diet, increase exercise, avoid trans fats, consider a krill oil cap daily

## 2014-02-01 NOTE — Assessment & Plan Note (Signed)
Encouraged increased hydration, 64 ounces of clear fluids daily. Minimize alcohol and caffeine. Eat small frequent meals with lean proteins and complex carbs. Avoid high and low blood sugars. Get adequate sleep, 7-8 hours a night. Needs exercise daily preferably in the morning.  

## 2014-02-01 NOTE — Assessment & Plan Note (Signed)
Encouraged DASH diet, decrease po intake and increase exercise as tolerated. Needs 7-8 hours of sleep nightly. Avoid trans fats, eat small, frequent meals every 4-5 hours with lean proteins, complex carbs and healthy fats. Minimize simple carbs, GMO foods. 

## 2014-02-01 NOTE — Assessment & Plan Note (Signed)
Following with chiropractic. Encouraged moist heat and gentle stretching as tolerated. May try NSAIDs and prescription meds as directed and report if symptoms worsen or seek immediate care

## 2014-02-01 NOTE — Assessment & Plan Note (Signed)
Encouraged good sleep hygiene such as dark, quiet room. No blue/green glowing lights such as computer screens in bedroom. No alcohol or stimulants in evening. Cut down on caffeine as able. Regular exercise is helpful but not just prior to bed time.  

## 2014-02-09 ENCOUNTER — Encounter: Payer: Self-pay | Admitting: Nurse Practitioner

## 2014-02-09 ENCOUNTER — Ambulatory Visit (INDEPENDENT_AMBULATORY_CARE_PROVIDER_SITE_OTHER): Payer: BC Managed Care – PPO | Admitting: Nurse Practitioner

## 2014-02-09 VITALS — BP 105/71 | HR 71 | Temp 97.3°F | Ht 64.0 in | Wt 165.0 lb

## 2014-02-09 DIAGNOSIS — M2550 Pain in unspecified joint: Secondary | ICD-10-CM

## 2014-02-09 DIAGNOSIS — J029 Acute pharyngitis, unspecified: Secondary | ICD-10-CM

## 2014-02-09 DIAGNOSIS — R059 Cough, unspecified: Secondary | ICD-10-CM

## 2014-02-09 DIAGNOSIS — R05 Cough: Secondary | ICD-10-CM

## 2014-02-09 LAB — COMPREHENSIVE METABOLIC PANEL
ALK PHOS: 65 U/L (ref 39–117)
ALT: 23 U/L (ref 0–35)
AST: 21 U/L (ref 0–37)
Albumin: 3.6 g/dL (ref 3.5–5.2)
BILIRUBIN TOTAL: 0.3 mg/dL (ref 0.2–1.2)
BUN: 9 mg/dL (ref 6–23)
CO2: 29 mEq/L (ref 19–32)
CREATININE: 0.8 mg/dL (ref 0.4–1.2)
Calcium: 8.9 mg/dL (ref 8.4–10.5)
Chloride: 105 mEq/L (ref 96–112)
GFR: 89.49 mL/min (ref >60.00)
Glucose, Bld: 82 mg/dL (ref 70–99)
POTASSIUM: 4.1 meq/L (ref 3.5–5.1)
Sodium: 138 mEq/L (ref 135–145)
Total Protein: 7.1 g/dL (ref 6.0–8.3)

## 2014-02-09 LAB — CBC WITH DIFFERENTIAL/PLATELET
BASOS PCT: 0.7 % (ref 0.0–3.0)
Basophils Absolute: 0.1 10*3/uL (ref 0.0–0.1)
EOS ABS: 0.1 10*3/uL (ref 0.0–0.7)
Eosinophils Relative: 1.9 % (ref 0.0–5.0)
HCT: 36.2 % (ref 36.0–46.0)
HEMOGLOBIN: 11.8 g/dL — AB (ref 12.0–15.0)
Lymphocytes Relative: 24.1 % (ref 12.0–46.0)
Lymphs Abs: 1.7 10*3/uL (ref 0.7–4.0)
MCHC: 32.6 g/dL (ref 30.0–36.0)
MCV: 80.7 fl (ref 78.0–100.0)
Monocytes Absolute: 0.4 10*3/uL (ref 0.1–1.0)
Monocytes Relative: 6.2 % (ref 3.0–12.0)
NEUTROS ABS: 4.8 10*3/uL (ref 1.4–7.7)
Neutrophils Relative %: 67.1 % (ref 43.0–77.0)
Platelets: 347 10*3/uL (ref 150.0–400.0)
RBC: 4.48 Mil/uL (ref 3.87–5.11)
RDW: 12.6 % (ref 11.5–15.5)
WBC: 7.2 10*3/uL (ref 4.0–10.5)

## 2014-02-09 LAB — SEDIMENTATION RATE: Sed Rate: 36 mm/hr — ABNORMAL HIGH (ref 0–22)

## 2014-02-09 LAB — RHEUMATOID FACTOR: Rhuematoid fact SerPl-aCnc: <10 IU/mL (ref <=14)

## 2014-02-09 MED ORDER — BENZONATATE 200 MG PO CAPS: 200.0000 mg | ORAL_CAPSULE | Freq: Three times a day (TID) | ORAL | Status: DC | PRN

## 2014-02-09 MED ORDER — PREDNISONE 5 MG PO TABS: ORAL_TABLET | ORAL | Status: DC

## 2014-02-09 MED ORDER — METHYLPREDNISOLONE ACETATE 40 MG/ML IJ SUSP
40.0000 mg | Freq: Once | INTRAMUSCULAR | Status: AC
  Administered 2014-02-09: 40 mg via INTRAMUSCULAR

## 2014-02-09 NOTE — Progress Notes (Signed)
Pre visit review using our clinic review tool, if applicable. No additional management support is needed unless otherwise documented below in the visit note. 

## 2014-02-09 NOTE — Progress Notes (Signed)
Subjective:    Madeline Murphy is an 38 y.o. female who presents with arthralgias that began 5 days ago. Pain is located in R shoulder, knee, ankle, Bilat MCP & MTP, bilat wrists. The pain is described as moderate, aching.  Associated symptoms include: edema, small red bumpy itchy rash proceeded joint pain. Rash lasted 1 day. The patient has tried OTC pain medications (800 mg ibuprophen) for pain, with minimal relief. Related to injury: no. She had 1 previous self-limiting episode of joint pain in past.   She had URI 2 weeks ago w/nasal congestion, sore throat & cough. Denies fever. Still has lingering cough. TSH recently checked-nml.  The following portions of the patient's history were reviewed and updated as appropriate: allergies, current medications, past medical history, past social history, past surgical history and problem list.  Review of Systems Constitutional: negative for fevers and night sweats Ears, nose, mouth, throat, and face: negative for earaches Respiratory: negative for sputum and wheezing Cardiovascular: negative for irregular heart beat Gastrointestinal: negative for abdominal pain, change in bowel habits, constipation, diarrhea and nausea Integument/breast: negative for dryness Musculoskeletal:positive for stiff joints and mild swelling of medial knee few days ago, negative for warmth Neurological: negative for headaches    Objective:    BP 105/71 mmHg  Pulse 71  Temp(Src) 97.3 F (36.3 C) (Temporal)  Ht 5\' 4"  (1.626 m)  Wt 165 lb (74.844 kg)  BMI 28.31 kg/m2  SpO2 100%  LMP 01/20/2014 General appearance: alert, cooperative, appears stated age and no distress Head: Normocephalic, without obvious abnormality, atraumatic Eyes: negative findings: lids and lashes normal and conjunctivae and sclerae normal Ears: normal TM's and external ear canals both ears Throat: lips, mucosa, and tongue normal; teeth and gums normal Neck: no adenopathy, supple, symmetrical, trachea  midline and thyroid not enlarged, symmetric, no tenderness/mass/nodules Lungs: clear to auscultation bilaterally Heart: regular rate and rhythm, S1, S2 normal, no murmur, click, rub or gallop Extremities: tender MCP & MTP if squeeze across joints. No warmth of knees, wrists, hands, or ankles. No color change in any joint, no obvious swelling of any joint. Pulses: 2+ and symmetric Skin: Skin color, texture, turgor normal. No rashes or lesions    Assessment:Plan  1. Polyarthralgia, new DD: RA, SLE, post-viral sequela - Antinuclear Antibodies, IFA - CBC with Differential - Comprehensive metabolic panel - POCT rapid strep A - Sedimentation rate - Hepatitis C antibody - Hepatitis B surface antigen - Cyclic citrul peptide antibody, IgG; Future - Rheumatoid factor - methylPREDNISolone acetate (DEPO-MEDROL) injection 40 mg; Inject 1 mL (40 mg total) into the muscle once. - predniSONE (DELTASONE) 5 MG tablet; Start tomorrow. Take 4 T po qam x3d, then decrease by 1 T every 3 days until taking 1 T po qam x 3 d, then d/c  Dispense: 30 tablet; Refill: 0  2. Cough, new - benzonatate (TESSALON) 200 MG capsule; Take 1 capsule (200 mg total) by mouth 3 (three) times daily as needed for cough.  Dispense: 60 capsule; Refill: 0  3. Sore throat, new -poc strep-neg - Upper Respiratory Culture  See pt instructions. F/u 10 days

## 2014-02-09 NOTE — Patient Instructions (Signed)
Start oral prednisone tomorrow. Take in morning.  OK to take tylenol if needed for pain. No NSAIDS.  Take benzonatate capsules for cough.  My office will call with lab results.  F/u in 10 days.

## 2014-02-10 LAB — HEPATITIS B SURFACE ANTIGEN: Hepatitis B Surface Ag: NEGATIVE

## 2014-02-10 LAB — ANTINUCLEAR ANTIBODIES, IFA: ANA TITER 1: NEGATIVE

## 2014-02-10 LAB — HEPATITIS C ANTIBODY: HCV Ab: NEGATIVE

## 2014-02-12 ENCOUNTER — Telehealth: Payer: Self-pay

## 2014-02-12 LAB — CULTURE, UPPER RESPIRATORY: Organism ID, Bacteria: NORMAL

## 2014-02-12 NOTE — Telephone Encounter (Signed)
pls call pt: Advise Throat culture neg. She likely has viral infection, which can cause joint pain. Labs for autoimmune disease are all negative. She should use benzocaine throat lozenge & 600 mg ibuprophen with food twice daily for discomfort.

## 2014-02-12 NOTE — Telephone Encounter (Signed)
Pt called requesting lab results. She states her daughter just got diagnosed with Fifth's disease today and was concerned about her labs. Please advise.

## 2014-02-12 NOTE — Telephone Encounter (Signed)
Spoke with pt, advised message from [place]. Pt understood. 

## 2014-03-17 ENCOUNTER — Other Ambulatory Visit: Payer: Self-pay | Admitting: Family Medicine

## 2014-04-09 ENCOUNTER — Other Ambulatory Visit: Payer: Self-pay | Admitting: Family Medicine

## 2014-05-26 ENCOUNTER — Other Ambulatory Visit: Payer: Self-pay | Admitting: Family Medicine

## 2014-07-16 ENCOUNTER — Other Ambulatory Visit: Payer: Self-pay | Admitting: Family Medicine

## 2014-07-22 ENCOUNTER — Other Ambulatory Visit: Payer: Self-pay | Admitting: Family Medicine

## 2014-07-29 ENCOUNTER — Encounter: Payer: Self-pay | Admitting: *Deleted

## 2014-07-29 ENCOUNTER — Telehealth: Payer: Self-pay | Admitting: *Deleted

## 2014-07-29 NOTE — Telephone Encounter (Signed)
Pre-Visit Call completed with patient and chart updated.   Pre-Visit Info documented in Specialty Comments under [employer].    

## 2014-07-31 ENCOUNTER — Ambulatory Visit (INDEPENDENT_AMBULATORY_CARE_PROVIDER_SITE_OTHER): Payer: BLUE CROSS/BLUE SHIELD | Admitting: Family Medicine

## 2014-07-31 ENCOUNTER — Encounter: Payer: Self-pay | Admitting: Family Medicine

## 2014-07-31 VITALS — BP 112/84 | HR 83 | Temp 98.3°F | Ht 64.0 in | Wt 159.2 lb

## 2014-07-31 DIAGNOSIS — E782 Mixed hyperlipidemia: Secondary | ICD-10-CM

## 2014-07-31 DIAGNOSIS — F418 Other specified anxiety disorders: Secondary | ICD-10-CM

## 2014-07-31 DIAGNOSIS — G43009 Migraine without aura, not intractable, without status migrainosus: Secondary | ICD-10-CM | POA: Diagnosis not present

## 2014-07-31 DIAGNOSIS — M79601 Pain in right arm: Secondary | ICD-10-CM | POA: Diagnosis not present

## 2014-07-31 DIAGNOSIS — M545 Low back pain: Secondary | ICD-10-CM

## 2014-07-31 DIAGNOSIS — G4700 Insomnia, unspecified: Secondary | ICD-10-CM

## 2014-07-31 DIAGNOSIS — Z0000 Encounter for general adult medical examination without abnormal findings: Secondary | ICD-10-CM | POA: Diagnosis not present

## 2014-07-31 DIAGNOSIS — F329 Major depressive disorder, single episode, unspecified: Secondary | ICD-10-CM

## 2014-07-31 DIAGNOSIS — E663 Overweight: Secondary | ICD-10-CM

## 2014-07-31 DIAGNOSIS — F419 Anxiety disorder, unspecified: Secondary | ICD-10-CM

## 2014-07-31 LAB — COMPREHENSIVE METABOLIC PANEL
ALK PHOS: 58 U/L (ref 39–117)
ALT: 12 U/L (ref 0–35)
AST: 17 U/L (ref 0–37)
Albumin: 4 g/dL (ref 3.5–5.2)
BUN: 13 mg/dL (ref 6–23)
CO2: 29 meq/L (ref 19–32)
CREATININE: 0.84 mg/dL (ref 0.40–1.20)
Calcium: 9.4 mg/dL (ref 8.4–10.5)
Chloride: 101 mEq/L (ref 96–112)
GFR: 80.73 mL/min (ref >60.00)
Glucose, Bld: 82 mg/dL (ref 70–99)
POTASSIUM: 4 meq/L (ref 3.5–5.1)
SODIUM: 135 meq/L (ref 135–145)
Total Bilirubin: 0.4 mg/dL (ref 0.2–1.2)
Total Protein: 7.3 g/dL (ref 6.0–8.3)

## 2014-07-31 LAB — LIPID PANEL
CHOL/HDL RATIO: 3
CHOLESTEROL: 185 mg/dL (ref 0–200)
HDL: 54.3 mg/dL (ref >39.00)
NonHDL: 130.7
TRIGLYCERIDES: 290 mg/dL — AB (ref 0.0–149.0)
VLDL: 58 mg/dL — ABNORMAL HIGH (ref 0.0–40.0)

## 2014-07-31 LAB — CBC
HEMATOCRIT: 40.4 % (ref 36.0–46.0)
HEMOGLOBIN: 13.2 g/dL (ref 12.0–15.0)
MCHC: 32.8 g/dL (ref 30.0–36.0)
MCV: 81.6 fl (ref 78.0–100.0)
Platelets: 332 10*3/uL (ref 150.0–400.0)
RBC: 4.95 Mil/uL (ref 3.87–5.11)
RDW: 13.6 % (ref 11.5–15.5)
WBC: 7.3 10*3/uL (ref 4.0–10.5)

## 2014-07-31 LAB — LDL CHOLESTEROL, DIRECT: LDL DIRECT: 98 mg/dL

## 2014-07-31 LAB — TSH: TSH: 1.06 u[IU]/mL (ref 0.35–4.50)

## 2014-07-31 MED ORDER — ZOLPIDEM TARTRATE 10 MG PO TABS: 10.0000 mg | ORAL_TABLET | Freq: Every evening | ORAL | Status: DC | PRN

## 2014-07-31 MED ORDER — ALPRAZOLAM 0.25 MG PO TABS: 0.2500 mg | ORAL_TABLET | Freq: Two times a day (BID) | ORAL | Status: DC | PRN

## 2014-07-31 MED ORDER — ESCITALOPRAM OXALATE 20 MG PO TABS: 20.0000 mg | ORAL_TABLET | Freq: Every day | ORAL | Status: DC

## 2014-07-31 NOTE — Patient Instructions (Addendum)
L tyrptophan or Valerian Root for sleep  For the arm apply heat and ice and [place] Pas gel  Encouraged good sleep hygiene such as dark, quiet room. No blue/green glowing lights such as computer screens in bedroom. No alcohol or stimulants in evening. Cut down on caffeine as able. Regular exercise is helpful but not just prior to bed time.   Insomnia Insomnia is frequent trouble falling and/or staying asleep. Insomnia can be a long term problem or a short term problem. Both are common. Insomnia can be a short term problem when the wakefulness is related to a certain stress or worry. Long term insomnia is often related to ongoing stress during waking hours and/or poor sleeping habits. Overtime, sleep deprivation itself can make the problem worse. Every little thing feels more severe because you are overtired and your ability to cope is decreased. CAUSES   Stress, anxiety, and depression.  Poor sleeping habits.  Distractions such as TV in the bedroom.  Naps close to bedtime.  Engaging in emotionally charged conversations before bed.  Technical reading before sleep.  Alcohol and other sedatives. They may make the problem worse. They can hurt normal sleep patterns and normal dream activity.  Stimulants such as caffeine for several hours prior to bedtime.  Pain syndromes and shortness of breath can cause insomnia.  Exercise late at night.  Changing time zones may cause sleeping problems (jet lag). It is sometimes helpful to have someone observe your sleeping patterns. They should look for periods of not breathing during the night (sleep apnea). They should also look to see how long those periods last. If you live alone or observers are uncertain, you can also be observed at a sleep clinic where your sleep patterns will be professionally monitored. Sleep apnea requires a checkup and treatment. Give your caregivers your medical history. Give your caregivers observations your family has made  about your sleep.  SYMPTOMS   Not feeling rested in the morning.  Anxiety and restlessness at bedtime.  Difficulty falling and staying asleep. TREATMENT   Your caregiver may prescribe treatment for an underlying medical disorders. Your caregiver can give advice or help if you are using alcohol or other drugs for self-medication. Treatment of underlying problems will usually eliminate insomnia problems.  Medications can be prescribed for short time use. They are generally not recommended for lengthy use.  Over-the-counter sleep medicines are not recommended for lengthy use. They can be habit forming.  You can promote easier sleeping by making lifestyle changes such as:  Using relaxation techniques that help with breathing and reduce muscle tension.  Exercising earlier in the day.  Changing your diet and the time of your last meal. No night time snacks.  Establish a regular time to go to bed.  Counseling can help with stressful problems and worry.  Soothing music and white noise may be helpful if there are background noises you cannot remove.  Stop tedious detailed work at least one hour before bedtime. HOME CARE INSTRUCTIONS   Keep a diary. Inform your caregiver about your progress. This includes any medication side effects. See your caregiver regularly. Take note of:  Times when you are asleep.  Times when you are awake during the night.  The quality of your sleep.  How you feel the next day. This information will help your caregiver care for you.  Get out of bed if you are still awake after 15 minutes. Read or do some quiet activity. Keep the lights down. Wait until  you feel sleepy and go back to bed.  Keep regular sleeping and waking hours. Avoid naps.  Exercise regularly.  Avoid distractions at bedtime. Distractions include watching television or engaging in any intense or detailed activity like attempting to balance the household checkbook.  Develop a bedtime  ritual. Keep a familiar routine of bathing, brushing your teeth, climbing into bed at the same time each night, listening to soothing music. Routines increase the success of falling to sleep faster.  Use relaxation techniques. This can be using breathing and muscle tension release routines. It can also include visualizing peaceful scenes. You can also help control troubling or intruding thoughts by keeping your mind occupied with boring or repetitive thoughts like the old concept of counting sheep. You can make it more creative like imagining planting one beautiful flower after another in your backyard garden.  During your day, work to eliminate stress. When this is not possible use some of the previous suggestions to help reduce the anxiety that accompanies stressful situations. MAKE SURE YOU:   Understand these instructions.  Will watch your condition.  Will get help right away if you are not doing well or get worse. Document Released: 01/28/2000 Document Revised: 04/24/2011 Document Reviewed: 02/27/2007 [facility] Patient Information 2015 [place], [place]. This information is not intended to replace advice given to you by your health care provider. Make sure you discuss any questions you have with your health care provider. Preventive Care for Adults A healthy lifestyle and preventive care can promote health and wellness. Preventive health guidelines for women include the following key practices.  A routine yearly physical is a good way to check with your health care provider about your health and preventive screening. It is a chance to share any concerns and updates on your health and to receive a thorough exam.  Visit your dentist for a routine exam and preventive care every 6 months. Brush your teeth twice a day and floss once a day. Good oral hygiene prevents tooth decay and gum disease.  The frequency of eye exams is based on your age, health, family medical history, use of contact lenses,  and other factors. Follow your health care provider's recommendations for frequency of eye exams.  Eat a healthy diet. Foods like vegetables, fruits, whole grains, low-fat dairy products, and lean protein foods contain the nutrients you need without too many calories. Decrease your intake of foods high in solid fats, added sugars, and salt. Eat the right amount of calories for you.Get information about a proper diet from your health care provider, if necessary.  Regular physical exercise is one of the most important things you can do for your health. Most adults should get at least 150 minutes of moderate-intensity exercise (any activity that increases your heart rate and causes you to sweat) each week. In addition, most adults need muscle-strengthening exercises on 2 or more days a week.  Maintain a healthy weight. The body mass index (BMI) is a screening tool to identify possible weight problems. It provides an estimate of body fat based on height and weight. Your health care provider can find your BMI and can help you achieve or maintain a healthy weight.For adults 20 years and older:  A BMI below 18.5 is considered underweight.  A BMI of 18.5 to 24.9 is normal.  A BMI of 25 to 29.9 is considered overweight.  A BMI of 30 and above is considered obese.  Maintain normal blood lipids and cholesterol levels by exercising and minimizing your  intake of saturated fat. Eat a balanced diet with plenty of fruit and vegetables. Blood tests for lipids and cholesterol should begin at age 59 and be repeated every 5 years. If your lipid or cholesterol levels are high, you are over 50, or you are at high risk for heart disease, you may need your cholesterol levels checked more frequently.Ongoing high lipid and cholesterol levels should be treated with medicines if diet and exercise are not working.  If you smoke, find out from your health care provider how to quit. If you do not use tobacco, do not  start.  Lung cancer screening is recommended for adults aged 51-80 years who are at high risk for developing lung cancer because of a history of smoking. A yearly low-dose CT scan of the lungs is recommended for people who have at least a 30-pack-year history of smoking and are a current smoker or have quit within the past 15 years. A pack year of smoking is smoking an average of 1 pack of cigarettes a day for 1 year (for example: 1 pack a day for 30 years or 2 packs a day for 15 years). Yearly screening should continue until the smoker has stopped smoking for at least 15 years. Yearly screening should be stopped for people who develop a health problem that would prevent them from having lung cancer treatment.  If you are pregnant, do not drink alcohol. If you are breastfeeding, be very cautious about drinking alcohol. If you are not pregnant and choose to drink alcohol, do not have more than 1 drink per day. One drink is considered to be 12 ounces (355 mL) of beer, 5 ounces (148 mL) of wine, or 1.5 ounces (44 mL) of liquor.  Avoid use of street drugs. Do not share needles with anyone. Ask for help if you need support or instructions about stopping the use of drugs.  High blood pressure causes heart disease and increases the risk of stroke. Your blood pressure should be checked at least every 1 to 2 years. Ongoing high blood pressure should be treated with medicines if weight loss and exercise do not work.  If you are 67-71 years old, ask your health care provider if you should take aspirin to prevent strokes.  Diabetes screening involves taking a blood sample to check your fasting blood sugar level. This should be done once every 3 years, after age 45, if you are within normal weight and without risk factors for diabetes. Testing should be considered at a younger age or be carried out more frequently if you are overweight and have at least 1 risk factor for diabetes.  Breast cancer screening is  essential preventive care for women. You should practice "breast self-awareness." This means understanding the normal appearance and feel of your breasts and may include breast self-examination. Any changes detected, no matter how small, should be reported to a health care provider. Women in their 57s and 30s should have a clinical breast exam (CBE) by a health care provider as part of a regular health exam every 1 to 3 years. After age 81, women should have a CBE every year. Starting at age 59, women should consider having a mammogram (breast X-ray test) every year. Women who have a family history of breast cancer should talk to their health care provider about genetic screening. Women at a high risk of breast cancer should talk to their health care providers about having an MRI and a mammogram every year.  Breast cancer  gene (BRCA)-related cancer risk assessment is recommended for women who have family members with BRCA-related cancers. BRCA-related cancers include breast, ovarian, tubal, and peritoneal cancers. Having family members with these cancers may be associated with an increased risk for harmful changes (mutations) in the breast cancer genes BRCA1 and BRCA2. Results of the assessment will determine the need for genetic counseling and BRCA1 and BRCA2 testing.  Routine pelvic exams to screen for cancer are no longer recommended for nonpregnant women who are considered low risk for cancer of the pelvic organs (ovaries, uterus, and vagina) and who do not have symptoms. Ask your health care provider if a screening pelvic exam is right for you.  If you have had past treatment for cervical cancer or a condition that could lead to cancer, you need Pap tests and screening for cancer for at least 20 years after your treatment. If Pap tests have been discontinued, your risk factors (such as having a new sexual partner) need to be reassessed to determine if screening should be resumed. Some women have medical  problems that increase the chance of getting cervical cancer. In these cases, your health care provider may recommend more frequent screening and Pap tests.  The HPV test is an additional test that may be used for cervical cancer screening. The HPV test looks for the virus that can cause the cell changes on the cervix. The cells collected during the Pap test can be tested for HPV. The HPV test could be used to screen women aged 81 years and older, and should be used in women of any age who have unclear Pap test results. After the age of 86, women should have HPV testing at the same frequency as a Pap test.  Colorectal cancer can be detected and often prevented. Most routine colorectal cancer screening begins at the age of 56 years and continues through age 32 years. However, your health care provider may recommend screening at an earlier age if you have risk factors for colon cancer. On a yearly basis, your health care provider may provide home test kits to check for hidden blood in the stool. Use of a small camera at the end of a tube, to directly examine the colon (sigmoidoscopy or colonoscopy), can detect the earliest forms of colorectal cancer. Talk to your health care provider about this at age 1, when routine screening begins. Direct exam of the colon should be repeated every 5-10 years through age 56 years, unless early forms of pre-cancerous polyps or small growths are found.  People who are at an increased risk for hepatitis B should be screened for this virus. You are considered at high risk for hepatitis B if:  You were born in a country where hepatitis B occurs often. Talk with your health care provider about which countries are considered high risk.  Your parents were born in a high-risk country and you have not received a shot to protect against hepatitis B (hepatitis B vaccine).  You have HIV or AIDS.  You use needles to inject street drugs.  You live with, or have sex with, someone  who has hepatitis B.  You get hemodialysis treatment.  You take certain medicines for conditions like cancer, organ transplantation, and autoimmune conditions.  Hepatitis C blood testing is recommended for all people born from [birth year] through 1965 and any individual with known risks for hepatitis C.  Practice safe sex. Use condoms and avoid high-risk sexual practices to reduce the spread of sexually transmitted infections (STIs).  STIs include gonorrhea, chlamydia, syphilis, trichomonas, herpes, HPV, and human immunodeficiency virus (HIV). Herpes, HIV, and HPV are viral illnesses that have no cure. They can result in disability, cancer, and death.  You should be screened for sexually transmitted illnesses (STIs) including gonorrhea and chlamydia if:  You are sexually active and are younger than 24 years.  You are older than 24 years and your health care provider tells you that you are at risk for this type of infection.  Your sexual activity has changed since you were last screened and you are at an increased risk for chlamydia or gonorrhea. Ask your health care provider if you are at risk.  If you are at risk of being infected with HIV, it is recommended that you take a prescription medicine daily to prevent HIV infection. This is called preexposure prophylaxis (PrEP). You are considered at risk if:  You are a heterosexual woman, are sexually active, and are at increased risk for HIV infection.  You take drugs by injection.  You are sexually active with a partner who has HIV.  Talk with your health care provider about whether you are at high risk of being infected with HIV. If you choose to begin PrEP, you should first be tested for HIV. You should then be tested every 3 months for as long as you are taking PrEP.  Osteoporosis is a disease in which the bones lose minerals and strength with aging. This can result in serious bone fractures or breaks. The risk of osteoporosis can be identified  using a bone density scan. Women ages 9 years and over and women at risk for fractures or osteoporosis should discuss screening with their health care providers. Ask your health care provider whether you should take a calcium supplement or vitamin D to reduce the rate of osteoporosis.  Menopause can be associated with physical symptoms and risks. Hormone replacement therapy is available to decrease symptoms and risks. You should talk to your health care provider about whether hormone replacement therapy is right for you.  Use sunscreen. Apply sunscreen liberally and repeatedly throughout the day. You should seek shade when your shadow is shorter than you. Protect yourself by wearing long sleeves, pants, a wide-brimmed hat, and sunglasses year round, whenever you are outdoors.  Once a month, do a whole body skin exam, using a mirror to look at the skin on your back. Tell your health care provider of new moles, moles that have irregular borders, moles that are larger than a pencil eraser, or moles that have changed in shape or color.  Stay current with required vaccines (immunizations).  Influenza vaccine. All adults should be immunized every year.  Tetanus, diphtheria, and acellular pertussis (Td, Tdap) vaccine. Pregnant women should receive 1 dose of Tdap vaccine during each pregnancy. The dose should be obtained regardless of the length of time since the last dose. Immunization is preferred during the 27th-36th week of gestation. An adult who has not previously received Tdap or who does not know her vaccine status should receive 1 dose of Tdap. This initial dose should be followed by tetanus and diphtheria toxoids (Td) booster doses every 10 years. Adults with an unknown or incomplete history of completing a 3-dose immunization series with Td-containing vaccines should begin or complete a primary immunization series including a Tdap dose. Adults should receive a Td booster every 10 years.  Varicella  vaccine. An adult without evidence of immunity to varicella should receive 2 doses or a second dose if  she has previously received 1 dose. Pregnant females who do not have evidence of immunity should receive the first dose after pregnancy. This first dose should be obtained before leaving the health care facility. The second dose should be obtained 4-8 weeks after the first dose.  Human papillomavirus (HPV) vaccine. Females aged 13-26 years who have not received the vaccine previously should obtain the 3-dose series. The vaccine is not recommended for use in pregnant females. However, pregnancy testing is not needed before receiving a dose. If a female is found to be pregnant after receiving a dose, no treatment is needed. In that case, the remaining doses should be delayed until after the pregnancy. Immunization is recommended for any person with an immunocompromised condition through the age of 77 years if she did not get any or all doses earlier. During the 3-dose series, the second dose should be obtained 4-8 weeks after the first dose. The third dose should be obtained 24 weeks after the first dose and 16 weeks after the second dose.  Zoster vaccine. One dose is recommended for adults aged 42 years or older unless certain conditions are present.  Measles, mumps, and rubella (MMR) vaccine. Adults born before [birth year] generally are considered immune to measles and mumps. Adults born in [birth year] or later should have 1 or more doses of MMR vaccine unless there is a contraindication to the vaccine or there is laboratory evidence of immunity to each of the three diseases. A routine second dose of MMR vaccine should be obtained at least 28 days after the first dose for students attending postsecondary schools, health care workers, or international travelers. People who received inactivated measles vaccine or an unknown type of measles vaccine during 1963-1967 should receive 2 doses of MMR vaccine. People who received  inactivated mumps vaccine or an unknown type of mumps vaccine before 1979 and are at high risk for mumps infection should consider immunization with 2 doses of MMR vaccine. For females of childbearing age, rubella immunity should be determined. If there is no evidence of immunity, females who are not pregnant should be vaccinated. If there is no evidence of immunity, females who are pregnant should delay immunization until after pregnancy. Unvaccinated health care workers born before [birth year] who lack laboratory evidence of measles, mumps, or rubella immunity or laboratory confirmation of disease should consider measles and mumps immunization with 2 doses of MMR vaccine or rubella immunization with 1 dose of MMR vaccine.  Pneumococcal 13-valent conjugate (PCV13) vaccine. When indicated, a person who is uncertain of her immunization history and has no record of immunization should receive the PCV13 vaccine. An adult aged 65 years or older who has certain medical conditions and has not been previously immunized should receive 1 dose of PCV13 vaccine. This PCV13 should be followed with a dose of pneumococcal polysaccharide (PPSV23) vaccine. The PPSV23 vaccine dose should be obtained at least 8 weeks after the dose of PCV13 vaccine. An adult aged 64 years or older who has certain medical conditions and previously received 1 or more doses of PPSV23 vaccine should receive 1 dose of PCV13. The PCV13 vaccine dose should be obtained 1 or more years after the last PPSV23 vaccine dose.  Pneumococcal polysaccharide (PPSV23) vaccine. When PCV13 is also indicated, PCV13 should be obtained first. All adults aged 84 years and older should be immunized. An adult younger than age 5 years who has certain medical conditions should be immunized. Any person who resides in a nursing home or long-term care facility  should be immunized. An adult smoker should be immunized. People with an immunocompromised condition and certain other  conditions should receive both PCV13 and PPSV23 vaccines. People with human immunodeficiency virus (HIV) infection should be immunized as soon as possible after diagnosis. Immunization during chemotherapy or radiation therapy should be avoided. Routine use of PPSV23 vaccine is not recommended for American Indians, [place]atives, or people younger than 65 years unless there are medical conditions that require PPSV23 vaccine. When indicated, people who have unknown immunization and have no record of immunization should receive PPSV23 vaccine. One-time revaccination 5 years after the first dose of PPSV23 is recommended for people aged 19-64 years who have chronic kidney failure, nephrotic syndrome, asplenia, or immunocompromised conditions. People who received 1-2 doses of PPSV23 before age 37 years should receive another dose of PPSV23 vaccine at age 40 years or later if at least 5 years have passed since the previous dose. Doses of PPSV23 are not needed for people immunized with PPSV23 at or after age 76 years.  Meningococcal vaccine. Adults with asplenia or persistent complement component deficiencies should receive 2 doses of quadrivalent meningococcal conjugate (MenACWY-D) vaccine. The doses should be obtained at least 2 months apart. Microbiologists working with certain meningococcal bacteria, [place] recruits, people at risk during an outbreak, and people who travel to or live in countries with a high rate of meningitis should be immunized. A first-year college student up through age 62 years who is living in a residence hall should receive a dose if she did not receive a dose on or after her 16th birthday. Adults who have certain high-risk conditions should receive one or more doses of vaccine.  Hepatitis A vaccine. Adults who wish to be protected from this disease, have certain high-risk conditions, work with hepatitis A-infected animals, work in hepatitis A research labs, or travel to or work in  countries with a high rate of hepatitis A should be immunized. Adults who were previously unvaccinated and who anticipate close contact with an international adoptee during the first 60 days after arrival in the [place] from a country with a high rate of hepatitis A should be immunized.  Hepatitis B vaccine. Adults who wish to be protected from this disease, have certain high-risk conditions, may be exposed to blood or other infectious body fluids, are household contacts or sex partners of hepatitis B positive people, are clients or workers in certain care facilities, or travel to or work in countries with a high rate of hepatitis B should be immunized.  Haemophilus influenzae type b (Hib) vaccine. A previously unvaccinated person with asplenia or sickle cell disease or having a scheduled splenectomy should receive 1 dose of Hib vaccine. Regardless of previous immunization, a recipient of a hematopoietic stem cell transplant should receive a 3-dose series 6-12 months after her successful transplant. Hib vaccine is not recommended for adults with HIV infection. Preventive Services / Frequency Ages 22 to 70 years  Blood pressure check.** / Every 1 to 2 years.  Lipid and cholesterol check.** / Every 5 years beginning at age 3.  Clinical breast exam.** / Every 3 years for women in their 78s and 61s.  BRCA-related cancer risk assessment.** / For women who have family members with a BRCA-related cancer (breast, ovarian, tubal, or peritoneal cancers).  Pap test.** / Every 2 years from ages 35 through 43. Every 3 years starting at age 39 through age 47 or 64 with a history of 3 consecutive normal Pap tests.  HPV screening.** /  Every 3 years from ages 56 through ages 12 to 67 with a history of 3 consecutive normal Pap tests.  Hepatitis C blood test.** / For any individual with known risks for hepatitis C.  Skin self-exam. / Monthly.  Influenza vaccine. / Every year.  Tetanus, diphtheria, and  acellular pertussis (Tdap, Td) vaccine.** / Consult your health care provider. Pregnant women should receive 1 dose of Tdap vaccine during each pregnancy. 1 dose of Td every 10 years.  Varicella vaccine.** / Consult your health care provider. Pregnant females who do not have evidence of immunity should receive the first dose after pregnancy.  HPV vaccine. / 3 doses over 6 months, if 47 and younger. The vaccine is not recommended for use in pregnant females. However, pregnancy testing is not needed before receiving a dose.  Measles, mumps, rubella (MMR) vaccine.** / You need at least 1 dose of MMR if you were born in 1957 or later. You may also need a 2nd dose. For females of childbearing age, rubella immunity should be determined. If there is no evidence of immunity, females who are not pregnant should be vaccinated. If there is no evidence of immunity, females who are pregnant should delay immunization until after pregnancy.  Pneumococcal 13-valent conjugate (PCV13) vaccine.** / Consult your health care provider.  Pneumococcal polysaccharide (PPSV23) vaccine.** / 1 to 2 doses if you smoke cigarettes or if you have certain conditions.  Meningococcal vaccine.** / 1 dose if you are age 73 to 62 years and a Market researcher living in a residence hall, or have one of several medical conditions, you need to get vaccinated against meningococcal disease. You may also need additional booster doses.  Hepatitis A vaccine.** / Consult your health care provider.  Hepatitis B vaccine.** / Consult your health care provider.  Haemophilus influenzae type b (Hib) vaccine.** / Consult your health care provider. Ages 67 to 5 years  Blood pressure check.** / Every 1 to 2 years.  Lipid and cholesterol check.** / Every 5 years beginning at age 17 years.  Lung cancer screening. / Every year if you are aged 34-80 years and have a 30-pack-year history of smoking and currently smoke or have quit within the  past 15 years. Yearly screening is stopped once you have quit smoking for at least 15 years or develop a health problem that would prevent you from having lung cancer treatment.  Clinical breast exam.** / Every year after age 28 years.  BRCA-related cancer risk assessment.** / For women who have family members with a BRCA-related cancer (breast, ovarian, tubal, or peritoneal cancers).  Mammogram.** / Every year beginning at age 59 years and continuing for as long as you are in good health. Consult with your health care provider.  Pap test.** / Every 3 years starting at age 45 years through age 19 or 49 years with a history of 3 consecutive normal Pap tests.  HPV screening.** / Every 3 years from ages 65 years through ages 47 to 65 years with a history of 3 consecutive normal Pap tests.  Fecal occult blood test (FOBT) of stool. / Every year beginning at age 69 years and continuing until age 51 years. You may not need to do this test if you get a colonoscopy every 10 years.  Flexible sigmoidoscopy or colonoscopy.** / Every 5 years for a flexible sigmoidoscopy or every 10 years for a colonoscopy beginning at age 65 years and continuing until age 4 years.  Hepatitis C blood test.** / For  all people born from [birth year] through 1965 and any individual with known risks for hepatitis C.  Skin self-exam. / Monthly.  Influenza vaccine. / Every year.  Tetanus, diphtheria, and acellular pertussis (Tdap/Td) vaccine.** / Consult your health care provider. Pregnant women should receive 1 dose of Tdap vaccine during each pregnancy. 1 dose of Td every 10 years.  Varicella vaccine.** / Consult your health care provider. Pregnant females who do not have evidence of immunity should receive the first dose after pregnancy.  Zoster vaccine.** / 1 dose for adults aged 49 years or older.  Measles, mumps, rubella (MMR) vaccine.** / You need at least 1 dose of MMR if you were born in 1957 or later. You may also need a  2nd dose. For females of childbearing age, rubella immunity should be determined. If there is no evidence of immunity, females who are not pregnant should be vaccinated. If there is no evidence of immunity, females who are pregnant should delay immunization until after pregnancy.  Pneumococcal 13-valent conjugate (PCV13) vaccine.** / Consult your health care provider.  Pneumococcal polysaccharide (PPSV23) vaccine.** / 1 to 2 doses if you smoke cigarettes or if you have certain conditions.  Meningococcal vaccine.** / Consult your health care provider.  Hepatitis A vaccine.** / Consult your health care provider.  Hepatitis B vaccine.** / Consult your health care provider.  Haemophilus influenzae type b (Hib) vaccine.** / Consult your health care provider. Ages 69 years and over  Blood pressure check.** / Every 1 to 2 years.  Lipid and cholesterol check.** / Every 5 years beginning at age 55 years.  Lung cancer screening. / Every year if you are aged 88-80 years and have a 30-pack-year history of smoking and currently smoke or have quit within the past 15 years. Yearly screening is stopped once you have quit smoking for at least 15 years or develop a health problem that would prevent you from having lung cancer treatment.  Clinical breast exam.** / Every year after age 51 years.  BRCA-related cancer risk assessment.** / For women who have family members with a BRCA-related cancer (breast, ovarian, tubal, or peritoneal cancers).  Mammogram.** / Every year beginning at age 71 years and continuing for as long as you are in good health. Consult with your health care provider.  Pap test.** / Every 3 years starting at age 49 years through age 10 or 66 years with 3 consecutive normal Pap tests. Testing can be stopped between 65 and 70 years with 3 consecutive normal Pap tests and no abnormal Pap or HPV tests in the past 10 years.  HPV screening.** / Every 3 years from ages 42 years through ages  29 or 65 years with a history of 3 consecutive normal Pap tests. Testing can be stopped between 65 and 70 years with 3 consecutive normal Pap tests and no abnormal Pap or HPV tests in the past 10 years.  Fecal occult blood test (FOBT) of stool. / Every year beginning at age 76 years and continuing until age 69 years. You may not need to do this test if you get a colonoscopy every 10 years.  Flexible sigmoidoscopy or colonoscopy.** / Every 5 years for a flexible sigmoidoscopy or every 10 years for a colonoscopy beginning at age 83 years and continuing until age 27 years.  Hepatitis C blood test.** / For all people born from [birth year] through 1964 and any individual with known risks for hepatitis C.  Osteoporosis screening.** / A one-time screening for women ages  87 years and over and women at risk for fractures or osteoporosis.  Skin self-exam. / Monthly.  Influenza vaccine. / Every year.  Tetanus, diphtheria, and acellular pertussis (Tdap/Td) vaccine.** / 1 dose of Td every 10 years.  Varicella vaccine.** / Consult your health care provider.  Zoster vaccine.** / 1 dose for adults aged 45 years or older.  Pneumococcal 13-valent conjugate (PCV13) vaccine.** / Consult your health care provider.  Pneumococcal polysaccharide (PPSV23) vaccine.** / 1 dose for all adults aged 55 years and older.  Meningococcal vaccine.** / Consult your health care provider.  Hepatitis A vaccine.** / Consult your health care provider.  Hepatitis B vaccine.** / Consult your health care provider.  Haemophilus influenzae type b (Hib) vaccine.** / Consult your health care provider. ** Family history and personal history of risk and conditions may change your health care provider's recommendations. Document Released: 03/28/2001 Document Revised: 06/16/2013 Document Reviewed: 06/27/2010 [facility] Patient Information 2015 [place], [place]. This information is not intended to replace advice given to you by your health care  provider. Make sure you discuss any questions you have with your health care provider.

## 2014-07-31 NOTE — Progress Notes (Signed)
Pre visit review using our clinic review tool, if applicable. No additional management support is needed unless otherwise documented below in the visit note. 

## 2014-07-31 NOTE — Progress Notes (Signed)
Madeline Murphy  [id number] 1975-08-07 07/31/2014      Progress Note-Follow Up  Subjective  Chief Complaint  Chief Complaint  Patient presents with  . Annual Exam    HPI  Patient is a 39 y.o. female in today for routine medical care. Patient is in today for annual exam and is struggling with the recent passing of her father unexpectedly. She was the one that found him. She did increase her Lexapro on her own in the last week and already feels it might be helping a little bit. She acknowledges she is having trouble concentrating and cries easily. She struggles with anhedonia but she denies any suicidal ideation. She notes some fatigue and malaise. No recent illness or acute complaints except for some right arm pain. She notes with repetitive overuse of the right arm she gets pain in elbow worse with rotation. No redness, swelling or trauma. Denies CP/palp/SOB/HA/congestion/fevers/GI or GU c/o. Taking meds as prescribed  Past Medical History  Diagnosis Date  . Bronchitis 01/30/2011  . Vaginitis 06/02/2011  . Sinusitis 02/19/2012  . Strep pharyngitis 02/19/2012  . Overweight(278.02) 06/02/2012  . Other and unspecified hyperlipidemia 09/12/2012  . Insomnia 09/12/2012  . RLS (restless legs syndrome) 09/12/2012  . Depression with anxiety 09/12/2012    With some PMS components    Past Surgical History  Procedure Laterality Date  . Chronc compartmenal syndrome      S/P ortho surgery x3    Family History  Problem Relation Age of Onset  . Coronary artery disease    . Hypertension    . Hyperlipidemia Mother   . Heart disease Mother     palpitation  . Hyperlipidemia Father   . Hypertension Father   . Heart disease Father     s/p bypass and stent, passed from MI 06/24/14  . Otitis media Daughter     recurrent strep  . Cancer Maternal Aunt     breast  . Cancer Maternal Grandfather     possibly stomach  . Aneurysm Paternal Grandmother     likely brain  . Cancer Paternal Grandmother    breast  . Otitis media Daughter     History   Social History  . Marital Status: Married    Spouse Name: N/A  . Number of Children: N/A  . Years of Education: N/A   Occupational History  . Not on file.   Social History Main Topics  . Smoking status: Never Smoker   . Smokeless tobacco: Never Used  . Alcohol Use: Yes  . Drug Use: No  . Sexual Activity: Yes    Birth Control/ Protection: Pill   Other Topics Concern  . Not on file   Social History Narrative    Current Outpatient Prescriptions on File Prior to Visit  Medication Sig Dispense Refill  . ALPRAZolam (XANAX) 0.25 MG tablet Take 1 tablet (0.25 mg total) by mouth 2 (two) times daily as needed for sleep or anxiety. 20 tablet 0  . cyclobenzaprine (FLEXERIL) 10 MG tablet TAKE 1 TABLET (10 MG TOTAL) BY MOUTH AT BEDTIME AS NEEDED FOR MUSCLE SPASMS. 20 tablet 0  . escitalopram (LEXAPRO) 10 MG tablet TAKE 1 TABLET BY MOUTH DAILY FOR 20 DAYS THEN 2 TABLETS BY MOUTH FOR 10 DAYS 40 tablet 2  . ethynodiol-ethinyl estradiol (KELNOR,ZOVIA) 1-35 MG-MCG tablet Take 1 tablet by mouth daily.    Marland Kitchen ibuprofen (ADVIL,MOTRIN) 200 MG tablet Take 400 mg by mouth once as needed. For pain     .  Multiple Vitamins-Minerals (MULTIVITAMIN PO) Take by mouth daily.    Marland Kitchen OVER THE COUNTER MEDICATION Take 2 capsules by mouth daily. PROBIOTIC 11    . rOPINIRole (REQUIP) 1 MG tablet TAKE 1 TABLET BY MOUTH 3 TIMES DAILY 90 tablet 1  . zolpidem (AMBIEN) 10 MG tablet Take 1 tablet (10 mg total) by mouth at bedtime as needed. 30 tablet 5  . [DISCONTINUED] albuterol (VENTOLIN HFA) 108 (90 BASE) MCG/ACT inhaler Inhale 2 puffs into the lungs every 6 (six) hours as needed for wheezing. 1 Inhaler 0  . [DISCONTINUED] Almotriptan Malate (AXERT PO) Take by mouth as needed.      . [DISCONTINUED] fluticasone (FLONASE) 50 MCG/ACT nasal spray Place 1 spray into the nose 2 (two) times daily. 16 g 1  . [DISCONTINUED] Mometasone Furo-Formoterol Fum 200-5 MCG/ACT AERO 1-2   inhalations every 12 hours; gargle and spit after use 8.8 g 0  . [DISCONTINUED] topiramate (TOPAMAX) 50 MG tablet Take by mouth 2 (two) times daily. Take 3 tab  At bedtime for migraines      No current facility-administered medications on file prior to visit.    Allergies  Allergen Reactions  . Codeine Nausea Only    Syncope     Review of Systems  Review of Systems  Constitutional: Positive for malaise/fatigue. Negative for fever and chills.  HENT: Negative for congestion, hearing loss and nosebleeds.   Eyes: Negative for discharge.  Respiratory: Negative for cough, sputum production, shortness of breath and wheezing.   Cardiovascular: Negative for chest pain, palpitations and leg swelling.  Gastrointestinal: Negative for heartburn, nausea, vomiting, abdominal pain, diarrhea, constipation and blood in stool.  Genitourinary: Negative for dysuria, urgency, frequency and hematuria.  Musculoskeletal: Positive for joint pain. Negative for myalgias, back pain and falls.  Skin: Negative for rash.  Neurological: Negative for dizziness, tremors, sensory change, focal weakness, loss of consciousness, weakness and headaches.  Endo/Heme/Allergies: Negative for polydipsia. Does not bruise/bleed easily.  Psychiatric/Behavioral: Positive for depression. Negative for suicidal ideas. The patient is nervous/anxious and has insomnia.     Objective  BP 112/84 mmHg  Pulse 83  Temp(Src) 98.3 F (36.8 C) (Oral)  Ht 5\' 4"  (1.626 m)  Wt 159 lb 4 oz (72.235 kg)  BMI 27.32 kg/m2  SpO2 98%  Physical Exam  Physical Exam  Constitutional: She is oriented to person, place, and time and well-developed, well-nourished, and in no distress. No distress.  HENT:  Head: Normocephalic and atraumatic.  Eyes: Conjunctivae are normal.  Neck: Neck supple. No thyromegaly present.  Cardiovascular: Normal rate, regular rhythm and normal heart sounds.   No murmur heard. Pulmonary/Chest: Effort normal and breath  sounds normal. She has no wheezes.  Abdominal: She exhibits no distension and no mass.  Musculoskeletal: She exhibits no edema.  Lymphadenopathy:    She has no cervical adenopathy.  Neurological: She is alert and oriented to person, place, and time.  Skin: Skin is warm and dry. No rash noted. She is not diaphoretic.  Psychiatric: Memory, affect and judgment normal.    Lab Results  Component Value Date   TSH 1.44 01/22/2014   Lab Results  Component Value Date   WBC 7.2 02/09/2014   HGB 11.8* 02/09/2014   HCT 36.2 02/09/2014   MCV 80.7 02/09/2014   PLT 347.0 02/09/2014   Lab Results  Component Value Date   CREATININE 0.8 02/09/2014   BUN 9 02/09/2014   NA 138 02/09/2014   K 4.1 02/09/2014   CL 105 02/09/2014  CO2 29 02/09/2014   Lab Results  Component Value Date   ALT 23 02/09/2014   AST 21 02/09/2014   ALKPHOS 65 02/09/2014   BILITOT 0.3 02/09/2014   Lab Results  Component Value Date   CHOL 215* 01/22/2014   Lab Results  Component Value Date   HDL 57.90 01/22/2014   Lab Results  Component Value Date   LDLCALC 89 09/04/2012   Lab Results  Component Value Date   TRIG 248.0* 01/22/2014   Lab Results  Component Value Date   CHOLHDL 4 01/22/2014     Assessment & Plan  Preventative health care Patient encouraged to maintain heart healthy diet, regular exercise, adequate sleep. Consider daily probiotics. Take medications as prescribed. Labs reviewed. Given and reviewed copy of ACP documents from [employer] and encouraged to complete and return  Depression with anxiety Father has just died suddenly and she found him. She has increased her Lexapro. Will continue with higher dose and follow up in 2-3 months, consider counseling for grief.  Back pain Encouraged moist heat and gentle stretching as tolerated. May try NSAIDs and prescription meds as directed and report if symptoms worsen or seek immediate care  Overweight Encouraged DASH diet,  decrease po intake and increase exercise as tolerated. Needs 7-8 hours of sleep nightly. Avoid trans fats, eat small, frequent meals every 4-5 hours with lean proteins, complex carbs and healthy fats. Minimize simple carbs  Mixed hyperlipidemia Encouraged heart healthy diet, increase exercise, avoid trans fats, consider a krill oil cap daily  Insomnia Encouraged good sleep hygiene such as dark, quiet room. No blue/green glowing lights such as computer screens in bedroom. No alcohol or stimulants in evening. Cut down on caffeine as able. Regular exercise is helpful but not just prior to bed time.   Migraine without aura Encouraged increased hydration, 64 ounces of clear fluids daily. Minimize alcohol and caffeine. Eat small frequent meals with lean proteins and complex carbs. Avoid high and low blood sugars. Get adequate sleep, 7-8 hours a night. Needs exercise daily preferably in the morning.  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 Report if worsens for referral

## 2014-08-03 ENCOUNTER — Encounter: Payer: Self-pay | Admitting: Family Medicine

## 2014-08-03 ENCOUNTER — Other Ambulatory Visit: Payer: Self-pay | Admitting: Family Medicine

## 2014-08-03 DIAGNOSIS — M25511 Pain in right shoulder: Secondary | ICD-10-CM

## 2014-08-03 DIAGNOSIS — M79601 Pain in right arm: Secondary | ICD-10-CM

## 2014-08-03 HISTORY — DX: Pain in right shoulder: M25.511

## 2014-08-03 HISTORY — DX: Pain in right arm: M79.601

## 2014-08-03 NOTE — Assessment & Plan Note (Signed)
Patient encouraged to maintain heart healthy diet, regular exercise, adequate sleep. Consider daily probiotics. Take medications as prescribed. Labs reviewed. Given and reviewed copy of ACP documents from [place] Secretary of State and encouraged to complete and return 

## 2014-08-03 NOTE — Assessment & Plan Note (Signed)
Encouraged DASH diet, decrease po intake and increase exercise as tolerated. Needs 7-8 hours of sleep nightly. Avoid trans fats, eat small, frequent meals every 4-5 hours with lean proteins, complex carbs and healthy fats. Minimize simple carbs 

## 2014-08-03 NOTE — Assessment & Plan Note (Signed)
Father has just died suddenly and she found him. She has increased her Lexapro. Will continue with higher dose and follow up in 2-3 months, consider counseling for grief.

## 2014-08-03 NOTE — Assessment & Plan Note (Signed)
Encouraged moist heat and gentle stretching as tolerated. May try NSAIDs and prescription meds as directed and report if symptoms worsen or seek immediate care 

## 2014-08-03 NOTE — Assessment & Plan Note (Signed)
Encouraged increased hydration, 64 ounces of clear fluids daily. Minimize alcohol and caffeine. Eat small frequent meals with lean proteins and complex carbs. Avoid high and low blood sugars. Get adequate sleep, 7-8 hours a night. Needs exercise daily preferably in the morning.  

## 2014-08-03 NOTE — Assessment & Plan Note (Signed)
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 Report if worsens for referral

## 2014-08-03 NOTE — Assessment & Plan Note (Signed)
Encouraged good sleep hygiene such as dark, quiet room. No blue/green glowing lights such as computer screens in bedroom. No alcohol or stimulants in evening. Cut down on caffeine as able. Regular exercise is helpful but not just prior to bed time.  

## 2014-08-03 NOTE — Assessment & Plan Note (Signed)
Encouraged heart healthy diet, increase exercise, avoid trans fats, consider a krill oil cap daily 

## 2014-08-05 ENCOUNTER — Other Ambulatory Visit: Payer: Self-pay | Admitting: Family Medicine

## 2014-08-06 ENCOUNTER — Other Ambulatory Visit: Payer: Self-pay | Admitting: Family Medicine

## 2014-08-06 NOTE — Telephone Encounter (Signed)
Faxed hardcopy for Alprazolam to [facility] [place] 

## 2014-08-24 ENCOUNTER — Telehealth: Payer: Self-pay | Admitting: Family Medicine

## 2014-08-24 ENCOUNTER — Other Ambulatory Visit: Payer: Self-pay | Admitting: Family Medicine

## 2014-08-24 DIAGNOSIS — G4700 Insomnia, unspecified: Secondary | ICD-10-CM

## 2014-08-24 NOTE — Telephone Encounter (Signed)
Called left message to call back 

## 2014-08-24 NOTE — Telephone Encounter (Signed)
Relation to pt: self Call back number: [phone] Pharmacy: [id number] [place], [place] [phone] (Phone) [phone] (Fax)         Reason for call:  Pt requesting a refill zolpidem (AMBIEN) 10 MG tablet.

## 2014-08-25 MED ORDER — ZOLPIDEM TARTRATE 10 MG PO TABS: 10.0000 mg | ORAL_TABLET | Freq: Every evening | ORAL | Status: DC | PRN

## 2014-08-25 MED ORDER — ALPRAZOLAM 0.25 MG PO TABS: ORAL_TABLET | ORAL | Status: DC

## 2014-08-25 NOTE — Telephone Encounter (Signed)
Faxed hardcopy for Both Alprazolam and Zolpidem both #30 with 1 refill to [facility].  Patient has been contacted refill done.

## 2014-08-25 NOTE — Addendum Note (Signed)
Addended by: Sharon Seller B on: 08/25/2014 01:04 PM   Modules accepted: Orders

## 2014-08-25 NOTE — Telephone Encounter (Signed)
Madeline Murphy called back. She found the paperwork and receipt from 07/31/14 visit but no RX is attached or with the paperwork. She said that the [place] and xanax prescriptions would have been together and is asking for them to be rewritten.

## 2014-08-25 NOTE — Telephone Encounter (Signed)
Patient returned my call and she was requesting a refill on zolpidem.  I informed her she was seen by PCP on 07/31/14 and given #30 with 5 refills that day on that day.  She stated she did not realize the refill was in her paperwork.  She will check through her paper work and hopefully find it.

## 2014-08-25 NOTE — Telephone Encounter (Signed)
I am willing to write her a 30 day supply with 1 rf on both meds, same strength, same sig, same number

## 2014-08-25 NOTE — Telephone Encounter (Signed)
Read all messages below and advise on refill of controlled medication.

## 2014-08-25 NOTE — Telephone Encounter (Signed)
Called left message to call back 

## 2014-08-25 NOTE — Telephone Encounter (Signed)
[facility] and [facility] stated they recd RX for lexapro 07/31/14 but they have no record of ambien or xanax refills/rx at or after that time.

## 2014-09-17 ENCOUNTER — Other Ambulatory Visit: Payer: Self-pay | Admitting: Family Medicine

## 2014-10-09 ENCOUNTER — Encounter: Payer: Self-pay | Admitting: Family Medicine

## 2014-10-09 ENCOUNTER — Ambulatory Visit (INDEPENDENT_AMBULATORY_CARE_PROVIDER_SITE_OTHER): Payer: BLUE CROSS/BLUE SHIELD | Admitting: Family Medicine

## 2014-10-09 VITALS — BP 129/69 | HR 72 | Temp 98.2°F | Ht 64.0 in | Wt 162.2 lb

## 2014-10-09 DIAGNOSIS — F418 Other specified anxiety disorders: Secondary | ICD-10-CM

## 2014-10-09 DIAGNOSIS — G2581 Restless legs syndrome: Secondary | ICD-10-CM

## 2014-10-09 DIAGNOSIS — E782 Mixed hyperlipidemia: Secondary | ICD-10-CM

## 2014-10-09 DIAGNOSIS — Z79899 Other long term (current) drug therapy: Secondary | ICD-10-CM | POA: Diagnosis not present

## 2014-10-09 LAB — HEPATIC FUNCTION PANEL
ALBUMIN: 3.9 g/dL (ref 3.6–5.1)
ALK PHOS: 51 U/L (ref 33–115)
ALT: 10 U/L (ref 6–29)
AST: 14 U/L (ref 10–30)
BILIRUBIN INDIRECT: 0.3 mg/dL (ref 0.2–1.2)
BILIRUBIN TOTAL: 0.4 mg/dL (ref 0.2–1.2)
Bilirubin, Direct: 0.1 mg/dL (ref <=0.2)
Total Protein: 6.7 g/dL (ref 6.1–8.1)

## 2014-10-09 LAB — MAGNESIUM: MAGNESIUM: 2 mg/dL (ref 1.5–2.5)

## 2014-10-09 MED ORDER — ALPRAZOLAM 0.25 MG PO TABS: ORAL_TABLET | ORAL | Status: DC

## 2014-10-09 NOTE — Progress Notes (Signed)
Subjective:    Patient ID: Madeline Murphy, female    DOB: Jul 02, 1975, 39 y.o.   MRN: [id number]  Chief Complaint  Patient presents with  . Follow-up    HPI Patient is in today for 39 yo female in today for follow up. She is still struggling with the recent loss of her father, is burying him soon. Has stopped the lexapro because it was not helping, no worsening symptoms at this time. Is using several herbal medications to help. No suicidal ideation or anhedonia. No other new complaints. Denies CP/palp/SOB/HA/congestion/fevers/GI or GU c/o. Taking meds as prescribed  Past Medical History  Diagnosis Date  . Bronchitis 01/30/2011  . Vaginitis 06/02/2011  . Sinusitis 02/19/2012  . Strep pharyngitis 02/19/2012  . Overweight(278.02) 06/02/2012  . Other and unspecified hyperlipidemia 09/12/2012  . Insomnia 09/12/2012  . RLS (restless legs syndrome) 09/12/2012  . Depression with anxiety 09/12/2012    With some PMS components  . Right arm pain 08/03/2014    Past Surgical History  Procedure Laterality Date  . Chronc compartmenal syndrome      S/P ortho surgery x3    Family History  Problem Relation Age of Onset  . Coronary artery disease    . Hypertension    . Hyperlipidemia Mother   . Heart disease Mother     palpitation  . Hyperlipidemia Father   . Hypertension Father   . Heart disease Father     s/p bypass and stent, passed from MI 06/24/14  . Otitis media Daughter     recurrent strep  . Cancer Maternal Aunt     breast  . Cancer Maternal Grandfather     possibly stomach  . Aneurysm Paternal Grandmother     likely brain  . Cancer Paternal Grandmother     breast  . Otitis media Daughter     Social History   Social History  . Marital Status: Married    Spouse Name: N/A  . Number of Children: N/A  . Years of Education: N/A   Occupational History  . Not on file.   Social History Main Topics  . Smoking status: Never Smoker   . Smokeless tobacco: Never Used  . Alcohol Use:  Yes  . Drug Use: No  . Sexual Activity: Yes    Birth Control/ Protection: Pill   Other Topics Concern  . Not on file   Social History Narrative    Outpatient Prescriptions Prior to Visit  Medication Sig Dispense Refill  . ethynodiol-ethinyl estradiol (KELNOR,ZOVIA) 1-35 MG-MCG tablet Take 1 tablet by mouth daily.    Marland Kitchen ibuprofen (ADVIL,MOTRIN) 200 MG tablet Take 400 mg by mouth once as needed. For pain     . Multiple Vitamins-Minerals (MULTIVITAMIN PO) Take by mouth daily.    Marland Kitchen OVER THE COUNTER MEDICATION Take 2 capsules by mouth daily. PROBIOTIC 11    . rOPINIRole (REQUIP) 1 MG tablet TAKE 1 TABLET BY MOUTH 3 TIMES DAILY 90 tablet 1  . cyclobenzaprine (FLEXERIL) 10 MG tablet TAKE 1 TABLET (10 MG TOTAL) BY MOUTH AT BEDTIME AS NEEDED FOR MUSCLE SPASMS. (Patient not taking: Reported on 10/09/2014) 20 tablet 0  . ALPRAZolam (XANAX) 0.25 MG tablet TAKE 1 TABLET BY MOUTH 2 TIMES DAILY AS NEEDED (Patient not taking: Reported on 10/09/2014) 30 tablet 1  . escitalopram (LEXAPRO) 20 MG tablet Take 1 tablet (20 mg total) by mouth daily. 30 tablet 3  . zolpidem (AMBIEN) 10 MG tablet Take 1 tablet (10 mg total) by mouth  at bedtime as needed. 30 tablet 1   No facility-administered medications prior to visit.    Allergies  Allergen Reactions  . Codeine Nausea Only    Syncope   . Melatonin Anxiety    Review of Systems  Constitutional: Negative for fever and malaise/fatigue.  HENT: Negative for congestion.   Eyes: Negative for discharge.  Respiratory: Negative for shortness of breath.   Cardiovascular: Negative for chest pain, palpitations and leg swelling.  Gastrointestinal: Negative for nausea and abdominal pain.  Genitourinary: Negative for dysuria.  Musculoskeletal: Negative for falls.  Skin: Negative for rash.  Neurological: Negative for loss of consciousness and headaches.  Endo/Heme/Allergies: Negative for environmental allergies.  Psychiatric/Behavioral: Negative for depression.  The patient is not nervous/anxious.        Objective:    Physical Exam  Constitutional: She is oriented to person, place, and time. She appears well-developed and well-nourished. No distress.  HENT:  Head: Normocephalic and atraumatic.  Eyes: Conjunctivae are normal.  Neck: Neck supple. No thyromegaly present.  Cardiovascular: Normal rate, regular rhythm and normal heart sounds.   No murmur heard. Pulmonary/Chest: Effort normal and breath sounds normal. No respiratory distress.  Abdominal: Soft. Bowel sounds are normal. She exhibits no distension and no mass. There is no tenderness.  Musculoskeletal: She exhibits no edema.  Lymphadenopathy:    She has no cervical adenopathy.  Neurological: She is alert and oriented to person, place, and time.  Skin: Skin is warm and dry.  Psychiatric: She has a normal mood and affect. Her behavior is normal.    BP 129/69 mmHg  Pulse 72  Temp(Src) 98.2 F (36.8 C) (Oral)  Ht 5\' 4"  (1.626 m)  Wt 162 lb 4 oz (73.596 kg)  BMI 27.84 kg/m2  SpO2 100% Wt Readings from Last 3 Encounters:  10/09/14 162 lb 4 oz (73.596 kg)  07/31/14 159 lb 4 oz (72.235 kg)  02/09/14 165 lb (74.844 kg)     Lab Results  Component Value Date   WBC 7.3 07/31/2014   HGB 13.2 07/31/2014   HCT 40.4 07/31/2014   PLT 332.0 07/31/2014   GLUCOSE 82 07/31/2014   CHOL 185 07/31/2014   TRIG 290.0* 07/31/2014   HDL 54.30 07/31/2014   LDLDIRECT 98.0 07/31/2014   LDLCALC 89 09/04/2012   ALT 12 07/31/2014   AST 17 07/31/2014   NA 135 07/31/2014   K 4.0 07/31/2014   CL 101 07/31/2014   CREATININE 0.84 07/31/2014   BUN 13 07/31/2014   CO2 29 07/31/2014   TSH 1.06 07/31/2014    Lab Results  Component Value Date   TSH 1.06 07/31/2014   Lab Results  Component Value Date   WBC 7.3 07/31/2014   HGB 13.2 07/31/2014   HCT 40.4 07/31/2014   MCV 81.6 07/31/2014   PLT 332.0 07/31/2014   Lab Results  Component Value Date   NA 135 07/31/2014   K 4.0 07/31/2014    CO2 29 07/31/2014   GLUCOSE 82 07/31/2014   BUN 13 07/31/2014   CREATININE 0.84 07/31/2014   BILITOT 0.4 07/31/2014   ALKPHOS 58 07/31/2014   AST 17 07/31/2014   ALT 12 07/31/2014   PROT 7.3 07/31/2014   ALBUMIN 4.0 07/31/2014   CALCIUM 9.4 07/31/2014   GFR 80.73 07/31/2014   Lab Results  Component Value Date   CHOL 185 07/31/2014   Lab Results  Component Value Date   HDL 54.30 07/31/2014   Lab Results  Component Value Date   LDLCALC  89 09/04/2012   Lab Results  Component Value Date   TRIG 290.0* 07/31/2014   Lab Results  Component Value Date   CHOLHDL 3 07/31/2014   No results found for: HGBA1C     Assessment & Plan:   Problem List Items Addressed This Visit    RLS (restless legs syndrome)    Requip is helping her sleep, will continue. Taking Magnesium orally and using topical magnesium. Will check magnesium level today      Mixed hyperlipidemia    Encouraged heart healthy diet, increase exercise, avoid trans fats, consider a krill oil cap daily. Patient aware TG were up at last blood draw, encouraged to minimize carbs and fatty foods, increase exercise.      Depression with anxiety    Stopped Lexapro did not feel like it was helping. Is taking some herbal meds called Stress Care and Stress J and feels it is helping, is about to have her father soon, will allow a refill on Xanax. Check CMP today due to herbal med use.        Other Visit Diagnoses    High risk medication use    -  Primary    Relevant Orders    Magnesium    Hepatic function panel       I have discontinued Ms. Hankinson's escitalopram and zolpidem. I am also having her maintain her ibuprofen, OVER THE COUNTER MEDICATION, ethynodiol-ethinyl estradiol, cyclobenzaprine, Multiple Vitamins-Minerals (MULTIVITAMIN PO), rOPINIRole, and ALPRAZolam.  Meds ordered this encounter  Medications  . ALPRAZolam (XANAX) 0.25 MG tablet    Sig: TAKE 1 TABLET BY MOUTH 2 TIMES DAILY AS NEEDED    Dispense:  30  tablet    Refill:  1     BLYTH, STACEY, MD

## 2014-10-09 NOTE — Progress Notes (Deleted)
Patient ID: Madeline Murphy, female   DOB: March 22, 1975, 39 y.o.   MRN: [id number]   Subjective:    Patient ID: Madeline Murphy, female    DOB: 12-20-1975, 39 y.o.   MRN: [id number]  Chief Complaint  Patient presents with  . Follow-up    HPI Patient is in today for ***  Past Medical History  Diagnosis Date  . Bronchitis 01/30/2011  . Vaginitis 06/02/2011  . Sinusitis 02/19/2012  . Strep pharyngitis 02/19/2012  . Overweight(278.02) 06/02/2012  . Other and unspecified hyperlipidemia 09/12/2012  . Insomnia 09/12/2012  . RLS (restless legs syndrome) 09/12/2012  . Depression with anxiety 09/12/2012    With some PMS components  . Right arm pain 08/03/2014    Past Surgical History  Procedure Laterality Date  . Chronc compartmenal syndrome      S/P ortho surgery x3    Family History  Problem Relation Age of Onset  . Coronary artery disease    . Hypertension    . Hyperlipidemia Mother   . Heart disease Mother     palpitation  . Hyperlipidemia Father   . Hypertension Father   . Heart disease Father     s/p bypass and stent, passed from MI 06/24/14  . Otitis media Daughter     recurrent strep  . Cancer Maternal Aunt     breast  . Cancer Maternal Grandfather     possibly stomach  . Aneurysm Paternal Grandmother     likely brain  . Cancer Paternal Grandmother     breast  . Otitis media Daughter     Social History   Social History  . Marital Status: Married    Spouse Name: N/A  . Number of Children: N/A  . Years of Education: N/A   Occupational History  . Not on file.   Social History Main Topics  . Smoking status: Never Smoker   . Smokeless tobacco: Never Used  . Alcohol Use: Yes  . Drug Use: No  . Sexual Activity: Yes    Birth Control/ Protection: Pill   Other Topics Concern  . Not on file   Social History Narrative    Outpatient Prescriptions Prior to Visit  Medication Sig Dispense Refill  . ethynodiol-ethinyl estradiol (KELNOR,ZOVIA) 1-35 MG-MCG tablet Take 1 tablet  by mouth daily.    Marland Kitchen ibuprofen (ADVIL,MOTRIN) 200 MG tablet Take 400 mg by mouth once as needed. For pain     . Multiple Vitamins-Minerals (MULTIVITAMIN PO) Take by mouth daily.    Marland Kitchen OVER THE COUNTER MEDICATION Take 2 capsules by mouth daily. PROBIOTIC 11    . rOPINIRole (REQUIP) 1 MG tablet TAKE 1 TABLET BY MOUTH 3 TIMES DAILY 90 tablet 1  . ALPRAZolam (XANAX) 0.25 MG tablet TAKE 1 TABLET BY MOUTH 2 TIMES DAILY AS NEEDED (Patient not taking: Reported on 10/09/2014) 30 tablet 1  . cyclobenzaprine (FLEXERIL) 10 MG tablet TAKE 1 TABLET (10 MG TOTAL) BY MOUTH AT BEDTIME AS NEEDED FOR MUSCLE SPASMS. (Patient not taking: Reported on 10/09/2014) 20 tablet 0  . escitalopram (LEXAPRO) 20 MG tablet Take 1 tablet (20 mg total) by mouth daily. 30 tablet 3  . zolpidem (AMBIEN) 10 MG tablet Take 1 tablet (10 mg total) by mouth at bedtime as needed. 30 tablet 1   No facility-administered medications prior to visit.    Allergies  Allergen Reactions  . Codeine Nausea Only    Syncope   . Melatonin Anxiety    Review of Systems  Constitutional: Negative for  fever and malaise/fatigue.  HENT: Negative for congestion.   Eyes: Negative for discharge.  Respiratory: Negative for shortness of breath.   Cardiovascular: Negative for chest pain, palpitations and leg swelling.  Gastrointestinal: Negative for nausea and abdominal pain.  Genitourinary: Negative for dysuria.  Musculoskeletal: Negative for falls.  Skin: Negative for rash.  Neurological: Negative for loss of consciousness and headaches.  Endo/Heme/Allergies: Negative for environmental allergies.  Psychiatric/Behavioral: Negative for depression. The patient is not nervous/anxious.        Objective:    Physical Exam  BP 129/69 mmHg  Pulse 72  Temp(Src) 98.2 F (36.8 C) (Oral)  Ht 5\' 4"  (1.626 m)  Wt 162 lb 4 oz (73.596 kg)  BMI 27.84 kg/m2  SpO2 100% Wt Readings from Last 3 Encounters:  10/09/14 162 lb 4 oz (73.596 kg)  07/31/14 159 lb  4 oz (72.235 kg)  02/09/14 165 lb (74.844 kg)     Lab Results  Component Value Date   WBC 7.3 07/31/2014   HGB 13.2 07/31/2014   HCT 40.4 07/31/2014   PLT 332.0 07/31/2014   GLUCOSE 82 07/31/2014   CHOL 185 07/31/2014   TRIG 290.0* 07/31/2014   HDL 54.30 07/31/2014   LDLDIRECT 98.0 07/31/2014   LDLCALC 89 09/04/2012   ALT 12 07/31/2014   AST 17 07/31/2014   NA 135 07/31/2014   K 4.0 07/31/2014   CL 101 07/31/2014   CREATININE 0.84 07/31/2014   BUN 13 07/31/2014   CO2 29 07/31/2014   TSH 1.06 07/31/2014    Lab Results  Component Value Date   TSH 1.06 07/31/2014   Lab Results  Component Value Date   WBC 7.3 07/31/2014   HGB 13.2 07/31/2014   HCT 40.4 07/31/2014   MCV 81.6 07/31/2014   PLT 332.0 07/31/2014   Lab Results  Component Value Date   NA 135 07/31/2014   K 4.0 07/31/2014   CO2 29 07/31/2014   GLUCOSE 82 07/31/2014   BUN 13 07/31/2014   CREATININE 0.84 07/31/2014   BILITOT 0.4 07/31/2014   ALKPHOS 58 07/31/2014   AST 17 07/31/2014   ALT 12 07/31/2014   PROT 7.3 07/31/2014   ALBUMIN 4.0 07/31/2014   CALCIUM 9.4 07/31/2014   GFR 80.73 07/31/2014   Lab Results  Component Value Date   CHOL 185 07/31/2014   Lab Results  Component Value Date   HDL 54.30 07/31/2014   Lab Results  Component Value Date   LDLCALC 89 09/04/2012   Lab Results  Component Value Date   TRIG 290.0* 07/31/2014   Lab Results  Component Value Date   CHOLHDL 3 07/31/2014   No results found for: HGBA1C     Assessment & Plan:   Problem List Items Addressed This Visit    None      I have discontinued Ms. Ringgenberg's escitalopram and zolpidem. I am also having her maintain her ibuprofen, OVER THE COUNTER MEDICATION, ethynodiol-ethinyl estradiol, cyclobenzaprine, Multiple Vitamins-Minerals (MULTIVITAMIN PO), ALPRAZolam, and rOPINIRole.  No orders of the defined types were placed in this encounter.     Elizabeth Sauer, LPN

## 2014-10-09 NOTE — Patient Instructions (Signed)
Restless Legs Syndrome Restless legs syndrome is a movement disorder. It may also be called a sensorimotor disorder.  CAUSES  No one knows what specifically causes restless legs syndrome, but it tends to run in families. It is also more common in people with low iron, in pregnancy, in people who need dialysis, and those with nerve damage (neuropathy).Some medications may make restless legs syndrome worse.Those medications include drugs to treat high blood pressure, some heart conditions, nausea, colds, allergies, and depression. SYMPTOMS Symptoms include uncomfortable sensations in the legs. These leg sensations are worse during periods of inactivity or rest. They are also worse while sitting or lying down. Individuals that have the disorder describe sensations in the legs that feel like:  Pulling.  Drawing.  Crawling.  Worming.  Boring.  Tingling.  Pins and needles.  Prickling.  Pain. The sensations are usually accompanied by an overwhelming urge to move the legs. Sudden muscle jerks may also occur. Movement provides temporary relief from the discomfort. In rare cases, the arms may also be affected. Symptoms may interfere with going to sleep (sleep onset insomnia). Restless legs syndrome may also be related to periodic limb movement disorder (PLMD). PLMD is another more common motor disorder. It also causes interrupted sleep. The symptoms from PLMD usually occur most often when you are awake. TREATMENT  Treatment for restless legs syndrome is symptomatic. This means that the symptoms are treated.   Massage and cold compresses may provide temporary relief.  Walk, stretch, or take a cold or hot bath.  Get regular exercise and a good night's sleep.  Avoid caffeine, alcohol, nicotine, and medications that can make it worse.  Do activities that provide mental stimulation like discussions, needlework, and video games. These may be helpful if you are not able to walk or stretch. Some  medications are effective in relieving the symptoms. However, many of these medications have side effects. Ask your caregiver about medications that may help your symptoms. Correcting iron deficiency may improve symptoms for some patients. Document Released: 01/20/2002 Document Revised: 06/16/2013 Document Reviewed: 04/28/2010 Exit[employer] Patient Information 2015 [employer]. This information is not intended to replace advice given to you by your health care provider. Make sure you discuss any questions you have with your health care provider.  

## 2014-10-09 NOTE — Assessment & Plan Note (Signed)
Stopped Lexapro did not feel like it was helping. Is taking some herbal meds called Stress Care and Stress J and feels it is helping, is about to have her father soon, will allow a refill on Xanax. Check CMP today due to herbal med use.

## 2014-10-09 NOTE — Progress Notes (Signed)
Pre visit review using our clinic review tool, if applicable. No additional management support is needed unless otherwise documented below in the visit note. 

## 2014-10-09 NOTE — Assessment & Plan Note (Addendum)
Requip is helping her sleep, will continue. Taking Magnesium orally and using topical magnesium. Will check magnesium level today

## 2014-10-09 NOTE — Assessment & Plan Note (Signed)
Encouraged heart healthy diet, increase exercise, avoid trans fats, consider a krill oil cap daily. Patient aware TG were up at last blood draw, encouraged to minimize carbs and fatty foods, increase exercise.

## 2014-10-21 ENCOUNTER — Other Ambulatory Visit: Payer: Self-pay | Admitting: Obstetrics and Gynecology

## 2014-10-22 LAB — CYTOLOGY - PAP

## 2014-12-25 ENCOUNTER — Encounter: Payer: Self-pay | Admitting: Family Medicine

## 2014-12-25 ENCOUNTER — Ambulatory Visit (HOSPITAL_BASED_OUTPATIENT_CLINIC_OR_DEPARTMENT_OTHER)
Admission: RE | Admit: 2014-12-25 | Discharge: 2014-12-25 | Disposition: A | Discharge status: Home / Self Care | Payer: BLUE CROSS/BLUE SHIELD | Source: Ambulatory Visit | Attending: Family Medicine | Admitting: Family Medicine

## 2014-12-25 ENCOUNTER — Ambulatory Visit (INDEPENDENT_AMBULATORY_CARE_PROVIDER_SITE_OTHER): Payer: BLUE CROSS/BLUE SHIELD | Admitting: Family Medicine

## 2014-12-25 VITALS — BP 124/85 | HR 74 | Temp 97.7°F | Ht 64.0 in | Wt 162.2 lb

## 2014-12-25 DIAGNOSIS — G2581 Restless legs syndrome: Secondary | ICD-10-CM

## 2014-12-25 DIAGNOSIS — M25511 Pain in right shoulder: Secondary | ICD-10-CM

## 2014-12-25 DIAGNOSIS — Z23 Encounter for immunization: Secondary | ICD-10-CM | POA: Diagnosis not present

## 2014-12-25 DIAGNOSIS — E782 Mixed hyperlipidemia: Secondary | ICD-10-CM

## 2014-12-25 DIAGNOSIS — E663 Overweight: Secondary | ICD-10-CM | POA: Diagnosis not present

## 2014-12-25 NOTE — Patient Instructions (Signed)

## 2015-01-03 ENCOUNTER — Encounter: Payer: Self-pay | Admitting: Family Medicine

## 2015-01-03 NOTE — Assessment & Plan Note (Signed)
requip has been helpful, may continue

## 2015-01-03 NOTE — Progress Notes (Signed)
Subjective:    Patient ID: Madeline Murphy, female    DOB: 10/03/1975, 39 y.o.   MRN: [id number]  Chief Complaint  Patient presents with  . Follow-up    HPI Patient is in today for follo.sbhw up. Has been struggling off and on for a while with right shoulder pain it was getting tremendously better until she fell last week and at this point she is having decreased range of motion and pain. No radicular symptoms into hand. No other acute complaints today no recent illness. Agrees to flu shot. Denies CP/palp/SOB/HA/congestion/fevers/GI or GU c/o. Taking meds as prescribed  Past Medical History  Diagnosis Date  . Bronchitis 01/30/2011  . Vaginitis 06/02/2011  . Sinusitis 02/19/2012  . Strep pharyngitis 02/19/2012  . Overweight(278.02) 06/02/2012  . Other and unspecified hyperlipidemia 09/12/2012  . Insomnia 09/12/2012  . RLS (restless legs syndrome) 09/12/2012  . Depression with anxiety 09/12/2012    With some PMS components  . Right arm pain 08/03/2014  . Right shoulder pain 08/03/2014    Past Surgical History  Procedure Laterality Date  . Chronc compartmenal syndrome      S/P ortho surgery x3    Family History  Problem Relation Age of Onset  . Coronary artery disease    . Hypertension    . Hyperlipidemia Mother   . Heart disease Mother     palpitation  . Hyperlipidemia Father   . Hypertension Father   . Heart disease Father     s/p bypass and stent, passed from MI 06/24/14  . Otitis media Daughter     recurrent strep  . Cancer Maternal Aunt     breast  . Cancer Maternal Grandfather     possibly stomach  . Aneurysm Paternal Grandmother     likely brain  . Cancer Paternal Grandmother     breast  . Otitis media Daughter     Social History   Social History  . Marital Status: Married    Spouse Name: N/A  . Number of Children: N/A  . Years of Education: N/A   Occupational History  . Not on file.   Social History Main Topics  . Smoking status: Never Smoker   . Smokeless  tobacco: Never Used  . Alcohol Use: Yes  . Drug Use: No  . Sexual Activity: Yes    Birth Control/ Protection: Pill   Other Topics Concern  . Not on file   Social History Narrative    Outpatient Prescriptions Prior to Visit  Medication Sig Dispense Refill  . ALPRAZolam (XANAX) 0.25 MG tablet TAKE 1 TABLET BY MOUTH 2 TIMES DAILY AS NEEDED 30 tablet 1  . ethynodiol-ethinyl estradiol (KELNOR,ZOVIA) 1-35 MG-MCG tablet Take 1 tablet by mouth daily.    Marland Kitchen ibuprofen (ADVIL,MOTRIN) 200 MG tablet Take 400 mg by mouth once as needed. For pain     . Multiple Vitamins-Minerals (MULTIVITAMIN PO) Take by mouth daily.    Marland Kitchen OVER THE COUNTER MEDICATION Take 2 capsules by mouth daily. PROBIOTIC 11    . rOPINIRole (REQUIP) 1 MG tablet TAKE 1 TABLET BY MOUTH 3 TIMES DAILY (Patient taking differently: TAKE 2 BY MOUTH AT BEDTIME) 90 tablet 1  . cyclobenzaprine (FLEXERIL) 10 MG tablet TAKE 1 TABLET (10 MG TOTAL) BY MOUTH AT BEDTIME AS NEEDED FOR MUSCLE SPASMS. (Patient not taking: Reported on 12/25/2014) 20 tablet 0   No facility-administered medications prior to visit.    Allergies  Allergen Reactions  . Codeine Nausea Only    Syncope   .  Melatonin Anxiety    Review of Systems  Constitutional: Negative for fever and malaise/fatigue.  HENT: Negative for congestion.   Eyes: Negative for discharge.  Respiratory: Negative for shortness of breath.   Cardiovascular: Negative for chest pain, palpitations and leg swelling.  Gastrointestinal: Negative for nausea and abdominal pain.  Genitourinary: Negative for dysuria.  Musculoskeletal: Positive for joint pain. Negative for falls.  Skin: Negative for rash.  Neurological: Negative for loss of consciousness and headaches.  Endo/Heme/Allergies: Negative for environmental allergies.  Psychiatric/Behavioral: Negative for depression. The patient is not nervous/anxious.        Objective:    Physical Exam  Constitutional: She is oriented to person,  place, and time. She appears well-developed and well-nourished. No distress.  HENT:  Head: Normocephalic and atraumatic.  Nose: Nose normal.  Eyes: Right eye exhibits no discharge. Left eye exhibits no discharge.  Neck: Normal range of motion. Neck supple.  Cardiovascular: Normal rate and regular rhythm.   No murmur heard. Pulmonary/Chest: Effort normal and breath sounds normal.  Abdominal: Soft. Bowel sounds are normal. There is no tenderness.  Musculoskeletal: She exhibits no edema.  Neurological: She is alert and oriented to person, place, and time.  Skin: Skin is warm and dry.  Psychiatric: She has a normal mood and affect.  Nursing note and vitals reviewed.   BP 124/85 mmHg  Pulse 74  Temp(Src) 97.7 F (36.5 C) (Oral)  Ht 5\' 4"  (1.626 m)  Wt 162 lb 4 oz (73.596 kg)  BMI 27.84 kg/m2  SpO2 100% Wt Readings from Last 3 Encounters:  12/25/14 162 lb 4 oz (73.596 kg)  10/09/14 162 lb 4 oz (73.596 kg)  07/31/14 159 lb 4 oz (72.235 kg)     Lab Results  Component Value Date   WBC 7.3 07/31/2014   HGB 13.2 07/31/2014   HCT 40.4 07/31/2014   PLT 332.0 07/31/2014   GLUCOSE 82 07/31/2014   CHOL 185 07/31/2014   TRIG 290.0* 07/31/2014   HDL 54.30 07/31/2014   LDLDIRECT 98.0 07/31/2014   LDLCALC 89 09/04/2012   ALT 10 10/09/2014   AST 14 10/09/2014   NA 135 07/31/2014   K 4.0 07/31/2014   CL 101 07/31/2014   CREATININE 0.84 07/31/2014   BUN 13 07/31/2014   CO2 29 07/31/2014   TSH 1.06 07/31/2014    Lab Results  Component Value Date   TSH 1.06 07/31/2014   Lab Results  Component Value Date   WBC 7.3 07/31/2014   HGB 13.2 07/31/2014   HCT 40.4 07/31/2014   MCV 81.6 07/31/2014   PLT 332.0 07/31/2014   Lab Results  Component Value Date   NA 135 07/31/2014   K 4.0 07/31/2014   CO2 29 07/31/2014   GLUCOSE 82 07/31/2014   BUN 13 07/31/2014   CREATININE 0.84 07/31/2014   BILITOT 0.4 10/09/2014   ALKPHOS 51 10/09/2014   AST 14 10/09/2014   ALT 10 10/09/2014     PROT 6.7 10/09/2014   ALBUMIN 3.9 10/09/2014   CALCIUM 9.4 07/31/2014   GFR 80.73 07/31/2014   Lab Results  Component Value Date   CHOL 185 07/31/2014   Lab Results  Component Value Date   HDL 54.30 07/31/2014   Lab Results  Component Value Date   LDLCALC 89 09/04/2012   Lab Results  Component Value Date   TRIG 290.0* 07/31/2014   Lab Results  Component Value Date   CHOLHDL 3 07/31/2014   No results found for: HGBA1C  Assessment & Plan:   Problem List Items Addressed This Visit    Mixed hyperlipidemia    Encouraged heart healthy diet, increase exercise, avoid trans fats, consider a krill oil cap daily      Overweight    Encouraged DASH diet, decrease po intake and increase exercise as tolerated. Needs 7-8 hours of sleep nightly. Avoid trans fats, eat small, frequent meals every 4-5 hours with lean proteins, complex carbs and healthy fats. Minimize simple carbs, GMO foods.      Right shoulder pain    Was improving then reinjured it last week. Xray unremarkable, referred to sports med for further care. Encouraged moist heat and gentle stretching as tolerated. May try NSAIDs and prescription meds as directed and report if symptoms worsen or seek immediate care. Given Flexeril to use sparingly qhs for severe pain      Relevant Orders   DG Shoulder Right (Completed)   Ambulatory referral to Sports Medicine   RLS (restless legs syndrome)    requip has been helpful, may continue       Other Visit Diagnoses    Encounter for immunization    -  Primary       I am having Ms. Taranto maintain her ibuprofen, OVER THE COUNTER MEDICATION, ethynodiol-ethinyl estradiol, cyclobenzaprine, Multiple Vitamins-Minerals (MULTIVITAMIN PO), rOPINIRole, and ALPRAZolam.  No orders of the defined types were placed in this encounter.     Penni Homans, MD

## 2015-01-03 NOTE — Assessment & Plan Note (Addendum)
Was improving then reinjured it last week. Xray unremarkable, referred to sports med for further care. Encouraged moist heat and gentle stretching as tolerated. May try NSAIDs and prescription meds as directed and report if symptoms worsen or seek immediate care. Given Flexeril to use sparingly qhs for severe pain

## 2015-01-03 NOTE — Assessment & Plan Note (Signed)
Encouraged DASH diet, decrease po intake and increase exercise as tolerated. Needs 7-8 hours of sleep nightly. Avoid trans fats, eat small, frequent meals every 4-5 hours with lean proteins, complex carbs and healthy fats. Minimize simple carbs, GMO foods. 

## 2015-01-03 NOTE — Assessment & Plan Note (Signed)
Encouraged heart healthy diet, increase exercise, avoid trans fats, consider a krill oil cap daily 

## 2015-01-12 ENCOUNTER — Encounter: Payer: Self-pay | Admitting: Family Medicine

## 2015-01-12 ENCOUNTER — Ambulatory Visit (INDEPENDENT_AMBULATORY_CARE_PROVIDER_SITE_OTHER): Payer: BLUE CROSS/BLUE SHIELD | Admitting: Family Medicine

## 2015-01-12 ENCOUNTER — Other Ambulatory Visit (INDEPENDENT_AMBULATORY_CARE_PROVIDER_SITE_OTHER): Payer: BLUE CROSS/BLUE SHIELD

## 2015-01-12 VITALS — BP 122/82 | HR 79 | Ht 64.0 in | Wt 163.0 lb

## 2015-01-12 DIAGNOSIS — M25511 Pain in right shoulder: Secondary | ICD-10-CM

## 2015-01-12 DIAGNOSIS — M75100 Unspecified rotator cuff tear or rupture of unspecified shoulder, not specified as traumatic: Secondary | ICD-10-CM | POA: Insufficient documentation

## 2015-01-12 DIAGNOSIS — M75101 Unspecified rotator cuff tear or rupture of right shoulder, not specified as traumatic: Secondary | ICD-10-CM

## 2015-01-12 NOTE — Patient Instructions (Signed)
Good to see you.  Ice 20 minutes 2 times daily. Usually after activity and before bed. Exercises 3 times a week.  meloxicam daily for 10 days then as needed Vitamin D 2000 IU daily Turmeric 500mg  twice daily Vitamin C 500mg  daily See me again in 3 weeks.

## 2015-01-12 NOTE — Assessment & Plan Note (Signed)
Patient given injection today and tolerated the procedure very well. I do think the patient is going to have another 6 weeks at least of rehabilitation. Patient declined formal physical therapy. Work with Product/process development scientist today to learn home exercises in greater detail. We discussed icing regimen. Patient will also try over-the-counter natural supplementations. Patient is not a candidate for nitroglycerin patches secondary to her history of migraines. We'll see patient back again in 3 weeks to be sure she is responding. Otherwise would encourage her to do formal physical therapy.

## 2015-01-12 NOTE — Progress Notes (Signed)
Pre visit review using our clinic review tool, if applicable. No additional management support is needed unless otherwise documented below in the visit note. 

## 2015-01-12 NOTE — Progress Notes (Signed)
[name] [address] [place], [place] [address] Phone: [phone] Subjective:    I'm seeing this patient by the request  of:  Penni Homans, MD   CC: Shoulder pain right-sided  [id number] Madeline Murphy is a 39 y.o. female coming in with complaint of right-sided shoulder pain. Has had this pain for 2 months. Patient does CrossFit regularly. Patient has been very active but was doing a snatch and felt severe pain on most immediately. States that there is 2 days where she was unable to pick up her arm. Slowly started increasing range of motion again. States that there is some mild radicular symptoms going on the posterior aspect the arm that seemed to reassess resolved. Continues to have pain even at night that wakes her up. Rates the severity of 6 out of 10. Has not been lifting at all loss for the last 3 weeks. Feels that there may be some mild weakness. Stopping her from working out but can do regular activities.  Past Medical History  Diagnosis Date  . Bronchitis 01/30/2011  . Vaginitis 06/02/2011  . Sinusitis 02/19/2012  . Strep pharyngitis 02/19/2012  . Overweight(278.02) 06/02/2012  . Other and unspecified hyperlipidemia 09/12/2012  . Insomnia 09/12/2012  . RLS (restless legs syndrome) 09/12/2012  . Depression with anxiety 09/12/2012    With some PMS components  . Right arm pain 08/03/2014  . Right shoulder pain 08/03/2014   Past Surgical History  Procedure Laterality Date  . Chronc compartmenal syndrome      S/P ortho surgery x3   Social History  Substance Use Topics  . Smoking status: Never Smoker   . Smokeless tobacco: Never Used  . Alcohol Use: Yes   Allergies  Allergen Reactions  . Codeine Nausea Only    Syncope   . Melatonin Anxiety   Family History  Problem Relation Age of Onset  . Coronary artery disease    . Hypertension    . Hyperlipidemia Mother   . Heart disease Mother     palpitation  . Hyperlipidemia Father   .  Hypertension Father   . Heart disease Father     s/p bypass and stent, passed from MI 06/24/14  . Otitis media Daughter     recurrent strep  . Cancer Maternal Aunt     breast  . Cancer Maternal Grandfather     possibly stomach  . Aneurysm Paternal Grandmother     likely brain  . Cancer Paternal Grandmother     breast  . Otitis media Daughter         Past medical history, social, surgical and family history all reviewed in electronic medical record.   Patient did have x-rays. X-rays were reviewed by me. X-rays taken 12/25/2014 show no significant arthritis or any bony abnormality.  Review of Systems: No headache, visual changes, nausea, vomiting, diarrhea, constipation, dizziness, abdominal pain, skin rash, fevers, chills, night sweats, weight loss, swollen lymph nodes, body aches, joint swelling, muscle aches, chest pain, shortness of breath, mood changes.   Objective Blood pressure 122/82, pulse 79, height 5\' 4"  (1.626 m), weight 163 lb (73.936 kg), SpO2 98 %.  General: No apparent distress alert and oriented x3 mood and affect normal, dressed appropriately.  HEENT: Pupils equal, extraocular movements intact  Respiratory: Patient's speak in full sentences and does not appear short of breath  Cardiovascular: No lower extremity edema, non tender, no erythema  Skin: Warm dry intact with no signs of infection or rash  on extremities or on axial skeleton.  Abdomen: Soft nontender  Neuro: Cranial nerves II through XII are intact, neurovascularly intact in all extremities with 2+ DTRs and 2+ pulses.  Lymph: No lymphadenopathy of posterior or anterior cervical chain or axillae bilaterally.  Gait normal with good balance and coordination.  MSK:  Non tender with full range of motion and good stability and symmetric strength and tone of , elbows, wrist, hip, knee and ankles bilaterally.  Shoulder: Right Inspection reveals no abnormalities, atrophy or asymmetry. Palpation is normal with no  tenderness over AC joint or bicipital groove. ROM is full in all planes passively. Rotator cuff strength 4 out of 5 compared to 5 out of 5 on the contralateral side signs of impingement with positive Neer and Hawkin's tests, but negative empty can sign. Speeds and Yergason's tests normal. Mild positive O'Brien's. Normal scapular function observed. No painful arc and no drop arm sign. No apprehension sign  MSK US performed of: Right This study was ordered, performed, and interpreted by Charlann Boxer D.O.  Shoulder:   Supraspinatus:  Patient has what appears to be a rotator cuff tear. Seems to have significant scar tissue at this time. Still has some articular side retraction. Approximately 0.8 cm. Only partial thickness. Infraspinatus:  Appears normal on long and transverse views. Significant increase in Doppler flow Subscapularis:  Appears normal on long and transverse views. Positive bursa Teres Minor:  Appears normal on long and transverse views. AC joint:  Capsule undistended, no geyser sign. Glenohumeral Joint:  Appears normal without effusion. Glenoid Labrum:  Intact without visualized tears. Biceps Tendon:  Appears normal on long and transverse views, no fraying of tendon, tendon located in intertubercular groove, no subluxation with shoulder internal or external rotation.   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  Verbal informed consent obtained.  Time-out conducted.  Noted no overlying erythema, induration, or other signs of local infection.  Skin prepped in a sterile fashion.  Local anesthesia: Topical Ethyl chloride.  With sterile technique and under  real time ultrasound guidance:  Joint visualized.  23g 1  inch needle inserted posterior approach. Pictures taken for needle placement. Patient did have injection of 2 cc of 1% lidocaine, 2 cc of 0.5% Marcaine, and 1.0 cc of Kenalog 40 mg/dL. Completed without difficulty  Pain immediately resolved suggesting accurate placement of the medication.  Advised to call if fevers/chills, erythema, induration, drainage, or persistent bleeding.  Images permanently stored and available for review in the ultrasound unit.  Impression: Technically successful ultrasound guided injection.  Procedure note [id number]; 15 minutes spent for Therapeutic exercises as stated in above notes.  This included exercises focusing on stretching, strengthening, with significant focus on eccentric aspects. Basic scapular stabilization to include adduction and depression of scapula Scaption, focusing on proper movement and good control Internal and External rotation utilizing a theraband, with elbow tucked at side entire time Rows with theraband  Proper technique shown and discussed handout in great detail with ATC.  All questions were discussed and answered.     Impression and Recommendations:     This case required medical decision making of moderate complexity.

## 2015-01-22 ENCOUNTER — Ambulatory Visit (INDEPENDENT_AMBULATORY_CARE_PROVIDER_SITE_OTHER): Payer: BLUE CROSS/BLUE SHIELD | Admitting: Family Medicine

## 2015-01-22 ENCOUNTER — Encounter: Payer: Self-pay | Admitting: Family Medicine

## 2015-01-22 VITALS — BP 128/86 | HR 78 | Temp 97.9°F | Resp 16 | Ht 64.0 in | Wt 163.2 lb

## 2015-01-22 DIAGNOSIS — J069 Acute upper respiratory infection, unspecified: Secondary | ICD-10-CM

## 2015-01-22 DIAGNOSIS — R05 Cough: Secondary | ICD-10-CM | POA: Diagnosis not present

## 2015-01-22 DIAGNOSIS — R059 Cough, unspecified: Secondary | ICD-10-CM

## 2015-01-22 MED ORDER — HYDROCODONE-HOMATROPINE 5-1.5 MG/5ML PO SYRP: ORAL_SOLUTION | ORAL | Status: DC

## 2015-01-22 MED ORDER — AZITHROMYCIN 250 MG PO TABS: ORAL_TABLET | ORAL | Status: DC

## 2015-01-22 NOTE — Progress Notes (Signed)
OFFICE VISIT  01/22/2015   CC:  Chief Complaint  Patient presents with  . URI    x 5 days     HPI:    Patient is a 39 y.o. Caucasian female who presents for respiratory complaints. Onset 5 d/a with ST, nasal congestion w/ PND and tickle in back of throat, lost voice, lots of coughing.  No fever.  Vomited a couple nights--not sure if from her OTC cold med interacting w/her requip.   This morning she vomited after a coughing "fit" and it was a lot of phlegm.  No diarrhea. Constant HA, and some body aching in first 1-2 days but this has gone away and now she just aches in chest with coughing.  No wheezing, no chest tightness, no SOB. She is not a smoker.  Past Medical History  Diagnosis Date  . Bronchitis 01/30/2011  . Vaginitis 06/02/2011  . Sinusitis 02/19/2012  . Strep pharyngitis 02/19/2012  . Overweight(278.02) 06/02/2012  . Other and unspecified hyperlipidemia 09/12/2012  . Insomnia 09/12/2012  . RLS (restless legs syndrome) 09/12/2012  . Depression with anxiety 09/12/2012    With some PMS components  . Right arm pain 08/03/2014  . Right shoulder pain 08/03/2014    Past Surgical History  Procedure Laterality Date  . Chronc compartmenal syndrome      S/P ortho surgery x3    Outpatient Prescriptions Prior to Visit  Medication Sig Dispense Refill  . ALPRAZolam (XANAX) 0.25 MG tablet TAKE 1 TABLET BY MOUTH 2 TIMES DAILY AS NEEDED 30 tablet 1  . cyclobenzaprine (FLEXERIL) 10 MG tablet TAKE 1 TABLET (10 MG TOTAL) BY MOUTH AT BEDTIME AS NEEDED FOR MUSCLE SPASMS. 20 tablet 0  . ethynodiol-ethinyl estradiol (KELNOR,ZOVIA) 1-35 MG-MCG tablet Take 1 tablet by mouth daily.    Marland Kitchen ibuprofen (ADVIL,MOTRIN) 200 MG tablet Take 400 mg by mouth once as needed. For pain     . Multiple Vitamins-Minerals (MULTIVITAMIN PO) Take by mouth daily.    Marland Kitchen OVER THE COUNTER MEDICATION Take 2 capsules by mouth daily. PROBIOTIC 11    . rOPINIRole (REQUIP) 1 MG tablet TAKE 1 TABLET BY MOUTH 3 TIMES DAILY  (Patient taking differently: TAKE 2 BY MOUTH AT BEDTIME) 90 tablet 1   No facility-administered medications prior to visit.    Allergies  Allergen Reactions  . Codeine Nausea Only    Syncope   . Melatonin Anxiety    ROS As per HPI  PE: Blood pressure 128/86, pulse 78, temperature 97.9 F (36.6 C), temperature source Oral, resp. rate 16, height 5\' 4"  (1.626 m), weight 163 lb 4 oz (74.05 kg), last menstrual period 01/14/2015, SpO2 96 %. VS: noted--normal. Gen: alert, NAD, NONTOXIC APPEARING. HEENT: eyes without injection, drainage, or swelling.  Ears: EACs clear, TMs with normal light reflex and landmarks.  Nose: Clear rhinorrhea, with some dried, crusty exudate adherent to mildly injected mucosa.  No purulent d/c.  No paranasal sinus TTP.  No facial swelling.  Throat and mouth without focal lesion.  No pharyngial swelling, erythema, or exudate.   Neck: supple, no LAD.   LUNGS: CTA bilat, nonlabored resps.   CV: RRR, no m/r/g. EXT: no c/c/e SKIN: no rash  LABS:  none  IMPRESSION AND PLAN:  Viral URI with cough vs acute bacterial sinusitis (given the severity of her sx's). Discussed options and decided to try z-pack. Also hycodan syrup 1-2 tsp bid prn. Get otc generic robitussin DM OR Mucinex DM and use as directed on the packaging for  cough and congestion. Use otc generic saline nasal spray 2-3 times per day to irrigate/moisturize your nasal passages.  An After Visit Summary was printed and given to the patient.  FOLLOW UP: Return if symptoms worsen or fail to improve.

## 2015-01-22 NOTE — Patient Instructions (Signed)
Get otc generic robitussin DM OR Mucinex DM and use as directed on the packaging for cough and congestion. Use otc generic saline nasal spray 2-3 times per day to irrigate/moisturize your nasal passages.   

## 2015-01-22 NOTE — Progress Notes (Signed)
Pre visit review using our clinic review tool, if applicable. No additional management support is needed unless otherwise documented below in the visit note. 

## 2015-02-02 ENCOUNTER — Other Ambulatory Visit: Payer: Self-pay | Admitting: Family Medicine

## 2015-02-03 ENCOUNTER — Ambulatory Visit: Payer: BLUE CROSS/BLUE SHIELD | Admitting: Family Medicine

## 2015-02-12 ENCOUNTER — Telehealth: Payer: Self-pay | Admitting: *Deleted

## 2015-02-12 MED ORDER — HYDROCODONE-HOMATROPINE 5-1.5 MG/5ML PO SYRP: ORAL_SOLUTION | ORAL | Status: DC

## 2015-02-12 MED ORDER — AMOXICILLIN-POT CLAVULANATE 875-125 MG PO TABS: 1.0000 | ORAL_TABLET | Freq: Two times a day (BID) | ORAL | Status: DC

## 2015-02-12 NOTE — Telephone Encounter (Signed)
Pt LMOM on 02/12/15 at 8:56am stating that she finished the zpack and cough syrup that was given to her at her last ov on 01/22/15. She stated that her symptoms did not resolve but did improve with the zpack. She stated that the symptoms are back at full force, sinus pressure/pain severe cough. She is requesting another antibiotic and refill for the cough syrup. Please advise. Thanks.

## 2015-02-12 NOTE — Telephone Encounter (Signed)
Augmentin eRx'd. Printed hycodan--she'll have to come pick this up.

## 2015-02-12 NOTE — Telephone Encounter (Signed)
Pt advised and voiced understanding.  Rx put up front for p/u.  

## 2015-02-14 HISTORY — PX: INTRAUTERINE DEVICE INSERTION: SHX323

## 2015-02-24 ENCOUNTER — Encounter: Payer: Self-pay | Admitting: Family Medicine

## 2015-02-24 ENCOUNTER — Ambulatory Visit (INDEPENDENT_AMBULATORY_CARE_PROVIDER_SITE_OTHER): Payer: BLUE CROSS/BLUE SHIELD | Admitting: Family Medicine

## 2015-02-24 DIAGNOSIS — M75101 Unspecified rotator cuff tear or rupture of right shoulder, not specified as traumatic: Secondary | ICD-10-CM

## 2015-02-24 NOTE — Assessment & Plan Note (Signed)
Discussed with patient at great length. I do feel that patient does have a rotator cuff tear that has healed fairly well. I do think that she still has some reactive subacromial bursitis. We discussed icing regimen, topical anti-inflammatories and patient declined formal physical therapy. We discussed which activities to do an which ones to avoid. We discussed the possibility of injection which patient wanted to hold. Patient is going to start increasing her activity and we discussed proper ergonomics throughout the day. Patient is going to try to make these changes and will come back in 2 weeks if not improved we'll consider injection. Otherwise will come back in 6 weeks and we'll likely ultrasound the area to make sure there is proper healing of the rotator cuff.  Spent  25 minutes with patient face-to-face and had greater than 50% of counseling including as described above in assessment and plan.

## 2015-02-24 NOTE — Patient Instructions (Signed)
Great to see you Madeline Murphy is yoru friend Start doing the exercises of mine 3 times a week religiously OK to start lifting but start 25% and increase 10% a week With the shoulder exercises no weight first week and slowly increase.  If pain take one step back Keep hands within peripheral vision with all lifts See me again in 2 weeks if not happy and we will do injection Happy New Year!

## 2015-02-24 NOTE — Progress Notes (Signed)
[name] [address] [place], [place] [address] Phone: [phone] Subjective:    I'm seeing this patient by the request  of:  MCGOWEN,PHILIP H, MD   CC: Shoulder pain right-sided follow up  [id number] Madeline Murphy is a 40 y.o. female coming in with complaint of right-sided shoulder pain. Has had this pain for 2 months. Patient does CrossFit regularly. Patient was found to have a rotator cuff tear and has avoided certain activities and has not been lifting. Patient seemed to be having more of a healing going on them. Patient describes the pain as a dull, throbbing aching sensation. It is still there but significantly improved. Patient has been noncompliant with the exercises secondary to the holiday she states. Denies any new symptoms. Patient is concerned though that she'll have worsening pain when she increases activity. Does not want to do formal physical therapy.  Past Medical History  Diagnosis Date  . Bronchitis 01/30/2011  . Vaginitis 06/02/2011  . Sinusitis 02/19/2012  . Strep pharyngitis 02/19/2012  . Overweight(278.02) 06/02/2012  . Other and unspecified hyperlipidemia 09/12/2012  . Insomnia 09/12/2012  . RLS (restless legs syndrome) 09/12/2012  . Depression with anxiety 09/12/2012    With some PMS components  . Right arm pain 08/03/2014  . Right shoulder pain 08/03/2014   Past Surgical History  Procedure Laterality Date  . Chronc compartmenal syndrome      S/P ortho surgery x3   Social History  Substance Use Topics  . Smoking status: Never Smoker   . Smokeless tobacco: Never Used  . Alcohol Use: Yes   Allergies  Allergen Reactions  . Codeine Nausea Only    Syncope   . Melatonin Anxiety   Family History  Problem Relation Age of Onset  . Coronary artery disease    . Hypertension    . Hyperlipidemia Mother   . Heart disease Mother     palpitation  . Hyperlipidemia Father   . Hypertension Father   . Heart disease Father     s/p  bypass and stent, passed from MI 06/24/14  . Otitis media Daughter     recurrent strep  . Cancer Maternal Aunt     breast  . Cancer Maternal Grandfather     possibly stomach  . Aneurysm Paternal Grandmother     likely brain  . Cancer Paternal Grandmother     breast  . Otitis media Daughter         Past medical history, social, surgical and family history all reviewed and no pertinent info pertaining to chief complaint. All other was reviewed using the EMR.    Patient did have x-rays. X-rays were reviewed by me. X-rays taken 12/25/2014 show no significant arthritis or any bony abnormality.  Review of Systems: No headache, visual changes, nausea, vomiting, diarrhea, constipation, dizziness, abdominal pain, skin rash, fevers, chills, night sweats, weight loss, swollen lymph nodes, body aches, joint swelling, muscle aches, chest pain, shortness of breath, mood changes.   Objective There were no vitals taken for this visit.  General: No apparent distress alert and oriented x3 mood and affect normal, dressed appropriately.  HEENT: Pupils equal, extraocular movements intact  Respiratory: Patient's speak in full sentences and does not appear short of breath  Cardiovascular: No lower extremity edema, non tender, no erythema  Skin: Warm dry intact with no signs of infection or rash on extremities or on axial skeleton.  Abdomen: Soft nontender  Neuro: Cranial nerves II through XII are  intact, neurovascularly intact in all extremities with 2+ DTRs and 2+ pulses.  Lymph: No lymphadenopathy of posterior or anterior cervical chain or axillae bilaterally.  Gait normal with good balance and coordination.  MSK:  Non tender with full range of motion and good stability and symmetric strength and tone of , elbows, wrist, hip, knee and ankles bilaterally.  Shoulder: Right Poor posture noted. Inspection reveals no abnormalities, atrophy or asymmetry. Palpation is normal with no tenderness over AC  joint or bicipital groove. ROM is full in all planes passively. Rotator cuff is now 5 out of 5 strength and symmetric to the contralateral side signs of impingement with positive Neer and Hawkin's tests, but negative empty can sign. Speeds and Yergason's tests normal. Mild positive O'Brien's. Normal scapular function observed. No painful arc and no drop arm sign. No apprehension sign Contralateral shoulder unremarkable.       Impression and Recommendations:     This case required medical decision making of moderate complexity.

## 2015-03-10 ENCOUNTER — Ambulatory Visit (INDEPENDENT_AMBULATORY_CARE_PROVIDER_SITE_OTHER): Payer: BLUE CROSS/BLUE SHIELD | Admitting: Family Medicine

## 2015-03-10 ENCOUNTER — Encounter: Payer: Self-pay | Admitting: Family Medicine

## 2015-03-10 ENCOUNTER — Other Ambulatory Visit: Payer: BLUE CROSS/BLUE SHIELD

## 2015-03-10 VITALS — BP 128/82 | HR 64 | Wt 164.0 lb

## 2015-03-10 DIAGNOSIS — M75101 Unspecified rotator cuff tear or rupture of right shoulder, not specified as traumatic: Secondary | ICD-10-CM

## 2015-03-10 NOTE — Assessment & Plan Note (Signed)
Patient was given an injection today and tolerated the procedure well. Patient come to do icing protocol, and start increasing activity. Given a exercise prescription. We discussed different ergonomics and lifting mechanics with patient. Patient is going to try to avoid certain lifting and will come back and see me again in 3-4 weeks.  Spent  25 minutes with patient face-to-face and had greater than 50% of counseling including as described above in assessment and plan.

## 2015-03-10 NOTE — Patient Instructions (Signed)
Good to see you  Ice is your friend OK to start in 2 days to increase lifting about 10-15% a week Ice after activity  OK with the discomfort but not ok with pain See me again in 3-4 weeks!

## 2015-03-10 NOTE — Progress Notes (Signed)
[name] [address] [place], [place] [address] Phone: [phone] Subjective:    I'm seeing this patient by the request  of:  MCGOWEN,PHILIP H, MD   CC: Shoulder pain right-sided follow up  [id number] Madeline Murphy is a 40 y.o. female coming in with complaint of right-sided shoulder pain. 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 Patient had been doing regular regimen except for heavy lifting. Patient didn't have an exercise progression was to increase her activity. Patient states she is not having any significant pain with regular daily activities. States that she is sleeping comfortably at night. Patient is concerned because she winces to be pain-free. Patient is traveling in the near future and is concerned that she'll have an exacerbation of pain down there.  X-ray right shoulder was individually visualized by me again. Patient's right shoulder on 12/25/2014 show no bony abnormality.  Past Medical History  Diagnosis Date  . Bronchitis 01/30/2011  . Vaginitis 06/02/2011  . Sinusitis 02/19/2012  . Strep pharyngitis 02/19/2012  . Overweight(278.02) 06/02/2012  . Other and unspecified hyperlipidemia 09/12/2012  . Insomnia 09/12/2012  . RLS (restless legs syndrome) 09/12/2012  . Depression with anxiety 09/12/2012    With some PMS components  . Right arm pain 08/03/2014  . Right shoulder pain 08/03/2014   Past Surgical History  Procedure Laterality Date  . Chronc compartmenal syndrome      S/P ortho surgery x3   Social History  Substance Use Topics  . Smoking status: Never Smoker   . Smokeless tobacco: Never Used  . Alcohol Use: Yes   Allergies  Allergen Reactions  . Codeine Nausea Only    Syncope   . Melatonin Anxiety   Family History  Problem Relation Age of Onset  . Coronary artery disease    . Hypertension    . Hyperlipidemia Mother   . Heart disease Mother     palpitation  . Hyperlipidemia Father   .  Hypertension Father   . Heart disease Father     s/p bypass and stent, passed from MI 06/24/14  . Otitis media Daughter     recurrent strep  . Cancer Maternal Aunt     breast  . Cancer Maternal Grandfather     possibly stomach  . Aneurysm Paternal Grandmother     likely brain  . Cancer Paternal Grandmother     breast  . Otitis media Daughter         Past medical history, social, surgical and family history all reviewed and no pertinent info pertaining to chief complaint. All other was reviewed using the EMR.    Patient did have x-rays. X-rays were reviewed by me. X-rays taken 12/25/2014 show no significant arthritis or any bony abnormality.  Review of Systems: No headache, visual changes, nausea, vomiting, diarrhea, constipation, dizziness, abdominal pain, skin rash, fevers, chills, night sweats, weight loss, swollen lymph nodes, body aches, joint swelling, muscle aches, chest pain, shortness of breath, mood changes.   Objective Blood pressure 128/82, pulse 64, weight 164 lb (74.39 kg), SpO2 98 %.  General: No apparent distress alert and oriented x3 mood and affect normal, dressed appropriately.  HEENT: Pupils equal, extraocular movements intact  Respiratory: Patient's speak in full sentences and does not appear short of breath  Cardiovascular: No lower extremity edema, non tender, no erythema  Skin: Warm dry intact with no signs of infection or rash on extremities or on axial skeleton.  Abdomen: Soft  nontender  Neuro: Cranial nerves II through XII are intact, neurovascularly intact in all extremities with 2+ DTRs and 2+ pulses.  Lymph: No lymphadenopathy of posterior or anterior cervical chain or axillae bilaterally.  Gait normal with good balance and coordination.  MSK:  Non tender with full range of motion and good stability and symmetric strength and tone of , elbows, wrist, hip, knee and ankles bilaterally.  Shoulder: Right Poor posture noted. Inspection reveals no  abnormalities, atrophy or asymmetry. Palpation is normal with no tenderness over AC joint or bicipital groove. ROM is full in all planes passively. Rotator cuff is now 5 out of 5 strength and symmetric to the contralateral side patient does have pain with strength testing signs of impingement with positive Neer and Hawkin's tests, but negative empty can sign. Speeds and Yergason's tests normal. Mild positive O'Brien's. Normal scapular function observed. No painful arc and no drop arm sign. No apprehension sign Contralateral shoulder unremarkable.   Procedure: Real-time Ultrasound Guided Injection of right glenohumeral joint Device: GE Logiq E  Ultrasound guided injection is preferred based studies that show increased duration, increased effect, greater accuracy, decreased procedural pain, increased response rate with ultrasound guided versus blind injection.  Verbal informed consent obtained.  Time-out conducted.  Noted no overlying erythema, induration, or other signs of local infection.  Skin prepped in a sterile fashion.  Local anesthesia: Topical Ethyl chloride.  With sterile technique and under real time ultrasound guidance:  Joint visualized.  23g 1  inch needle inserted posterior approach. Pictures taken for needle placement. Patient did have injection of 2 cc of 1% lidocaine, 2 cc of 0.5% Marcaine, and 1.0 cc of Kenalog 40 mg/dL. Completed without difficulty  Pain immediately resolved suggesting accurate placement of the medication.  Advised to call if fevers/chills, erythema, induration, drainage, or persistent bleeding.  Images permanently stored and available for review in the ultrasound unit.  Impression: Technically successful ultrasound guided injection.     Impression and Recommendations:     This case required medical decision making of moderate complexity.

## 2015-03-15 ENCOUNTER — Other Ambulatory Visit: Payer: Self-pay | Admitting: *Deleted

## 2015-03-15 MED ORDER — ROPINIROLE HCL 1 MG PO TABS: 1.0000 mg | ORAL_TABLET | Freq: Three times a day (TID) | ORAL | Status: DC

## 2015-03-15 NOTE — Telephone Encounter (Signed)
Pt LMOM on 03/15/15 at 8:24am requesting refill.   RF request for ropinirole LOV: 01/22/15 Next ov: 08/04/15 Last written: 02/02/15 #90 w/ 0RF  Rx sent for #90 w/ 0RF. Left message on cell stating that Rx was sent.

## 2015-03-31 ENCOUNTER — Ambulatory Visit (INDEPENDENT_AMBULATORY_CARE_PROVIDER_SITE_OTHER): Payer: BLUE CROSS/BLUE SHIELD | Admitting: Family Medicine

## 2015-03-31 ENCOUNTER — Encounter: Payer: Self-pay | Admitting: Family Medicine

## 2015-03-31 VITALS — BP 128/82 | HR 90 | Ht 64.0 in | Wt 163.0 lb

## 2015-03-31 DIAGNOSIS — M75101 Unspecified rotator cuff tear or rupture of right shoulder, not specified as traumatic: Secondary | ICD-10-CM | POA: Diagnosis not present

## 2015-03-31 NOTE — Assessment & Plan Note (Signed)
Doing significantly better at this time. I think that she will do well. I am concern for an underlying labral pathology. If there is such obtained we may need advanced imaging. Patient though given an exercise prescription will start increasing her activity. We discussed icing regimen. We discussed what activities to avoid. Patient will follow-up in 4 weeks for further evaluation. Spent  25 minutes with patient face-to-face and had greater than 50% of counseling including as described above in assessment and plan.

## 2015-03-31 NOTE — Progress Notes (Signed)
Pre visit review using our clinic review tool, if applicable. No additional management support is needed unless otherwise documented below in the visit note. 

## 2015-03-31 NOTE — Patient Instructions (Signed)
Great to see you  Madeline Murphy is your friend Drop my exercises to 2 times  A week Lift 50% and increase 10% a week Continue the vitamins See me again in 4 weeks.

## 2015-03-31 NOTE — Progress Notes (Signed)
[name] [address] [place], [place] [address] Phone: [phone] Subjective:    I'm seeing this patient by the request  of:  MCGOWEN,PHILIP H, MD   CC: Shoulder pain right-sided follow up  [id number] Madeline Murphy is a 40 y.o. female coming in with complaint of right-sided shoulder pain. 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 There was a concern with patient having also a labral pathology. Patient was given an injection. Has been doing significantly better. States approximately 85% better. No radiation the pain. Sleeping comfortably at night. Still has some mild discomfort with certain range of motion. Has not been doing any lifting on a regular basis though.  X-ray right shoulder was individually visualized by me again. Patient's right shoulder on 12/25/2014 show no bony abnormality.  Past Medical History  Diagnosis Date  . Bronchitis 01/30/2011  . Vaginitis 06/02/2011  . Sinusitis 02/19/2012  . Strep pharyngitis 02/19/2012  . Overweight(278.02) 06/02/2012  . Other and unspecified hyperlipidemia 09/12/2012  . Insomnia 09/12/2012  . RLS (restless legs syndrome) 09/12/2012  . Depression with anxiety 09/12/2012    With some PMS components  . Right arm pain 08/03/2014  . Right shoulder pain 08/03/2014   Past Surgical History  Procedure Laterality Date  . Chronc compartmenal syndrome      S/P ortho surgery x3   Social History  Substance Use Topics  . Smoking status: Never Smoker   . Smokeless tobacco: Never Used  . Alcohol Use: Yes   Allergies  Allergen Reactions  . Codeine Nausea Only    Syncope   . Melatonin Anxiety   Family History  Problem Relation Age of Onset  . Coronary artery disease    . Hypertension    . Hyperlipidemia Mother   . Heart disease Mother     palpitation  . Hyperlipidemia Father   . Hypertension Father   . Heart disease Father     s/p bypass and stent, passed from MI 06/24/14  .  Otitis media Daughter     recurrent strep  . Cancer Maternal Aunt     breast  . Cancer Maternal Grandfather     possibly stomach  . Aneurysm Paternal Grandmother     likely brain  . Cancer Paternal Grandmother     breast  . Otitis media Daughter         Past medical history, social, surgical and family history all reviewed and no pertinent info pertaining to chief complaint. All other was reviewed using the EMR.    Patient did have x-rays. X-rays were reviewed by me. X-rays taken 12/25/2014 show no significant arthritis or any bony abnormality.  Review of Systems: No headache, visual changes, nausea, vomiting, diarrhea, constipation, dizziness, abdominal pain, skin rash, fevers, chills, night sweats, weight loss, swollen lymph nodes, body aches, joint swelling, muscle aches, chest pain, shortness of breath, mood changes.   Objective Blood pressure 128/82, pulse 90, weight 163 lb (73.936 kg), SpO2 97 %.  General: No apparent distress alert and oriented x3 mood and affect normal, dressed appropriately.  HEENT: Pupils equal, extraocular movements intact  Respiratory: Patient's speak in full sentences and does not appear short of breath  Cardiovascular: No lower extremity edema, non tender, no erythema  Skin: Warm dry intact with no signs of infection or rash on extremities or on axial skeleton.  Abdomen: Soft nontender  Neuro: Cranial nerves II through XII are intact, neurovascularly intact in all extremities with  2+ DTRs and 2+ pulses.  Lymph: No lymphadenopathy of posterior or anterior cervical chain or axillae bilaterally.  Gait normal with good balance and coordination.  MSK:  Non tender with full range of motion and good stability and symmetric strength and tone of , elbows, wrist, hip, knee and ankles bilaterally.  Shoulder: Right Inspection reveals no abnormalities, atrophy or asymmetry. Palpation is normal with no tenderness over AC joint or bicipital groove. ROM is full in  all planes passively. 5 out of 5 strength. Speeds and Yergason's tests normal. Mild positive O'Brien's. Normal scapular function observed. No painful arc and no drop arm sign. No apprehension sign Contralateral shoulder unremarkable.       Impression and Recommendations:     This case required medical decision making of moderate complexity.

## 2015-04-21 ENCOUNTER — Ambulatory Visit: Payer: BLUE CROSS/BLUE SHIELD | Admitting: Family Medicine

## 2015-05-05 ENCOUNTER — Other Ambulatory Visit: Payer: Self-pay | Admitting: Family Medicine

## 2015-05-06 ENCOUNTER — Other Ambulatory Visit: Payer: Self-pay | Admitting: *Deleted

## 2015-05-06 MED ORDER — ROPINIROLE HCL 1 MG PO TABS: ORAL_TABLET | ORAL | Status: DC

## 2015-05-06 NOTE — Telephone Encounter (Signed)
Pt advised and voiced understanding.   

## 2015-05-06 NOTE — Telephone Encounter (Signed)
Pt LMOM on 05/06/15 at 12:59pm requesting refill. She stated that she is out and needs this refilled as soon as possible. She also requested 90 day supply.   RF request for requip LOV: 12/25/14 seen by Charlett Blake Next ov: 08/04/15 Last written: 09/17/14 #90 w/ 1RF  Please advise. Thanks.

## 2015-05-06 NOTE — Telephone Encounter (Signed)
Faxed hardcopy for Zolpidem to [facility] in [place]

## 2015-05-20 ENCOUNTER — Ambulatory Visit (INDEPENDENT_AMBULATORY_CARE_PROVIDER_SITE_OTHER): Payer: BLUE CROSS/BLUE SHIELD | Admitting: Family Medicine

## 2015-05-20 ENCOUNTER — Encounter: Payer: Self-pay | Admitting: Family Medicine

## 2015-05-20 VITALS — BP 130/84 | HR 83 | Ht 64.0 in | Wt 174.0 lb

## 2015-05-20 DIAGNOSIS — M940 Chondrocostal junction syndrome [Tietze]: Secondary | ICD-10-CM

## 2015-05-20 DIAGNOSIS — M75101 Unspecified rotator cuff tear or rupture of right shoulder, not specified as traumatic: Secondary | ICD-10-CM

## 2015-05-20 DIAGNOSIS — M999 Biomechanical lesion, unspecified: Secondary | ICD-10-CM

## 2015-05-20 DIAGNOSIS — M9902 Segmental and somatic dysfunction of thoracic region: Secondary | ICD-10-CM | POA: Diagnosis not present

## 2015-05-20 DIAGNOSIS — M9901 Segmental and somatic dysfunction of cervical region: Secondary | ICD-10-CM

## 2015-05-20 DIAGNOSIS — M9908 Segmental and somatic dysfunction of rib cage: Secondary | ICD-10-CM

## 2015-05-20 MED ORDER — GABAPENTIN 100 MG PO CAPS: 100.0000 mg | ORAL_CAPSULE | Freq: Every day | ORAL | Status: DC

## 2015-05-20 MED ORDER — MELOXICAM 15 MG PO TABS: 15.0000 mg | ORAL_TABLET | Freq: Every day | ORAL | Status: DC

## 2015-05-20 NOTE — Progress Notes (Signed)
[name] [address] [place],  [address] Phone: [phone] Subjective:    I'm seeing this patient by the request  of:  Madeline H, MD   CC: Shoulder pain right-sided follow up  [id number] Madeline Murphy is a 40 y.o. female coming in with complaint of right-sided shoulder pain. 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 There was a concern with patient having also a labral pathology. Patient was given an injection. Patient was given an injection again in 10 weeks ago. Patient had been responding fairly well at last follow-up. Patient was to increase her activity slowly. Patient was to use only ibuprofen for breakthrough pain. Patient states right side is feeling significantly better and didn't really pain-free.  Patient is now having more of a left-sided shoulder pain. States that it feels very similar to the contralateral sign. Patient states that now weakness. States that he can catch her breath when she makes certain movements. Patient states that it seemed to be worse after her doing some cleans in the gym. Having some mild radiation that seems to be going down her arm. States that he can even across her chest would not stricture chest pain in his only hurting her with activity.   Past Medical History  Diagnosis Date  . Bronchitis 01/30/2011  . Vaginitis 06/02/2011  . Sinusitis 02/19/2012  . Strep pharyngitis 02/19/2012  . Overweight(278.02) 06/02/2012  . Other and unspecified hyperlipidemia 09/12/2012  . Insomnia 09/12/2012  . RLS (restless legs syndrome) 09/12/2012  . Depression with anxiety 09/12/2012    With some PMS components  . Right arm pain 08/03/2014  . Right shoulder pain 08/03/2014   Past Surgical History  Procedure Laterality Date  . Chronc compartmenal syndrome      S/P ortho surgery x3   Social History  Substance Use Topics  . Smoking status: Never Smoker   . Smokeless tobacco: Never Used  .  Alcohol Use: Yes   Allergies  Allergen Reactions  . Codeine Nausea Only    Syncope   . Melatonin Anxiety   Family History  Problem Relation Age of Onset  . Coronary artery disease    . Hypertension    . Hyperlipidemia Mother   . Heart disease Mother     palpitation  . Hyperlipidemia Father   . Hypertension Father   . Heart disease Father     s/p bypass and stent, passed from MI 06/24/14  . Otitis media Daughter     recurrent strep  . Cancer Maternal Aunt     breast  . Cancer Maternal Grandfather     possibly stomach  . Aneurysm Paternal Grandmother     likely brain  . Cancer Paternal Grandmother     breast  . Otitis media Daughter         Past medical history, social, surgical and family history all reviewed and no pertinent info pertaining to chief complaint. All other was reviewed using the EMR.   Review of Systems: No headache, visual changes, nausea, vomiting, diarrhea, constipation, dizziness, abdominal pain, skin rash, fevers, chills, night sweats, weight loss, swollen lymph nodes, body aches, joint swelling, muscle aches, chest pain, shortness of breath, mood changes.   Objective Blood pressure 130/84, pulse 83, height 5\' 4"  (1.626 m), weight 174 lb (78.926 kg), SpO2 98 %.  General: No apparent distress alert and oriented x3 mood and affect normal, dressed appropriately.  HEENT: Pupils equal, extraocular movements intact  Respiratory: Patient's speak in full sentences and does not appear short of breath  Cardiovascular: No lower extremity edema, non tender, no erythema  Skin: Warm dry intact with no signs of infection or rash on extremities or on axial skeleton.  Abdomen: Soft nontender  Neuro: Cranial nerves II through XII are intact, neurovascularly intact in all extremities with 2+ DTRs and 2+ pulses.  Lymph: No lymphadenopathy of posterior or anterior cervical chain or axillae bilaterally.  Gait normal with good balance and coordination.  MSK:  Non tender  with full range of motion and good stability and symmetric strength and tone of , elbows, wrist, hip, knee and ankles bilaterally.  Shoulder: Right Inspection reveals no abnormalities, atrophy or asymmetry. Palpation is normal with no tenderness over AC joint or bicipital groove. ROM is full in all planes passively. 5 out of 5 strength. Speeds and Yergason's tests normal. Mild positive O'Brien still noted on exam today Normal scapular function observed. No painful arc and no drop arm sign. No apprehension sign Contralateral shoulder shows positive Neer's and Hawkins. Patient does have what appears to be almost a positive pain with compression of the thoracic outlet.   Osteopathic findings C7 flexed rotated and side bent left T3 extended rotated and side bent right T7 extended rotated and side bent leftwith inhaled rib         Impression and Recommendations:     This case required medical decision making of moderate complexity.

## 2015-05-20 NOTE — Assessment & Plan Note (Signed)
Decision today to treat with OMT was based on Physical Exam  After verbal consent patient was treated with HVLE, ME, FPR techniques in cervical, thoracic and rib areas  Patient tolerated the procedure well with improvement in symptoms  Patient given exercises, stretches and lifestyle modifications  See medications in patient instructions if given  Patient will follow up in 3-4 weeks

## 2015-05-20 NOTE — Assessment & Plan Note (Signed)
I believe that patient's left-sided pain seems to be mild and does not seem to be asthma shoulder pathology. We discussed icing regimen. We discussed home exercises. Patient did respond fairly well to osteopathic manipulation. We discussed different posture and ergonomics or other day. Patient will come back and see me again in 3 weeks. If worsening symptoms we'll consider ultrasound of the shoulder to make sure and no significant pathology.

## 2015-05-20 NOTE — Assessment & Plan Note (Signed)
Seems to have resolved on the right side at this point.

## 2015-05-20 NOTE — Patient Instructions (Addendum)
Good to see you  Stop doing stuff! No lifting til Monday  Ice 20 minutes 2 times daily. Usually after activity and before bed. Meloxicam daily for 10 days then as needed Gabapentin 100mg  at night See me again in 3 weeks and if not better lets try an injection or we can continue manipulation

## 2015-05-20 NOTE — Progress Notes (Signed)
Pre visit review using our clinic review tool, if applicable. No additional management support is needed unless otherwise documented below in the visit note. 

## 2015-05-27 ENCOUNTER — Ambulatory Visit: Payer: BLUE CROSS/BLUE SHIELD | Admitting: Family Medicine

## 2015-06-09 ENCOUNTER — Other Ambulatory Visit (INDEPENDENT_AMBULATORY_CARE_PROVIDER_SITE_OTHER): Payer: BLUE CROSS/BLUE SHIELD

## 2015-06-09 ENCOUNTER — Encounter: Payer: Self-pay | Admitting: Family Medicine

## 2015-06-09 ENCOUNTER — Ambulatory Visit (INDEPENDENT_AMBULATORY_CARE_PROVIDER_SITE_OTHER): Payer: BLUE CROSS/BLUE SHIELD | Admitting: Family Medicine

## 2015-06-09 VITALS — BP 118/80 | HR 71 | Ht 64.0 in | Wt 172.0 lb

## 2015-06-09 DIAGNOSIS — M9902 Segmental and somatic dysfunction of thoracic region: Secondary | ICD-10-CM | POA: Diagnosis not present

## 2015-06-09 DIAGNOSIS — M25511 Pain in right shoulder: Secondary | ICD-10-CM

## 2015-06-09 DIAGNOSIS — M940 Chondrocostal junction syndrome [Tietze]: Secondary | ICD-10-CM

## 2015-06-09 DIAGNOSIS — M7552 Bursitis of left shoulder: Secondary | ICD-10-CM | POA: Diagnosis not present

## 2015-06-09 DIAGNOSIS — M9908 Segmental and somatic dysfunction of rib cage: Secondary | ICD-10-CM | POA: Diagnosis not present

## 2015-06-09 DIAGNOSIS — M999 Biomechanical lesion, unspecified: Secondary | ICD-10-CM

## 2015-06-09 DIAGNOSIS — M9901 Segmental and somatic dysfunction of cervical region: Secondary | ICD-10-CM | POA: Diagnosis not present

## 2015-06-09 NOTE — Assessment & Plan Note (Signed)
Decision today to treat with OMT was based on Physical Exam  After verbal consent patient was treated with HVLE, ME, FPR techniques in cervical, thoracic and rib areas  Patient tolerated the procedure well with improvement in symptoms  Patient given exercises, stretches and lifestyle modifications  See medications in patient instructions if given  Patient will follow up in 4-6 weeks

## 2015-06-09 NOTE — Assessment & Plan Note (Signed)
An injection today and tolerated the procedure well. We discussed icing regimen and home exercises. We discussed which activities to do an which was to avoid. Patient has some topical anti-inflammatories and we discussed continuing the natural supplementation. Patient given an exercise prescription to increase activity slowly. Follow up in 4 weeks.

## 2015-06-09 NOTE — Progress Notes (Signed)
Pre visit review using our clinic review tool, if applicable. No additional management support is needed unless otherwise documented below in the visit note. 

## 2015-06-09 NOTE — Assessment & Plan Note (Signed)
Concurrent posture changes in patient. We discussed ergonomic throughout the day. Continues respond well to osteopathic manipulation. Follow-up again in 4-6 weeks.

## 2015-06-09 NOTE — Patient Instructions (Signed)
Good to see you  Keep it easy until Monday  Ice in 6 hours.  I feel this is more a bursitis and should get better faster See me again in 4 weeks if not perfect

## 2015-06-09 NOTE — Progress Notes (Signed)
[name] [place], [place] [address] Phone: [phone] Subjective:    I'm seeing this patient by the request  of:  MCGOWEN,PHILIP H, MD   CC: shoulder pain left sign  [id number] Madeline Murphy is a 40 y.o. female coming in with complaint of right-sided shoulder pain. Patient was seen for rotator cuff tear on the right side that has completely resolved. Patient was coming in with recurrent left shoulder pain. Feels that it has not made any significant improvement. Feels that the gabapentin as well as the meloxicam does not seem to be helpful. When patient tries to do any lifting overhead she has worsening pain.patient states that she does not need any significant improvement since last visit. If anything it seems to be worsening. Not as severe as what her right side was initially but still stopping her from certain activities.  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 Patient states some mild tightness noted. Has responded very well to osteopathic manipulation. No radicular symptoms.   Past Medical History  Diagnosis Date  . Bronchitis 01/30/2011  . Vaginitis 06/02/2011  . Sinusitis 02/19/2012  . Strep pharyngitis 02/19/2012  . Overweight(278.02) 06/02/2012  . Other and unspecified hyperlipidemia 09/12/2012  . Insomnia 09/12/2012  . RLS (restless legs syndrome) 09/12/2012  . Depression with anxiety 09/12/2012    With some PMS components  . Right arm pain 08/03/2014  . Right shoulder pain 08/03/2014   Past Surgical History  Procedure Laterality Date  . Chronc compartmenal syndrome      S/P ortho surgery x3   Social History  Substance Use Topics  . Smoking status: Never Smoker   . Smokeless tobacco: Never Used  . Alcohol Use: Yes   Allergies  Allergen Reactions  . Codeine Nausea Only    Syncope   . Melatonin Anxiety   Family History  Problem Relation Age of Onset  . Coronary artery  disease    . Hypertension    . Hyperlipidemia Mother   . Heart disease Mother     palpitation  . Hyperlipidemia Father   . Hypertension Father   . Heart disease Father     s/p bypass and stent, passed from MI 06/24/14  . Otitis media Daughter     recurrent strep  . Cancer Maternal Aunt     breast  . Cancer Maternal Grandfather     possibly stomach  . Aneurysm Paternal Grandmother     likely brain  . Cancer Paternal Grandmother     breast  . Otitis media Daughter         Past medical history, social, surgical and family history all reviewed and no pertinent info pertaining to chief complaint. All other was reviewed using the EMR.   Review of Systems: No headache, visual changes, nausea, vomiting, diarrhea, constipation, dizziness, abdominal pain, skin rash, fevers, chills, night sweats, weight loss, swollen lymph nodes, body aches, joint swelling, muscle aches, chest pain, shortness of breath, mood changes.   Objective Blood pressure 118/80, pulse 71, height 5\' 4"  (1.626 m), weight 172 lb (78.019 kg), SpO2 99 %.  General: No apparent distress alert and oriented x3 mood and affect normal, dressed appropriately.  HEENT: Pupils equal, extraocular movements intact  Respiratory: Patient's speak in full sentences and does not appear short of breath  Cardiovascular: No lower extremity edema, non tender, no erythema  Skin: Warm dry intact with no signs of infection  or rash on extremities or on axial skeleton.  Abdomen: Soft nontender  Neuro: Cranial nerves II through XII are intact, neurovascularly intact in all extremities with 2+ DTRs and 2+ pulses.  Lymph: No lymphadenopathy of posterior or anterior cervical chain or axillae bilaterally.  Gait normal with good balance and coordination.  MSK:  Non tender with full range of motion and good stability and symmetric strength and tone of , elbows, wrist, hip, knee and ankles bilaterally.  Shoulder: left Inspection reveals no  abnormalities, atrophy or asymmetry. Palpation is normal with no tenderness over AC joint or bicipital groove. ROM is full in all planes passively. Rotator cuff strength normal throughout. signs of impingement with positive Neer and Hawkin's tests, but negative empty can sign. Speeds and Yergason's tests normal. No labral pathology noted with negative Obrien's, negative clunk and good stability. Normal scapular function observed. No painful arc and no drop arm sign. No apprehension sign  MSK US performed of: left This study was ordered, performed, and interpreted by Charlann Boxer D.O.  Shoulder:   Supraspinatus:  Appears normal on long and transverse views, Bursal bulge seen with shoulder abduction on impingement view. Infraspinatus:  Appears normal on long and transverse views. Significant increase in Doppler flow Subscapularis:  Appears normal on long and transverse views. Positive bursa Teres Minor:  Appears normal on long and transverse views. AC joint:  Capsule undistended, no geyser sign. Glenohumeral Joint:  Appears normal without effusion. Glenoid Labrum:  Intact without visualized tears. Biceps Tendon:  Appears normal on long and transverse views, no fraying of tendon, tendon located in intertubercular groove, no subluxation with shoulder internal or external rotation.  Impression: Subacromial bursitis  Procedure: Real-time Ultrasound Guided Injection of left glenohumeral joint Device: GE Logiq E  Ultrasound guided injection is preferred based studies that show increased duration, increased effect, greater accuracy, decreased procedural pain, increased response rate with ultrasound guided versus blind injection.  Verbal informed consent obtained.  Time-out conducted.  Noted no overlying erythema, induration, or other signs of local infection.  Skin prepped in a sterile fashion.  Local anesthesia: Topical Ethyl chloride.  With sterile technique and under real time ultrasound  guidance:  Joint visualized.  23g 1  inch needle inserted posterior approach. Pictures taken for needle placement. Patient did have injection of 2 cc of 1% lidocaine, 2 cc of 0.5% Marcaine, and 1.0 cc of Kenalog 40 mg/dL. Completed without difficulty  Pain immediately resolved suggesting accurate placement of the medication.  Advised to call if fevers/chills, erythema, induration, drainage, or persistent bleeding.  Images permanently stored and available for review in the ultrasound unit.  Impression: Technically successful ultrasound guided injection.    Osteopathic findings C2 flexed rotated and side bent left T3 extended rotated and side bent right T7 extended rotated and side bent leftwith inhaled rib         Impression and Recommendations:     This case required medical decision making of moderate complexity.

## 2015-07-08 ENCOUNTER — Telehealth: Payer: Self-pay | Admitting: Family Medicine

## 2015-07-08 ENCOUNTER — Encounter: Payer: Self-pay | Admitting: Family Medicine

## 2015-07-08 ENCOUNTER — Other Ambulatory Visit: Payer: Self-pay | Admitting: Family Medicine

## 2015-07-08 MED ORDER — FLUCONAZOLE 150 MG PO TABS: ORAL_TABLET | ORAL | Status: DC

## 2015-07-08 NOTE — Telephone Encounter (Signed)
Patient calling to report she has a vaginal infection, she woke up with swelling and itching and wants to know if she can has something called in to the pharmacy or does she need to be seen.  Pharmacy: [facility].

## 2015-07-08 NOTE — Telephone Encounter (Signed)
Pt advised and voiced understanding.   

## 2015-07-08 NOTE — Telephone Encounter (Signed)
I will send in rx for diflucan.  If this does not alleviate her symptoms then she should come in for o/v.

## 2015-07-08 NOTE — Telephone Encounter (Signed)
Please advise. Thanks.  

## 2015-07-13 ENCOUNTER — Ambulatory Visit (INDEPENDENT_AMBULATORY_CARE_PROVIDER_SITE_OTHER): Payer: BLUE CROSS/BLUE SHIELD | Admitting: Family Medicine

## 2015-07-13 ENCOUNTER — Encounter: Payer: Self-pay | Admitting: Family Medicine

## 2015-07-13 VITALS — BP 111/70 | HR 50 | Temp 98.4°F | Resp 20 | Wt 170.5 lb

## 2015-07-13 DIAGNOSIS — N898 Other specified noninflammatory disorders of vagina: Secondary | ICD-10-CM | POA: Diagnosis not present

## 2015-07-13 DIAGNOSIS — R350 Frequency of micturition: Secondary | ICD-10-CM | POA: Diagnosis not present

## 2015-07-13 LAB — POC URINALSYSI DIPSTICK (AUTOMATED)
Bilirubin, UA: NEGATIVE
Blood, UA: NEGATIVE
Glucose, UA: NEGATIVE
KETONES UA: NEGATIVE
LEUKOCYTES UA: NEGATIVE
Nitrite, UA: NEGATIVE
PH UA: 5.5
PROTEIN UA: NEGATIVE
SPEC GRAV UA: 1.015
UROBILINOGEN UA: 0.2

## 2015-07-13 MED ORDER — METRONIDAZOLE 500 MG PO TABS: 500.0000 mg | ORAL_TABLET | Freq: Two times a day (BID) | ORAL | Status: DC

## 2015-07-13 NOTE — Progress Notes (Signed)
Patient ID: Madeline Murphy, female   DOB: 1975-08-12, 40 y.o.   MRN: [id number]    Madeline Murphy , 1975-12-22, 40 y.o., female MRN: [id number]  CC: vaginal discharge. Subjective: Pt presents for an acute OV with complaints of vaginal discharge of 1 week duration. Patient complains of vaginal irrritaiton, swelling and burning. She was prescribed two doses of diflucan that did not help resolve her symptoms and now is wondering if its from a bacteria. She is married and in a monogamous relationship, she has no concerns for STD. She denies abdominal pain, vaginal itching, fever or chills. She endorses increased urinary frequency without dysuria. She denies diarrhea or constipation. She has also tried using vagisil cream, Rubis cider vinegar and cocunut oil to her vaginal area for comfort.  She denies changes in soaps, detergents, use of bubble baths, scented female  products etc.   Allergies  Allergen Reactions  . Codeine Nausea Only    Syncope   . Melatonin Anxiety   Social History  Substance Use Topics  . Smoking status: Never Smoker   . Smokeless tobacco: Never Used  . Alcohol Use: Yes   Past Medical History  Diagnosis Date  . Bronchitis 01/30/2011  . Vaginitis 06/02/2011    recurrent vulvovaginal candidiasis; hx of allergy/intolerance to OTC topical antifungal treatments.  . Sinusitis 02/19/2012  . Strep pharyngitis 02/19/2012  . Overweight(278.02) 06/02/2012  . Other and unspecified hyperlipidemia 09/12/2012  . Insomnia 09/12/2012  . RLS (restless legs syndrome) 09/12/2012  . Depression with anxiety 09/12/2012    With some PMS components  . Right arm pain 08/03/2014  . Right shoulder pain 08/03/2014   Past Surgical History  Procedure Laterality Date  . Chronc compartmenal syndrome      S/P ortho surgery x3   Family History  Problem Relation Age of Onset  . Coronary artery disease    . Hypertension    . Hyperlipidemia Mother   . Heart disease Mother     palpitation  . Hyperlipidemia  Father   . Hypertension Father   . Heart disease Father     s/p bypass and stent, passed from MI 06/24/14  . Otitis media Daughter     recurrent strep  . Cancer Maternal Aunt     breast  . Cancer Maternal Grandfather     possibly stomach  . Aneurysm Paternal Grandmother     likely brain  . Cancer Paternal Grandmother     breast  . Otitis media Daughter      Medication List       This list is accurate as of: 07/13/15  1:47 PM.  Always use your most recent med list.               ethynodiol-ethinyl estradiol 1-35 MG-MCG tablet  Commonly known as:  KELNOR,ZOVIA  Take 1 tablet by mouth daily.     gabapentin 100 MG capsule  Commonly known as:  NEURONTIN  Take 1 capsule (100 mg total) by mouth at bedtime.     ibuprofen 200 MG tablet  Commonly known as:  ADVIL,MOTRIN  Take 400 mg by mouth once as needed. For pain     meloxicam 15 MG tablet  Commonly known as:  MOBIC  Take 1 tablet (15 mg total) by mouth daily.     MULTIVITAMIN PO  Take by mouth daily.     OVER THE COUNTER MEDICATION  Take 2 capsules by mouth daily. PROBIOTIC 11     rOPINIRole 1 MG tablet  Commonly known as:  REQUIP  TAKE 2 BY MOUTH AT BEDTIME     zolpidem 10 MG tablet  Commonly known as:  AMBIEN  TAKE 1 TABLET BY MOUTH AT BEDTIME AS NEEDED.       ROS: Negative, with the exception of above mentioned in HPI  Objective:  BP 111/70 mmHg  Pulse 50  Temp(Src) 98.4 F (36.9 C)  Resp 20  Wt 170 lb 8 oz (77.338 kg)  SpO2 98% Body mass index is 29.25 kg/(m^2). Gen: Afebrile. No acute distress. Nontoxic in appearance, well developed, well nourished, pleasant.  HENT: AT. [place].  MMM, no oral lesions.  Abd: Soft. NTND. BS present. No Masses palpated. No rebound or guarding.  Skin: No rashes, purpura or petechiae. . GYN:  External genitalia with mild erythema, no swelling.  normal hair distribution, no lesions. Urethral meatus normal, no lesions. Vaginal mucosa pink, moist, normal rugae, no lesions,  mild irritation. Mild white discharge, no blood.  Bimanual exam revealed normal uterus.  No bladder/suprapubic fullness, masses or tenderness. No cervical motion tenderness. No adnexal fullness. Anus and perineum within normal limits, no lesions.  Assessment/Plan: Madeline Murphy is a 40 y.o. female present for acute OV for  Vaginal discharge/irritation: - Minimal discharge on exam, mild erythema only. No lesions.  - Wet prep, genital - will treat for BV, and send wet prep.  - DC all homeopathic regimens. Discussed initial infection could have been yeast or Ph imbalance, some hemopathic regimens can make this worse.  - metroNIDAZOLE (FLAGYL) 500 MG tablet; Take 1 tablet (500 mg total) by mouth 2 (two) times daily.  Dispense: 14 tablet; Refill: 0  Urinary frequency - POCT Urinalysis Dipstick (Automated)--> negative.   - F/U PRN. Or if no improvement. Pt will be called with results once available.   electronically signed by:  Howard Pouch, DO  [place]

## 2015-07-13 NOTE — Patient Instructions (Signed)
I have called in diflucan to cover bacteria as a possibility as cause for irritation.  Make certain not to use any new detergents/soaps/vaginal products/bubble baths, especially scented (even tampons etc).  Bacterial Vaginosis Bacterial vaginosis is a vaginal infection that occurs when the normal balance of bacteria in the vagina is disrupted. It results from an overgrowth of certain bacteria. This is the most common vaginal infection in women of childbearing age. Treatment is important to prevent complications, especially in pregnant women, as it can cause a premature delivery. CAUSES  Bacterial vaginosis is caused by an increase in harmful bacteria that are normally present in smaller amounts in the vagina. Several different kinds of bacteria can cause bacterial vaginosis. However, the reason that the condition develops is not fully understood. RISK FACTORS Certain activities or behaviors can put you at an increased risk of developing bacterial vaginosis, including:  Having a new sex partner or multiple sex partners.  Douching.  Using an intrauterine device (IUD) for contraception. Women do not get bacterial vaginosis from toilet seats, bedding, swimming pools, or contact with objects around them. SIGNS AND SYMPTOMS  Some women with bacterial vaginosis have no signs or symptoms. Common symptoms include:  Grey vaginal discharge.  A fishlike odor with discharge, especially after sexual intercourse.  Itching or burning of the vagina and vulva.  Burning or pain with urination. DIAGNOSIS  Your health care provider will take a medical history and examine the vagina for signs of bacterial vaginosis. A sample of vaginal fluid may be taken. Your health care provider will look at this sample under a microscope to check for bacteria and abnormal cells. A vaginal pH test may also be done.  TREATMENT  Bacterial vaginosis may be treated with antibiotic medicines. These may be given in the form of a  pill or a vaginal cream. A second round of antibiotics may be prescribed if the condition comes back after treatment. Because bacterial vaginosis increases your risk for sexually transmitted diseases, getting treated can help reduce your risk for chlamydia, gonorrhea, HIV, and herpes. HOME CARE INSTRUCTIONS   Only take over-the-counter or prescription medicines as directed by your health care provider.  If antibiotic medicine was prescribed, take it as directed. Make sure you finish it even if you start to feel better.  Tell all sexual partners that you have a vaginal infection. They should see their health care provider and be treated if they have problems, such as a mild rash or itching.  During treatment, it is important that you follow these instructions:  Avoid sexual activity or use condoms correctly.  Do not douche.  Avoid alcohol as directed by your health care provider.  Avoid breastfeeding as directed by your health care provider. SEEK MEDICAL CARE IF:   Your symptoms are not improving after 3 days of treatment.  You have increased discharge or pain.  You have a fever. MAKE SURE YOU:   Understand these instructions.  Will watch your condition.  Will get help right away if you are not doing well or get worse. FOR MORE INFORMATION  Centers for Disease Control and Prevention, Division of STD Prevention: [employer] American Sexual Health Association (ASHA): www.ashastd.org    This information is not intended to replace advice given to you by your health care provider. Make sure you discuss any questions you have with your health care provider.   Document Released: 01/30/2005 Document Revised: 02/20/2014 Document Reviewed: 09/11/2012 Elsevier Interactive Patient Education [employer].

## 2015-07-14 ENCOUNTER — Ambulatory Visit (INDEPENDENT_AMBULATORY_CARE_PROVIDER_SITE_OTHER): Payer: BLUE CROSS/BLUE SHIELD | Admitting: Family Medicine

## 2015-07-14 ENCOUNTER — Encounter: Payer: Self-pay | Admitting: Family Medicine

## 2015-07-14 VITALS — BP 112/72 | HR 66 | Wt 171.0 lb

## 2015-07-14 DIAGNOSIS — M9902 Segmental and somatic dysfunction of thoracic region: Secondary | ICD-10-CM

## 2015-07-14 DIAGNOSIS — M940 Chondrocostal junction syndrome [Tietze]: Secondary | ICD-10-CM

## 2015-07-14 DIAGNOSIS — M7552 Bursitis of left shoulder: Secondary | ICD-10-CM

## 2015-07-14 DIAGNOSIS — M9901 Segmental and somatic dysfunction of cervical region: Secondary | ICD-10-CM | POA: Diagnosis not present

## 2015-07-14 DIAGNOSIS — M9908 Segmental and somatic dysfunction of rib cage: Secondary | ICD-10-CM

## 2015-07-14 DIAGNOSIS — M999 Biomechanical lesion, unspecified: Secondary | ICD-10-CM

## 2015-07-14 NOTE — Patient Instructions (Signed)
Keep watching the shoulder Ice is your friend I like what you are dong but slow and sweet  On the weight See me again in 4-6 weeks for manipulation

## 2015-07-14 NOTE — Assessment & Plan Note (Signed)
Patient overall encouraged to work on the posterolateral bit more. We discussed the importance of scapula stabilization techniques. Patient will try to do is more regularly. We discussed with patient about trying possible more machine surrounding [employer] temp working out. Patient will come back and see me again in 4-6 weeks for further evaluation and treatment.

## 2015-07-14 NOTE — Assessment & Plan Note (Signed)
Seems improved to continue to monitor. Some mild increase in labral pathology. If worsening symptoms advance imaging would be warranted. Encourage her to continue home exercises.

## 2015-07-14 NOTE — Progress Notes (Signed)
[name] [place], [place] [address] Phone: [phone] Subjective:    I'm seeing this patient by the request  of:  MCGOWEN,PHILIP H, MD   CC: Bilateral shoulder pain follow-up  [id number] Madeline Murphy is a 40 y.o. female coming in with complaint of right-sided shoulder pain. Patient was seen for rotator cuff tear on the right side that has completely resolved. Patient at last exam was having more of a shoulder bursitis and left shoulder. Elected to have an injection. Was to do home exercises and icing. Patient states significantly better overall. Still has some tightness. When she does certain activities and crossed that she has worsening pain. States that rest she seems to be doing well and can sleep on that side without any significant pain.   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 Continues to have some tightness intermittently. Thinks it could be stress related. Nose when her shoulder seems to hurt she changes how she does certain activities. Not stopping her from activities but can be uncomfortable.   Past Medical History  Diagnosis Date  . Bronchitis 01/30/2011  . Vaginitis 06/02/2011    recurrent vulvovaginal candidiasis; hx of allergy/intolerance to OTC topical antifungal treatments.  . Sinusitis 02/19/2012  . Strep pharyngitis 02/19/2012  . Overweight(278.02) 06/02/2012  . Other and unspecified hyperlipidemia 09/12/2012  . Insomnia 09/12/2012  . RLS (restless legs syndrome) 09/12/2012  . Depression with anxiety 09/12/2012    With some PMS components  . Right arm pain 08/03/2014  . Right shoulder pain 08/03/2014   Past Surgical History  Procedure Laterality Date  . Chronc compartmenal syndrome      S/P ortho surgery x3   Social History  Substance Use Topics  . Smoking status: Never Smoker   . Smokeless tobacco: Never Used  . Alcohol Use: Yes   Allergies  Allergen Reactions  .  Codeine Nausea Only    Syncope   . Melatonin Anxiety   Family History  Problem Relation Age of Onset  . Coronary artery disease    . Hypertension    . Hyperlipidemia Mother   . Heart disease Mother     palpitation  . Hyperlipidemia Father   . Hypertension Father   . Heart disease Father     s/p bypass and stent, passed from MI 06/24/14  . Otitis media Daughter     recurrent strep  . Cancer Maternal Aunt     breast  . Cancer Maternal Grandfather     possibly stomach  . Aneurysm Paternal Grandmother     likely brain  . Cancer Paternal Grandmother     breast  . Otitis media Daughter         Past medical history, social, surgical and family history all reviewed and no pertinent info pertaining to chief complaint. All other was reviewed using the EMR.   Review of Systems: No headache, visual changes, nausea, vomiting, diarrhea, constipation, dizziness, abdominal pain, skin rash, fevers, chills, night sweats, weight loss, swollen lymph nodes, body aches, joint swelling, muscle aches, chest pain, shortness of breath, mood changes.   Objective Blood pressure 112/72, pulse 66, weight 171 lb (77.565 kg).  General: No apparent distress alert and oriented x3 mood and affect normal, dressed appropriately.  HEENT: Pupils equal, extraocular movements intact  Respiratory: Patient's speak in full sentences and does not appear short of breath  Cardiovascular: No lower extremity edema, non tender, no  erythema  Skin: Warm dry intact with no signs of infection or rash on extremities or on axial skeleton.  Abdomen: Soft nontender  Neuro: Cranial nerves II through XII are intact, neurovascularly intact in all extremities with 2+ DTRs and 2+ pulses.  Lymph: No lymphadenopathy of posterior or anterior cervical chain or axillae bilaterally.  Gait normal with good balance and coordination.  MSK:  Non tender with full range of motion and good stability and symmetric strength and tone of , elbows,  wrist, hip, knee and ankles bilaterally.  Shoulder: left Inspection reveals no abnormalities, atrophy or asymmetry. Palpation is normal with no tenderness over AC joint or bicipital groove. ROM is full in all planes passively. Rotator cuff strength normal throughout. Mild impingement sign still remaining Speeds and Yergason's tests normal. Mild labral pathology. Normal scapular function observed. No painful arc and no drop arm sign. No apprehension sign     Osteopathic findings C2 flexed rotated and side bent left T3 extended rotated and side bent right T7 extended rotated and side bent leftwith inhaled rib         Impression and Recommendations:     This case required medical decision making of moderate complexity.

## 2015-07-14 NOTE — Assessment & Plan Note (Signed)
Decision today to treat with OMT was based on Physical Exam  After verbal consent patient was treated with HVLE, ME, FPR techniques in cervical, thoracic and rib areas  Patient tolerated the procedure well with improvement in symptoms  Patient given exercises, stretches and lifestyle modifications  See medications in patient instructions if given  Patient will follow up in 4-6 weeks

## 2015-07-16 ENCOUNTER — Telehealth: Payer: Self-pay | Admitting: Family Medicine

## 2015-07-16 LAB — WET PREP BY MOLECULAR PROBE
Candida species: NEGATIVE
GARDNERELLA VAGINALIS: NEGATIVE
TRICHOMONAS VAG: NEGATIVE

## 2015-07-16 NOTE — Telephone Encounter (Signed)
Patient left message to call her with results tried to return call got voice mail left another message.

## 2015-07-16 NOTE — Telephone Encounter (Signed)
Patient stopped by the office reviewed lab results and instructions with patient. Patient verbalized understanding.

## 2015-07-16 NOTE — Telephone Encounter (Signed)
Please call pt- her wet prep was normal. I suspect the colony count was low secondary to her treatment and home treatments. AS long as her symptoms are resolving we would not need to see her for followup. Make sure to continue the flagyl to completion.

## 2015-07-16 NOTE — Telephone Encounter (Signed)
Left message for patient to return call to review lab results and instructions. 

## 2015-08-02 ENCOUNTER — Encounter: Payer: Self-pay | Admitting: Family Medicine

## 2015-08-02 ENCOUNTER — Other Ambulatory Visit (INDEPENDENT_AMBULATORY_CARE_PROVIDER_SITE_OTHER): Payer: BLUE CROSS/BLUE SHIELD

## 2015-08-02 DIAGNOSIS — Z0000 Encounter for general adult medical examination without abnormal findings: Secondary | ICD-10-CM

## 2015-08-02 LAB — COMPREHENSIVE METABOLIC PANEL
ALT: 17 U/L (ref 0–35)
AST: 15 U/L (ref 0–37)
Albumin: 4 g/dL (ref 3.5–5.2)
Alkaline Phosphatase: 72 U/L (ref 39–117)
BILIRUBIN TOTAL: 0.4 mg/dL (ref 0.2–1.2)
BUN: 13 mg/dL (ref 6–23)
CALCIUM: 9.4 mg/dL (ref 8.4–10.5)
CO2: 28 meq/L (ref 19–32)
Chloride: 102 mEq/L (ref 96–112)
Creatinine, Ser: 0.82 mg/dL (ref 0.40–1.20)
GFR: 82.57 mL/min (ref >60.00)
Glucose, Bld: 81 mg/dL (ref 70–99)
Potassium: 4 mEq/L (ref 3.5–5.1)
Sodium: 139 mEq/L (ref 135–145)
TOTAL PROTEIN: 7.2 g/dL (ref 6.0–8.3)

## 2015-08-02 LAB — LIPID PANEL
Cholesterol: 222 mg/dL — ABNORMAL HIGH (ref 0–200)
HDL: 57.3 mg/dL (ref >39.00)
LDL CALC: 136 mg/dL — AB (ref 0–99)
NONHDL: 164.6
Total CHOL/HDL Ratio: 4
Triglycerides: 144 mg/dL (ref 0.0–149.0)
VLDL: 28.8 mg/dL (ref 0.0–40.0)

## 2015-08-02 LAB — CBC WITH DIFFERENTIAL/PLATELET
BASOS ABS: 0.1 10*3/uL (ref 0.0–0.1)
Basophils Relative: 0.9 % (ref 0.0–3.0)
Eosinophils Absolute: 0.1 10*3/uL (ref 0.0–0.7)
Eosinophils Relative: 1.5 % (ref 0.0–5.0)
HEMATOCRIT: 41.8 % (ref 36.0–46.0)
HEMOGLOBIN: 13.7 g/dL (ref 12.0–15.0)
LYMPHS ABS: 2.3 10*3/uL (ref 0.7–4.0)
LYMPHS PCT: 29.4 % (ref 12.0–46.0)
MCHC: 32.7 g/dL (ref 30.0–36.0)
MCV: 80.5 fl (ref 78.0–100.0)
MONOS PCT: 8 % (ref 3.0–12.0)
Monocytes Absolute: 0.6 10*3/uL (ref 0.1–1.0)
NEUTROS PCT: 60.2 % (ref 43.0–77.0)
Neutro Abs: 4.8 10*3/uL (ref 1.4–7.7)
Platelets: 302 10*3/uL (ref 150.0–400.0)
RBC: 5.19 Mil/uL — AB (ref 3.87–5.11)
RDW: 13.8 % (ref 11.5–15.5)
WBC: 7.9 10*3/uL (ref 4.0–10.5)

## 2015-08-02 LAB — TSH: TSH: 1.39 u[IU]/mL (ref 0.35–4.50)

## 2015-08-03 ENCOUNTER — Encounter: Payer: BLUE CROSS/BLUE SHIELD | Admitting: Family Medicine

## 2015-08-04 ENCOUNTER — Encounter: Payer: Self-pay | Admitting: Family Medicine

## 2015-08-04 ENCOUNTER — Ambulatory Visit (INDEPENDENT_AMBULATORY_CARE_PROVIDER_SITE_OTHER): Payer: BLUE CROSS/BLUE SHIELD | Admitting: Family Medicine

## 2015-08-04 VITALS — BP 114/79 | HR 56 | Temp 97.9°F | Resp 16 | Ht 63.5 in | Wt 170.5 lb

## 2015-08-04 DIAGNOSIS — Z0000 Encounter for general adult medical examination without abnormal findings: Secondary | ICD-10-CM | POA: Diagnosis not present

## 2015-08-04 NOTE — Progress Notes (Signed)
Office Note 08/04/2015  CC:  Chief Complaint  Patient presents with  . Annual Exam    Pt is fasting.     HPI:  Madeline Murphy is a 40 y.o. White female who is here for annual health maintenance exam. Pt has plans to do GYN f/u August this year.  Last pap wnl 2015 or 2016. Exercises regularly. Dental visits UTD. Dieting last few months.   Past Medical History  Diagnosis Date  . Vaginitis 06/02/2011    recurrent vulvovaginal candidiasis; hx of allergy/intolerance to OTC topical antifungal treatments.  . Sinusitis 02/19/2012  . Overweight(278.02) 06/02/2012  . Other and unspecified hyperlipidemia 09/12/2012  . Insomnia 09/12/2012  . RLS (restless legs syndrome) 09/12/2012  . Depression with anxiety 09/12/2012    With some PMS components  . Right arm pain 08/03/2014  . Right shoulder pain 08/03/2014    Past Surgical History  Procedure Laterality Date  . Chronc compartmenal syndrome      S/P ortho surgery x3    Family History  Problem Relation Age of Onset  . Coronary artery disease    . Hypertension    . Hyperlipidemia Mother   . Heart disease Mother     palpitation  . Hyperlipidemia Father   . Hypertension Father   . Heart disease Father     s/p bypass and stent, passed from MI 06/24/14  . Otitis media Daughter     recurrent strep  . Cancer Maternal Aunt     breast  . Cancer Maternal Grandfather     possibly stomach  . Aneurysm Paternal Grandmother     likely brain  . Cancer Paternal Grandmother     breast  . Otitis media Daughter     Social History   Social History  . Marital Status: Married    Spouse Name: N/A  . Number of Children: N/A  . Years of Education: N/A   Occupational History  . Not on file.   Social History Main Topics  . Smoking status: Never Smoker   . Smokeless tobacco: Never Used  . Alcohol Use: Yes  . Drug Use: No  . Sexual Activity: Yes    Birth Control/ Protection: Pill   Other Topics Concern  . Not on file   Social History  Narrative    Outpatient Prescriptions Prior to Visit  Medication Sig Dispense Refill  . ethynodiol-ethinyl estradiol (KELNOR,ZOVIA) 1-35 MG-MCG tablet Take 1 tablet by mouth daily.    Marland Kitchen ibuprofen (ADVIL,MOTRIN) 200 MG tablet Take 400 mg by mouth once as needed. For pain     . Multiple Vitamins-Minerals (MULTIVITAMIN PO) Take by mouth daily.    Marland Kitchen OVER THE COUNTER MEDICATION Take 2 capsules by mouth daily. PROBIOTIC 11    . rOPINIRole (REQUIP) 1 MG tablet TAKE 2 BY MOUTH AT BEDTIME 180 tablet 1  . zolpidem (AMBIEN) 10 MG tablet TAKE 1 TABLET BY MOUTH AT BEDTIME AS NEEDED. 30 tablet 2  . gabapentin (NEURONTIN) 100 MG capsule Take 1 capsule (100 mg total) by mouth at bedtime. (Patient not taking: Reported on 07/13/2015) 30 capsule 3  . meloxicam (MOBIC) 15 MG tablet Take 1 tablet (15 mg total) by mouth daily. (Patient not taking: Reported on 07/13/2015) 30 tablet 0  . metroNIDAZOLE (FLAGYL) 500 MG tablet Take 1 tablet (500 mg total) by mouth 2 (two) times daily. (Patient not taking: Reported on 08/04/2015) 14 tablet 0   No facility-administered medications prior to visit.    Allergies  Allergen Reactions  .  Codeine Nausea Only    Syncope   . Melatonin Anxiety    ROS Review of Systems  Constitutional: Negative for fever, chills, appetite change and fatigue.  HENT: Negative for congestion, dental problem, ear pain and sore throat.   Eyes: Negative for discharge, redness and visual disturbance.  Respiratory: Negative for cough, chest tightness, shortness of breath and wheezing.   Cardiovascular: Negative for chest pain, palpitations and leg swelling.  Gastrointestinal: Negative for nausea, vomiting, abdominal pain, diarrhea and blood in stool.  Genitourinary: Negative for dysuria, urgency, frequency, hematuria, flank pain and difficulty urinating.  Musculoskeletal: Negative for myalgias, back pain, joint swelling, arthralgias and neck stiffness.  Skin: Negative for pallor and rash.   Neurological: Negative for dizziness, speech difficulty, weakness and headaches.  Hematological: Negative for adenopathy. Does not bruise/bleed easily.  Psychiatric/Behavioral: Negative for confusion and sleep disturbance. The patient is not nervous/anxious.     PE; Blood pressure 114/79, pulse 56, temperature 97.9 F (36.6 C), temperature source Oral, resp. rate 16, height 5' 3.5" (1.613 m), weight 170 lb 8 oz (77.338 kg), last menstrual period 07/27/2015, SpO2 96 %. Gen: Alert, well appearing.  Patient is oriented to person, place, time, and situation. AFFECT: pleasant, lucid thought and speech. ENT: Ears: EACs clear, normal epithelium.  TMs with good light reflex and landmarks bilaterally.  Eyes: no injection, icteris, swelling, or exudate.  EOMI, PERRLA. Nose: no drainage or turbinate edema/swelling.  No injection or focal lesion.  Mouth: lips without lesion/swelling.  Oral mucosa pink and moist.  Dentition intact and without obvious caries or gingival swelling.  Oropharynx without erythema, exudate, or swelling.  Neck: supple/nontender.  No LAD, mass, or TM.  Carotid pulses 2+ bilaterally, without bruits. CV: RRR, no m/r/g.   LUNGS: CTA bilat, nonlabored resps, good aeration in all lung fields. ABD: soft, NT, ND, BS normal.  No hepatospenomegaly or mass.  No bruits. EXT: no clubbing, cyanosis, or edema.  Musculoskeletal: no joint swelling, erythema, warmth, or tenderness.  ROM of all joints intact. Skin - no sores or suspicious lesions or rashes or color changes   Pertinent labs:  Lab Results  Component Value Date   TSH 1.39 08/02/2015   Lab Results  Component Value Date   WBC 7.9 08/02/2015   HGB 13.7 08/02/2015   HCT 41.8 08/02/2015   MCV 80.5 08/02/2015   PLT 302.0 08/02/2015   Lab Results  Component Value Date   CREATININE 0.82 08/02/2015   BUN 13 08/02/2015   NA 139 08/02/2015   K 4.0 08/02/2015   CL 102 08/02/2015   CO2 28 08/02/2015   Lab Results  Component  Value Date   ALT 17 08/02/2015   AST 15 08/02/2015   ALKPHOS 72 08/02/2015   BILITOT 0.4 08/02/2015   Lab Results  Component Value Date   CHOL 222* 08/02/2015   Lab Results  Component Value Date   HDL 57.30 08/02/2015   Lab Results  Component Value Date   LDLCALC 136* 08/02/2015   Lab Results  Component Value Date   TRIG 144.0 08/02/2015   Lab Results  Component Value Date   CHOLHDL 4 08/02/2015   ASSESSMENT AND PLAN:   Health maintenance exam: Reviewed age and gender appropriate health maintenance issues (prudent diet, regular exercise, health risks of tobacco and excessive alcohol, use of seatbelts, fire alarms in home, use of sunscreen).  Also reviewed age and gender appropriate health screening as well as vaccine recommendations. HP labs reviewed in detail with pt  today, all were good. She will have routine health maintenance f/u with her GYN in a couple months.  An After Visit Summary was printed and given to the patient.  FOLLOW UP:  Return in about 1 year (around 08/03/2016) for annual CPE (fasting).  Signed:  Crissie Sickles, MD           08/04/2015

## 2015-08-04 NOTE — Progress Notes (Signed)
Pre visit review using our clinic review tool, if applicable. No additional management support is needed unless otherwise documented below in the visit note. 

## 2015-08-18 ENCOUNTER — Ambulatory Visit: Payer: BLUE CROSS/BLUE SHIELD | Admitting: Family Medicine

## 2015-08-30 ENCOUNTER — Other Ambulatory Visit (HOSPITAL_COMMUNITY)
Admission: RE | Admit: 2015-08-30 | Discharge: 2015-08-30 | Disposition: A | Discharge status: Home / Self Care | Payer: BLUE CROSS/BLUE SHIELD | Source: Ambulatory Visit | Attending: Family Medicine | Admitting: Family Medicine

## 2015-08-30 ENCOUNTER — Ambulatory Visit (INDEPENDENT_AMBULATORY_CARE_PROVIDER_SITE_OTHER): Payer: BLUE CROSS/BLUE SHIELD | Admitting: Family Medicine

## 2015-08-30 ENCOUNTER — Encounter: Payer: Self-pay | Admitting: Family Medicine

## 2015-08-30 VITALS — BP 122/80 | HR 56 | Temp 98.9°F | Resp 20 | Wt 170.8 lb

## 2015-08-30 DIAGNOSIS — N760 Acute vaginitis: Secondary | ICD-10-CM | POA: Insufficient documentation

## 2015-08-30 DIAGNOSIS — N898 Other specified noninflammatory disorders of vagina: Secondary | ICD-10-CM

## 2015-08-30 DIAGNOSIS — L298 Other pruritus: Secondary | ICD-10-CM

## 2015-08-30 MED ORDER — FLUCONAZOLE 150 MG PO TABS: 150.0000 mg | ORAL_TABLET | Freq: Once | ORAL | Status: DC

## 2015-08-30 MED ORDER — CLINDAMYCIN HCL 300 MG PO CAPS: 300.0000 mg | ORAL_CAPSULE | Freq: Two times a day (BID) | ORAL | Status: DC

## 2015-08-30 NOTE — Patient Instructions (Signed)
Diflucan x2, clindamycin two times for 7 days. I will call with results once available.    Bacterial Vaginosis Bacterial vaginosis is a vaginal infection that occurs when the normal balance of bacteria in the vagina is disrupted. It results from an overgrowth of certain bacteria. This is the most common vaginal infection in women of childbearing age. Treatment is important to prevent complications, especially in pregnant women, as it can cause a premature delivery. CAUSES  Bacterial vaginosis is caused by an increase in harmful bacteria that are normally present in smaller amounts in the vagina. Several different kinds of bacteria can cause bacterial vaginosis. However, the reason that the condition develops is not fully understood. RISK FACTORS Certain activities or behaviors can put you at an increased risk of developing bacterial vaginosis, including:  Having a new sex partner or multiple sex partners.  Douching.  Using an intrauterine device (IUD) for contraception. Women do not get bacterial vaginosis from toilet seats, bedding, swimming pools, or contact with objects around them. SIGNS AND SYMPTOMS  Some women with bacterial vaginosis have no signs or symptoms. Common symptoms include:  Grey vaginal discharge.  A fishlike odor with discharge, especially after sexual intercourse.  Itching or burning of the vagina and vulva.  Burning or pain with urination. DIAGNOSIS  Your health care provider will take a medical history and examine the vagina for signs of bacterial vaginosis. A sample of vaginal fluid may be taken. Your health care provider will look at this sample under a microscope to check for bacteria and abnormal cells. A vaginal pH test may also be done.  TREATMENT  Bacterial vaginosis may be treated with antibiotic medicines. These may be given in the form of a pill or a vaginal cream. A second round of antibiotics may be prescribed if the condition comes back after  treatment. Because bacterial vaginosis increases your risk for sexually transmitted diseases, getting treated can help reduce your risk for chlamydia, gonorrhea, HIV, and herpes. HOME CARE INSTRUCTIONS   Only take over-the-counter or prescription medicines as directed by your health care provider.  If antibiotic medicine was prescribed, take it as directed. Make sure you finish it even if you start to feel better.  Tell all sexual partners that you have a vaginal infection. They should see their health care provider and be treated if they have problems, such as a mild rash or itching.  During treatment, it is important that you follow these instructions:  Avoid sexual activity or use condoms correctly.  Do not douche.  Avoid alcohol as directed by your health care provider.  Avoid breastfeeding as directed by your health care provider. SEEK MEDICAL CARE IF:   Your symptoms are not improving after 3 days of treatment.  You have increased discharge or pain.  You have a fever. MAKE SURE YOU:   Understand these instructions.  Will watch your condition.  Will get help right away if you are not doing well or get worse. FOR MORE INFORMATION  Centers for Disease Control and Prevention, Division of STD Prevention: [employer]raiser[employer]raud.[employer] American Sexual Health Association (ASHA): www.ashastd.org    This information is not intended to replace advice given to you by your health care provider. Make sure you discuss any questions you have with your health care provider.   Document Released: 01/30/2005 Document Revised: 02/20/2014 Document Reviewed: 09/11/2012 Elsevier Interactive Patient Education [employer].

## 2015-08-30 NOTE — Progress Notes (Signed)
Patient ID: Madeline Murphy, female   DOB: 1975-10-21, 40 y.o.   MRN: [id number]    Madeline Murphy , [birth year], 40 y.o., female MRN: [id number] Patient Care Team    Relationship Specialty Notifications Start End  Tammi Sou, MD PCP - General Family Medicine  01/22/15    Comment: [facility] too hard to get an appt  Olga Millers, MD Consulting Physician Obstetrics and Gynecology  07/29/14     CC: Vaginal irritation Subjective: Pt presents for an acute OV with complaints of vaginal irritation of 1 week duration. Patient states after her menstrual cycle she noticed itching and a mild odor. She is married and in a monogamous relationship, she has no concerns for STD. She denies abdominal pain, dysuria, fever, chills, diarrhea or constipation. She denies changes in soaps, detergents, use of bubble baths, scented female products etc.  She was treated for BV in May with Flaygl, with good response, but the medication made her feel very ill.  She states this occurred before a few years ago and she has to be treated with diflucan every week for a months.    Allergies  Allergen Reactions  . Codeine Nausea Only    Syncope   . Melatonin Anxiety   Social History  Substance Use Topics  . Smoking status: Never Smoker   . Smokeless tobacco: Never Used  . Alcohol Use: Yes   Past Medical History  Diagnosis Date  . Vaginitis 06/02/2011    recurrent vulvovaginal candidiasis; hx of allergy/intolerance to OTC topical antifungal treatments.  . Sinusitis 02/19/2012  . Overweight(278.02) 06/02/2012  . Other and unspecified hyperlipidemia 09/12/2012  . Insomnia 09/12/2012  . RLS (restless legs syndrome) 09/12/2012  . Depression with anxiety 09/12/2012    With some PMS components  . Right arm pain 08/03/2014  . Right shoulder pain 08/03/2014   Past Surgical History  Procedure Laterality Date  . Chronc compartmenal syndrome      S/P ortho surgery x3   Family History  Problem Relation Age of Onset  .  Coronary artery disease    . Hypertension    . Hyperlipidemia Mother   . Heart disease Mother     palpitation  . Hyperlipidemia Father   . Hypertension Father   . Heart disease Father     s/p bypass and stent, passed from MI 06/24/14  . Otitis media Daughter     recurrent strep  . Cancer Maternal Aunt     breast  . Cancer Maternal Grandfather     possibly stomach  . Aneurysm Paternal Grandmother     likely brain  . Cancer Paternal Grandmother     breast  . Otitis media Daughter      Medication List       This list is accurate as of: 08/30/15  2:38 PM.  Always use your most recent med list.               ethynodiol-ethinyl estradiol 1-35 MG-MCG tablet  Commonly known as:  KELNOR,ZOVIA  Take 1 tablet by mouth daily.     ibuprofen 200 MG tablet  Commonly known as:  ADVIL,MOTRIN  Take 400 mg by mouth once as needed. For pain     MULTIVITAMIN PO  Take by mouth daily.     OVER THE COUNTER MEDICATION  Take 2 capsules by mouth daily. PROBIOTIC 11     rOPINIRole 1 MG tablet  Commonly known as:  REQUIP  TAKE 2 BY MOUTH AT BEDTIME  zolpidem 10 MG tablet  Commonly known as:  AMBIEN  TAKE 1 TABLET BY MOUTH AT BEDTIME AS NEEDED.        No results found for this or any previous visit (from the past 24 hour(s)). No results found.   ROS: Negative, with the exception of above mentioned in HPI   Objective:  BP 122/80 mmHg  Pulse 56  Temp(Src) 98.9 F (37.2 C)  Resp 20  Wt 170 lb 12 oz (77.452 kg)  SpO2 97%  LMP 08/25/2015 Body mass index is 29.77 kg/(m^2). Gen: Afebrile. No acute distress. Nontoxic in appearance, well developed, well nourished.  HENT: AT. [place]. MMM Eyes:Pupils Equal Round Reactive to light, Extraocular movements intact,  Conjunctiva without redness, discharge or icterus. Abd: Soft. NTND. BS present. no Masses palpated. No rebound or guarding.  GYN:  External genitalia within normal limits, normal hair distribution, no lesions. Urethral meatus  normal, no lesions. Vaginal mucosa pink, moist, normal rugae, no lesions. No cystocele or rectocele. cervix without lesions, moderate mucous/green discharge. Bimanual exam revealed normal uterus.  No bladder/suprapubic fullness, masses or tenderness. No cervical motion tenderness. No adnexal fullness. Anus and perineum within normal limits, no lesions.  Assessment/Plan: Madeline Murphy is a 40 y.o. female present for acute OV for  1. Vaginal itching - will treat with diflucan and clinda, does not appear yeasty on exam. Mild irritation. - GC/Chlamydia Probe Amp - Cervicovaginal ancillary only - fluconazole (DIFLUCAN) 150 MG tablet; Take 1 tablet (150 mg total) by mouth once.  Dispense: 1 tablet; Refill: 0 - clindamycin (CLEOCIN) 300 MG capsule; Take 1 capsule (300 mg total) by mouth 2 (two) times daily.  Dispense: 14 capsule; Refill: 0 - F/u PRN   electronically signed by:  Howard Pouch, DO  [place]

## 2015-08-31 LAB — GC/CHLAMYDIA PROBE AMP
CT Probe RNA: NOT DETECTED
GC Probe RNA: NOT DETECTED

## 2015-08-31 LAB — CERVICOVAGINAL ANCILLARY ONLY: WET PREP (BD AFFIRM): NEGATIVE

## 2015-09-01 ENCOUNTER — Telehealth: Payer: Self-pay | Admitting: Family Medicine

## 2015-09-01 NOTE — Telephone Encounter (Signed)
Please call pt: - her tests are all normal.  - If she has a bacteria infection it is not culturing as such. I would advise her to finish abx if sheis seeing improvement.  - If she continues to have symptoms, I would encourage her to follow with gyn for further investigation. Avoid all perfumed/scented soaps, detergents, feminine products etc.

## 2015-09-01 NOTE — Telephone Encounter (Signed)
Spoke with patient reviewed results and instructions . Patient verbalized understanding. 

## 2015-10-08 ENCOUNTER — Other Ambulatory Visit: Payer: Self-pay | Admitting: Family Medicine

## 2015-10-08 MED ORDER — ROPINIROLE HCL 1 MG PO TABS
ORAL_TABLET | ORAL | 1 refills | Status: DC
Start: 2015-10-08 — End: 2015-10-11

## 2015-10-08 NOTE — Telephone Encounter (Signed)
[facility] OR  RF request for ropinirole LOV: 08/04/15 NOV: None Last Written: 05/06/15 #180 w/ 1RF  Please advise. Thanks.

## 2015-10-11 ENCOUNTER — Other Ambulatory Visit: Payer: Self-pay | Admitting: Family Medicine

## 2015-10-11 MED ORDER — ROPINIROLE HCL 1 MG PO TABS: ORAL_TABLET | ORAL | 1 refills | Status: DC

## 2015-10-25 ENCOUNTER — Telehealth: Payer: Self-pay | Admitting: Family Medicine

## 2015-10-25 ENCOUNTER — Encounter: Payer: Self-pay | Admitting: Family Medicine

## 2015-10-25 NOTE — Telephone Encounter (Signed)
Yes, sorry.  She states she is working out at [employer] 4 x per week very tough training.   She states everyone else doing the same program ( strict diet / exercise ) is losing 20 + lbs in just 12 weeks and she's been doing it for 24 weeks with no wt loss.

## 2015-10-25 NOTE — Telephone Encounter (Signed)
No, the ropinirole is not associated with any weight gain issues or difficulty losing weight. Is she exercising as well as dieting?

## 2015-10-25 NOTE — Telephone Encounter (Signed)
Pt wants to know if repinirole be the reason patient is not losing weight?  Pt states she has been on strict diet / nutrition program for 24 weeks with no success.  Can you please advise.   Does patient need appointment to discuss?

## 2015-10-27 NOTE — Telephone Encounter (Signed)
Her med list has an oral contraceptive pill on it.  These have been known to cause weight gain/difficulty losing weight. I reviewed her labs from June and all were normal.  Reassure pt and have her continue her great exercise and diet habits, give it more time.-thx

## 2015-10-27 NOTE — Telephone Encounter (Signed)
Patient aware.

## 2015-11-03 ENCOUNTER — Other Ambulatory Visit: Payer: Self-pay | Admitting: Obstetrics and Gynecology

## 2015-11-04 LAB — CYTOLOGY - PAP

## 2016-02-15 ENCOUNTER — Ambulatory Visit (INDEPENDENT_AMBULATORY_CARE_PROVIDER_SITE_OTHER): Payer: BLUE CROSS/BLUE SHIELD | Admitting: Family Medicine

## 2016-02-15 ENCOUNTER — Encounter: Payer: Self-pay | Admitting: Family Medicine

## 2016-02-15 VITALS — BP 122/78 | HR 70 | Temp 99.0°F | Resp 20 | Wt 168.5 lb

## 2016-02-15 DIAGNOSIS — J0100 Acute maxillary sinusitis, unspecified: Secondary | ICD-10-CM

## 2016-02-15 MED ORDER — HYDROCODONE-HOMATROPINE 5-1.5 MG/5ML PO SYRP: 5.0000 mL | ORAL_SOLUTION | Freq: Three times a day (TID) | ORAL | 0 refills | Status: DC | PRN

## 2016-02-15 MED ORDER — FLUCONAZOLE 150 MG PO TABS: 150.0000 mg | ORAL_TABLET | Freq: Once | ORAL | 0 refills | Status: AC

## 2016-02-15 MED ORDER — DOXYCYCLINE HYCLATE 100 MG PO TABS: 100.0000 mg | ORAL_TABLET | Freq: Two times a day (BID) | ORAL | 0 refills | Status: DC

## 2016-02-15 NOTE — Patient Instructions (Addendum)
Flonase, mucinex DM Doxycyline every 12 hours for 10 days.  Hycodan cough syrup (no refills).  Rest and hydrate.     Sinusitis, Adult Sinusitis is soreness and inflammation of your sinuses. Sinuses are hollow spaces in the bones around your face. They are located:  Around your eyes.  In the middle of your forehead.  Behind your nose.  In your cheekbones. Your sinuses and nasal passages are lined with a stringy fluid (mucus). Mucus normally drains out of your sinuses. When your nasal tissues get inflamed or swollen, the mucus can get trapped or blocked so air cannot flow through your sinuses. This lets bacteria, viruses, and funguses grow, and that leads to infection. Follow these instructions at home: Medicines  Take, use, or apply over-the-counter and prescription medicines only as told by your doctor. These may include nasal sprays.  If you were prescribed an antibiotic medicine, take it as told by your doctor. Do not stop taking the antibiotic even if you start to feel better. Hydrate and Humidify  Drink enough water to keep your pee (urine) clear or pale yellow.  Use a cool mist humidifier to keep the humidity level in your home above 50%.  Breathe in steam for 10-15 minutes, 3-4 times a day or as told by your doctor. You can do this in the bathroom while a hot shower is running.  Try not to spend time in cool or dry air. Rest  Rest as much as possible.  Sleep with your head raised (elevated).  Make sure to get enough sleep each night. General instructions  Put a warm, moist washcloth on your face 3-4 times a day or as told by your doctor. This will help with discomfort.  Wash your hands often with soap and water. If there is no soap and water, use hand sanitizer.  Do not smoke. Avoid being around people who are smoking (secondhand smoke).  Keep all follow-up visits as told by your doctor. This is important. Contact a doctor if:  You have a fever.  Your  symptoms get worse.  Your symptoms do not get better within 10 days. Get help right away if:  You have a very bad headache.  You cannot stop throwing up (vomiting).  You have pain or swelling around your face or eyes.  You have trouble seeing.  You feel confused.  Your neck is stiff.  You have trouble breathing. This information is not intended to replace advice given to you by your health care provider. Make sure you discuss any questions you have with your health care provider. Document Released: 07/19/2007 Document Revised: 09/26/2015 Document Reviewed: 11/25/2014 Elsevier Interactive Patient Education  2017 [employer].

## 2016-02-15 NOTE — Progress Notes (Signed)
Madeline Murphy , 01-10-1976, 41 y.o., female MRN: [id number] Patient Care Team    Relationship Specialty Notifications Start End  Tammi Sou, MD PCP - General Family Medicine  01/22/15    Comment: [facility] too hard to get an appt  Olga Millers, MD Consulting Physician Obstetrics and Gynecology  07/29/14     CC: cough Subjective: Pt presents for an acute OV with complaints of Cough of 1 week  duration.  Associated symptoms include cough, nasal drainage, sore throat, ear pressure and low-grade temperature. Pt feels symptoms are worsening over the last few days. [facility]. She has been using Mucinex to help with her symptoms. She has been around no sick contacts. She has not had her flu shot this year.  Allergies  Allergen Reactions  . Codeine Nausea Only    Syncope   . Melatonin Anxiety   Social History  Substance Use Topics  . Smoking status: Never Smoker  . Smokeless tobacco: Never Used  . Alcohol use Yes   Past Medical History:  Diagnosis Date  . Depression with anxiety 09/12/2012   With some PMS components  . Insomnia 09/12/2012  . Other and unspecified hyperlipidemia 09/12/2012  . Overweight(278.02) 06/02/2012  . Right arm pain 08/03/2014  . Right shoulder pain 08/03/2014  . RLS (restless legs syndrome) 09/12/2012  . Sinusitis 02/19/2012  . Vaginitis 06/02/2011   recurrent vulvovaginal candidiasis; hx of allergy/intolerance to OTC topical antifungal treatments.   Past Surgical History:  Procedure Laterality Date  . chronc compartmenal syndrome     S/P ortho surgery x3   Family History  Problem Relation Age of Onset  . Hyperlipidemia Mother   . Heart disease Mother     palpitation  . Hyperlipidemia Father   . Hypertension Father   . Heart disease Father     s/p bypass and stent, passed from MI 06/24/14  . Otitis media Daughter     recurrent strep  . Cancer Maternal Aunt     breast  . Cancer Maternal  Grandfather     possibly stomach  . Aneurysm Paternal Grandmother     likely brain  . Cancer Paternal Grandmother     breast  . Otitis media Daughter   . Coronary artery disease    . Hypertension     Allergies as of 02/15/2016      Reactions   Codeine Nausea Only   Syncope   Melatonin Anxiety      Medication List       Accurate as of 02/15/16  3:55 PM. Always use your most recent med list.          ibuprofen 200 MG tablet Commonly known as:  ADVIL,MOTRIN Take 400 mg by mouth once as needed. For pain   MULTIVITAMIN PO Take by mouth daily.   OVER THE COUNTER MEDICATION Take 2 capsules by mouth daily. PROBIOTIC 11   phentermine 30 MG capsule Take 30 mg by mouth daily.   rOPINIRole 1 MG tablet Commonly known as:  REQUIP TAKE 2 BY MOUTH AT BEDTIME   zolpidem 10 MG tablet Commonly known as:  AMBIEN TAKE 1 TABLET BY MOUTH AT BEDTIME AS NEEDED.       No results found for this or any previous visit (from the past 24 hour(s)). No results found.   ROS: Negative, with the exception of above mentioned in HPI   Objective:  BP 122/78 (BP Location: Left  Arm, Patient Position: Sitting, Cuff Size: Normal)   Pulse 70   Temp 99 F (37.2 C)   Resp 20   Wt 168 lb 8 oz (76.4 kg)   SpO2 97%   BMI 29.38 kg/m  Body mass index is 29.38 kg/m. Gen: Afebrile. No acute distress. Nontoxic in appearance, well developed, well nourished.  HENT: AT. [place]. Bilateral TM visualized With bilateral air-fluid levels. MMM, no oral lesions. Bilateral nares erythema, swelling and drainage. Throat without erythema or exudates. Cough, tender to palpation bilateral maxillary sinus. Eyes:Pupils Equal Round Reactive to light, Extraocular movements intact,  Conjunctiva without redness, discharge or icterus. Neck/lymp/endocrine: Supple, mild anterior cervical lymphadenopathy CV: RRR  Chest: CTAB, no wheeze or crackles. Good air movement, normal resp effort. Cough with deep  inspiration.  Assessment/Plan: Madeline Murphy is a 41 y.o. female present for acute OV for  Acute maxillary sinusitis, recurrence not specified - Rest, hydrate, Flonase, Mucinex, Flonase -Hycodan cough syrup prescribed, no refills -Doxycycline twice a day 10 days. - Follow-up when necessary, sooner if worsening or unresolving symptoms.  electronically signed by:  Howard Pouch, DO  [place]

## 2016-03-30 DIAGNOSIS — E559 Vitamin D deficiency, unspecified: Secondary | ICD-10-CM | POA: Insufficient documentation

## 2016-08-14 ENCOUNTER — Telehealth: Payer: Self-pay | Admitting: Family Medicine

## 2016-08-14 ENCOUNTER — Other Ambulatory Visit (HOSPITAL_COMMUNITY)
Admission: RE | Admit: 2016-08-14 | Discharge: 2016-08-14 | Disposition: A | Discharge status: Home / Self Care | Payer: BLUE CROSS/BLUE SHIELD | Source: Ambulatory Visit | Attending: Family Medicine | Admitting: Family Medicine

## 2016-08-14 ENCOUNTER — Ambulatory Visit (INDEPENDENT_AMBULATORY_CARE_PROVIDER_SITE_OTHER): Payer: BLUE CROSS/BLUE SHIELD | Admitting: Family Medicine

## 2016-08-14 DIAGNOSIS — N760 Acute vaginitis: Secondary | ICD-10-CM

## 2016-08-14 DIAGNOSIS — N898 Other specified noninflammatory disorders of vagina: Secondary | ICD-10-CM

## 2016-08-14 MED ORDER — FLUCONAZOLE 150 MG PO TABS: 150.0000 mg | ORAL_TABLET | ORAL | 0 refills | Status: DC

## 2016-08-14 NOTE — Progress Notes (Signed)
Subjective:  I acted as a Education administrator for Dr. Charlett Blake. Madeline Murphy, [place]  Patient ID: Madeline Murphy, female    DOB: Mar 11, 1975, 41 y.o.   MRN: [id number]  No chief complaint on file.   HPI  Patient is in today for an acute visit. Patient c/o possible yeast infection. She describes significant vaginal irritation, redness and itching. She spent the weekend in very hot weather watching her daughter's lacrosse and feels she had an overgrowth as a result. She denies any significant discharge except after trying a dose of Monostat suppository once. No abdominal or back pain. Denies CP/palp/SOB/HA/congestion/fevers/GI or urinary symptoms. Taking meds as prescribed  Patient Care Team: Tammi Sou, MD as PCP - General (Family Medicine) Olga Millers, MD as Consulting Physician (Obstetrics and Gynecology)   Past Medical History:  Diagnosis Date  . Depression with anxiety 09/12/2012   With some PMS components  . Insomnia 09/12/2012  . Other and unspecified hyperlipidemia 09/12/2012  . Overweight(278.02) 06/02/2012  . Right arm pain 08/03/2014  . Right shoulder pain 08/03/2014  . RLS (restless legs syndrome) 09/12/2012  . Sinusitis 02/19/2012  . Vaginitis 06/02/2011   recurrent vulvovaginal candidiasis; hx of allergy/intolerance to OTC topical antifungal treatments.    Past Surgical History:  Procedure Laterality Date  . chronc compartmenal syndrome     S/P ortho surgery x3    Family History  Problem Relation Age of Onset  . Hyperlipidemia Mother   . Heart disease Mother        palpitation  . Hyperlipidemia Father   . Hypertension Father   . Heart disease Father        s/p bypass and stent, passed from MI 06/24/14  . Otitis media Daughter        recurrent strep  . Cancer Maternal Aunt        breast  . Cancer Maternal Grandfather        possibly stomach  . Aneurysm Paternal Grandmother        likely brain  . Cancer Paternal Grandmother        breast  . Otitis media Daughter   . Coronary  artery disease Unknown   . Hypertension Unknown     Social History   Social History  . Marital status: Married    Spouse name: N/A  . Number of children: N/A  . Years of education: N/A   Occupational History  . Not on file.   Social History Main Topics  . Smoking status: Never Smoker  . Smokeless tobacco: Never Used  . Alcohol use Yes  . Drug use: No  . Sexual activity: Yes    Birth control/ protection: Pill   Other Topics Concern  . Not on file   Social History Narrative  . No narrative on file    Outpatient Medications Prior to Visit  Medication Sig Dispense Refill  . ibuprofen (ADVIL,MOTRIN) 200 MG tablet Take 400 mg by mouth once as needed. For pain     . Multiple Vitamins-Minerals (MULTIVITAMIN PO) Take by mouth daily.    Marland Kitchen rOPINIRole (REQUIP) 1 MG tablet TAKE 2 BY MOUTH AT BEDTIME 180 tablet 1  . doxycycline (VIBRA-TABS) 100 MG tablet Take 1 tablet (100 mg total) by mouth 2 (two) times daily. 20 tablet 0  . HYDROcodone-homatropine (HYCODAN) 5-1.5 MG/5ML syrup Take 5 mLs by mouth every 8 (eight) hours as needed for cough. 120 mL 0  . OVER THE COUNTER MEDICATION Take 2 capsules by mouth daily. PROBIOTIC 11    .  phentermine 30 MG capsule Take 30 mg by mouth daily.  0  . zolpidem (AMBIEN) 10 MG tablet TAKE 1 TABLET BY MOUTH AT BEDTIME AS NEEDED. (Patient not taking: Reported on 02/15/2016) 30 tablet 2   No facility-administered medications prior to visit.     Allergies  Allergen Reactions  . Oxycodone-Acetaminophen Nausea And Vomiting  . Codeine Nausea Only and Nausea And Vomiting    Syncope Syncope   . Melatonin Anxiety    Review of Systems  Constitutional: Negative for fever and malaise/fatigue.  HENT: Negative for congestion.   Eyes: Negative for blurred vision.  Respiratory: Negative for shortness of breath.   Cardiovascular: Negative for chest pain, palpitations and leg swelling.  Gastrointestinal: Negative for abdominal pain, blood in stool, diarrhea  and nausea.  Genitourinary: Negative for dysuria, flank pain, frequency, hematuria and urgency.  Musculoskeletal: Negative for falls.  Skin: Negative for rash.  Neurological: Negative for dizziness, loss of consciousness and headaches.  Endo/Heme/Allergies: Negative for environmental allergies.  Psychiatric/Behavioral: Negative for depression. The patient is not nervous/anxious.        Objective:    Physical Exam  Constitutional: She is oriented to person, place, and time. She appears well-developed and well-nourished. No distress.  HENT:  Head: Normocephalic and atraumatic.  Nose: Nose normal.  Eyes: Right eye exhibits no discharge. Left eye exhibits no discharge.  Neck: Normal range of motion. Neck supple.  Cardiovascular: Normal rate and regular rhythm.   No murmur heard. Pulmonary/Chest: Effort normal and breath sounds normal.  Abdominal: Soft. Bowel sounds are normal. There is no tenderness.  Musculoskeletal: She exhibits no edema.  Neurological: She is alert and oriented to person, place, and time.  Skin: Skin is warm and dry.  Psychiatric: She has a normal mood and affect.  Nursing note and vitals reviewed.   There were no vitals taken for this visit. Wt Readings from Last 3 Encounters:  02/15/16 168 lb 8 oz (76.4 kg)  08/30/15 170 lb 12 oz (77.5 kg)  08/04/15 170 lb 8 oz (77.3 kg)   BP Readings from Last 3 Encounters:  02/15/16 122/78  08/30/15 122/80  08/04/15 114/79     Immunization History  Administered Date(s) Administered  . Influenza Whole 11/14/2010  . Influenza,inj,Quad PF,36+ Mos 12/25/2014  . Influenza-Unspecified 12/01/2013, 11/14/2015  . Tdap 09/10/2012    Health Maintenance  Topic Date Due  . HIV Screening  10/08/1991  . INFLUENZA VACCINE  09/13/2016  . PAP SMEAR  11/03/2018  . TETANUS/TDAP  09/11/2022    Lab Results  Component Value Date   WBC 7.9 08/02/2015   HGB 13.7 08/02/2015   HCT 41.8 08/02/2015   PLT 302.0 08/02/2015    GLUCOSE 81 08/02/2015   CHOL 222 (H) 08/02/2015   TRIG 144.0 08/02/2015   HDL 57.30 08/02/2015   LDLDIRECT 98.0 07/31/2014   LDLCALC 136 (H) 08/02/2015   ALT 17 08/02/2015   AST 15 08/02/2015   NA 139 08/02/2015   K 4.0 08/02/2015   CL 102 08/02/2015   CREATININE 0.82 08/02/2015   BUN 13 08/02/2015   CO2 28 08/02/2015   TSH 1.39 08/02/2015    Lab Results  Component Value Date   TSH 1.39 08/02/2015   Lab Results  Component Value Date   WBC 7.9 08/02/2015   HGB 13.7 08/02/2015   HCT 41.8 08/02/2015   MCV 80.5 08/02/2015   PLT 302.0 08/02/2015   Lab Results  Component Value Date   NA 139 08/02/2015  K 4.0 08/02/2015   CO2 28 08/02/2015   GLUCOSE 81 08/02/2015   BUN 13 08/02/2015   CREATININE 0.82 08/02/2015   BILITOT 0.4 08/02/2015   ALKPHOS 72 08/02/2015   AST 15 08/02/2015   ALT 17 08/02/2015   PROT 7.2 08/02/2015   ALBUMIN 4.0 08/02/2015   CALCIUM 9.4 08/02/2015   GFR 82.57 08/02/2015   Lab Results  Component Value Date   CHOL 222 (H) 08/02/2015   Lab Results  Component Value Date   HDL 57.30 08/02/2015   Lab Results  Component Value Date   LDLCALC 136 (H) 08/02/2015   Lab Results  Component Value Date   TRIG 144.0 08/02/2015   Lab Results  Component Value Date   CHOLHDL 4 08/02/2015   No results found for: HGBA1C       Assessment & Plan:   Problem List Items Addressed This Visit    Vaginal irritation    Likely candida due to excessive heat and exercise. Will check urine ancillary testing for yeast and BV, start Diflucan 150 mg tabs, 1 tab po weekly. Reminded cotton undergarments, hydrate, witch hazel prn. Probiotics. Report if no improvement       Other Visit Diagnoses    Acute vaginitis    -  Primary   Relevant Orders   Urine cytology ancillary only      I have discontinued Ms. Pangelinan's OVER THE COUNTER MEDICATION, zolpidem, phentermine, HYDROcodone-homatropine, and doxycycline. I am also having her start on fluconazole.  Additionally, I am having her maintain her ibuprofen, Multiple Vitamins-Minerals (MULTIVITAMIN PO), rOPINIRole, b complex vitamins, Vitamin D, and QSYMIA.  Meds ordered this encounter  Medications  . b complex vitamins tablet    Sig: Take 1 tablet by mouth daily.  . Cholecalciferol (VITAMIN D) 2000 units CAPS    Sig: Take 1 tablet by mouth daily.  Marland Kitchen QSYMIA 7.5-46 MG CP24    Sig: Take 1 capsule by mouth daily.    Refill:  1  . fluconazole (DIFLUCAN) 150 MG tablet    Sig: Take 1 tablet (150 mg total) by mouth once a week.    Dispense:  4 tablet    Refill:  0    CMA served as scribe during this visit. History, Physical and Plan performed by medical provider. Documentation and orders reviewed and attested to.  Penni Homans, MD

## 2016-08-14 NOTE — Assessment & Plan Note (Signed)
Likely candida due to excessive heat and exercise. Will check urine ancillary testing for yeast and BV, start Diflucan 150 mg tabs, 1 tab po weekly. Reminded cotton undergarments, hydrate, witch hazel prn. Probiotics. Report if no improvement

## 2016-08-14 NOTE — Patient Instructions (Addendum)
Sitz baths, blow dry, witch hazel, cotton undergarments, probiotics   Vaginitis Vaginitis is an inflammation of the vagina. It can happen when the normal bacteria and yeast in the vagina grow too much. There are different types. Treatment will depend on the type you have. Follow these instructions at home:  Take all medicines as told by your doctor.  Keep your vagina area clean and dry. Avoid soap. Rinse the area with water.  Avoid washing and cleaning out the vagina (douching).  Do not use tampons or have sex (intercourse) until your treatment is done.  Wipe from front to back after going to the restroom.  Wear cotton underwear.  Avoid wearing underwear while you sleep until your vaginitis is gone.  Avoid tight pants. Avoid underwear or nylons without a cotton panel.  Take off wet clothing (such as a bathing suit) as soon as you can.  Use mild, unscented products. Avoid fabric softeners and scented: ? Feminine sprays. ? Laundry detergents. ? Tampons. ? Soaps or bubble baths.  Practice safe sex and use condoms. Get help right away if:  You have belly (abdominal) pain.  You have a fever or lasting symptoms for more than 2-3 days.  You have a fever and your symptoms suddenly get worse. This information is not intended to replace advice given to you by your health care provider. Make sure you discuss any questions you have with your health care provider. Document Released: 04/28/2008 Document Revised: 07/08/2015 Document Reviewed: 07/13/2011 Elsevier Interactive Patient Education  2017 [employer].

## 2016-08-14 NOTE — Telephone Encounter (Signed)
Patient scheduled with another provider for treatment of yeast infection for today.

## 2016-08-14 NOTE — Telephone Encounter (Signed)
Patient request Rx for Diflucan. Please call her.

## 2016-08-15 ENCOUNTER — Ambulatory Visit: Payer: BLUE CROSS/BLUE SHIELD | Admitting: Family Medicine

## 2016-08-18 LAB — URINE CYTOLOGY ANCILLARY ONLY
BACTERIAL VAGINITIS: NEGATIVE
CANDIDA VAGINITIS: NEGATIVE

## 2016-11-29 ENCOUNTER — Ambulatory Visit: Payer: Self-pay

## 2016-11-29 ENCOUNTER — Encounter: Payer: Self-pay | Admitting: Family Medicine

## 2016-11-29 ENCOUNTER — Ambulatory Visit (INDEPENDENT_AMBULATORY_CARE_PROVIDER_SITE_OTHER): Payer: BLUE CROSS/BLUE SHIELD | Admitting: Family Medicine

## 2016-11-29 VITALS — BP 120/84 | HR 71 | Ht 63.0 in | Wt 166.0 lb

## 2016-11-29 DIAGNOSIS — M7050 Other bursitis of knee, unspecified knee: Secondary | ICD-10-CM

## 2016-11-29 DIAGNOSIS — M25561 Pain in right knee: Secondary | ICD-10-CM | POA: Diagnosis not present

## 2016-11-29 NOTE — Assessment & Plan Note (Signed)
Patient does have more of a pes anserine bursitis. Will do home exercises, icing regimen, patient told to do a compression brace. Given home exercises that I think will be very beneficial and work with Product/process development scientist. Patient will follow-up with me again in 4 weeks. Could be a candidate for nitroglycerin or potentially injections.

## 2016-11-29 NOTE — Progress Notes (Signed)
[facility] [place] Madeline Murphy, [place] [address] Phone: [phone] Subjective:     CC: Knee pain right  [id number]  Madeline Murphy is a 41 y.o. female coming in for right knee pain. She has been having this pain for 3 weeks and it started to hurt with running. Jumping rope and doing caricoa also causes her pain. She noticed yesterday lunges were bothering her but she has not been able to pinpoint a pattern to what bothers. Her pain is over the patellar tendon and medial knee. She has been using MG12 on it daily. When she has pain it is sharp initially and then the rest of the day she has a "throbbing" sensation in her knee. 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        Past Medical History:  Diagnosis Date  . Depression with anxiety 09/12/2012   With some PMS components  . Insomnia 09/12/2012  . Other and unspecified hyperlipidemia 09/12/2012  . Overweight(278.02) 06/02/2012  . Right arm pain 08/03/2014  . Right shoulder pain 08/03/2014  . RLS (restless legs syndrome) 09/12/2012  . Sinusitis 02/19/2012  . Vaginitis 06/02/2011   recurrent vulvovaginal candidiasis; hx of allergy/intolerance to OTC topical antifungal treatments.   Past Surgical History:  Procedure Laterality Date  . chronc compartmenal syndrome     S/P ortho surgery x3   Social History   Social History  . Marital status: Married    Spouse name: N/A  . Number of children: N/A  . Years of education: N/A   Social History Main Topics  . Smoking status: Never Smoker  . Smokeless tobacco: Never Used  . Alcohol use Yes  . Drug use: No  . Sexual activity: Yes    Birth control/ protection: Pill   Other Topics Concern  . None   Social History Narrative  . None   Allergies  Allergen Reactions  . Oxycodone-Acetaminophen Nausea And Vomiting  . Codeine Nausea Only and Nausea And Vomiting    Syncope Syncope   . Melatonin Anxiety   Family History    Problem Relation Age of Onset  . Hyperlipidemia Mother   . Heart disease Mother        palpitation  . Hyperlipidemia Father   . Hypertension Father   . Heart disease Father        s/p bypass and stent, passed from MI 06/24/14  . Otitis media Daughter        recurrent strep  . Cancer Maternal Aunt        breast  . Cancer Maternal Grandfather        possibly stomach  . Aneurysm Paternal Grandmother        likely brain  . Cancer Paternal Grandmother        breast  . Otitis media Daughter   . Coronary artery disease Unknown   . Hypertension Unknown      Past medical history, social, surgical and family history all reviewed in electronic medical record.  No pertanent information unless stated regarding to the chief complaint.   Review of Systems:Review of systems updated and as accurate as of 11/29/16  No headache, visual changes, nausea, vomiting, diarrhea, constipation, dizziness, abdominal pain, skin rash, fevers, chills, night sweats, weight loss, swollen lymph nodes, body aches, joint swelling, muscle aches, chest pain, shortness of breath, mood changes.   Objective  Blood pressure 120/84, pulse 71, height 5\' 3"  (1.6 m), weight  166 lb (75.3 kg), SpO2 98 %. Systems examined below as of 11/29/16   General: No apparent distress alert and oriented x3 mood and affect normal, dressed appropriately.  HEENT: Pupils equal, extraocular movements intact  Respiratory: Patient's speak in full sentences and does not appear short of breath  Cardiovascular: No lower extremity edema, non tender, no erythema  Skin: Warm dry intact with no signs of infection or rash on extremities or on axial skeleton.  Abdomen: Soft nontender  Neuro: Cranial nerves II through XII are intact, neurovascularly intact in all extremities with 2+ DTRs and 2+ pulses.  Lymph: No lymphadenopathy of posterior or anterior cervical chain or axillae bilaterally.  Gait normal with good balance and coordination.  MSK:  Non  tender with full range of motion and good stability and symmetric strength and tone of shoulders, elbows, wrist, hip and ankles bilaterally.  Knee: Right Normal to inspection with no erythema or effusion or obvious bony abnormalities. Tender to palpation over the pes anserine area but not as much over the joint line ROM full in flexion and extension and lower leg rotation. Ligaments with solid consistent endpoints including ACL, PCL, LCL, MCL. Negative Mcmurray's, Apley's, and Thessalonian tests. Mild to moderate painful patellar compression. Patellar glide with mild crepitus. Patellar and quadriceps tendons unremarkable. Hamstring and quadriceps strength is normal.  Contralateral knee unremarkable  MSK US performed of: Right knee This study was ordered, performed, and interpreted by Charlann Boxer D.O.  Knee: All structures visualized. Anteromedial, anterolateral, posteromedial, and posterolateral menisci unremarkable without tearing, fraying, effusion, or displacement. Patellar Tendon unremarkable on long and transverse views without effusion. No abnormality of prepatellar bursa. LCL and MCL unremarkable on long and transverse views. No abnormality of origin of medial or lateral head of the gastrocnemius. Presents urinary do some hypoechoic changes with increasing Doppler flow. No true tendon tear noted  IMPRESSION:  Pes anserine bursitis     Impression and Recommendations:     This case required medical decision making of moderate complexity.      Note: This dictation was prepared with Dragon dictation along with smaller phrase technology. Any transcriptional errors that result from this process are unintentional.

## 2016-11-29 NOTE — Patient Instructions (Signed)
Good to see you  Pes anserine bursitis pennsaid pinkie amount topically 2 times daily as needed.   Ice 20 minutes 2 times daily. Usually after activity and before bed. Wear compression sleeve on thigh  Body helix.com size Large or XL) Shorten stride Try to avoid high impact exercises  See me again in 3-4 weeks and I have a lot of tricks if needed

## 2016-12-14 DIAGNOSIS — M7050 Other bursitis of knee, unspecified knee: Secondary | ICD-10-CM

## 2016-12-14 HISTORY — DX: Other bursitis of knee, unspecified knee: M70.50

## 2016-12-19 ENCOUNTER — Other Ambulatory Visit: Payer: Self-pay | Admitting: Family Medicine

## 2016-12-19 NOTE — Telephone Encounter (Signed)
Will do 90 day supply with no RF---she is due for f/u ---either needs CPE or f/u appt for restless legs syndrome. Needs this before running out of the 90d supply of ropinirole.-thx

## 2016-12-19 NOTE — Telephone Encounter (Signed)
[facility]  RF request for ropinirole LOV: 08/04/15 Next ov: None Last written: 10/11/15 #180 w/ 1Rf  Please advise. Thanks.

## 2016-12-19 NOTE — Telephone Encounter (Signed)
Left message for pt to call back  °

## 2016-12-25 NOTE — Telephone Encounter (Signed)
Left message for pt to call back  °

## 2016-12-27 ENCOUNTER — Ambulatory Visit: Payer: Self-pay

## 2016-12-27 ENCOUNTER — Ambulatory Visit: Payer: BLUE CROSS/BLUE SHIELD | Admitting: Family Medicine

## 2016-12-27 ENCOUNTER — Encounter: Payer: Self-pay | Admitting: Family Medicine

## 2016-12-27 VITALS — BP 102/80 | HR 73 | Ht 64.0 in | Wt 163.0 lb

## 2016-12-27 DIAGNOSIS — M25561 Pain in right knee: Secondary | ICD-10-CM | POA: Diagnosis not present

## 2016-12-27 DIAGNOSIS — M7050 Other bursitis of knee, unspecified knee: Secondary | ICD-10-CM | POA: Diagnosis not present

## 2016-12-27 DIAGNOSIS — M25562 Pain in left knee: Secondary | ICD-10-CM

## 2016-12-27 NOTE — Patient Instructions (Signed)
Good to see you  We injected the pes anserine today and should help  Continue the thigh compression  Do the home exercises 3 times a week  OK to do legs next week but start 50%  See me again in 4 weeks.

## 2016-12-27 NOTE — Assessment & Plan Note (Signed)
Patient on exam today had no improvement.  Patient given an injection today.  Tolerated the procedure well.  We discussed a compression sleeve, home exercises and icing regimen.  We will refill the topical anti-inflammatories.  Worsening symptoms we will need to consider further imaging and potentially the knee but I think this is highly unlikely.

## 2016-12-27 NOTE — Progress Notes (Signed)
[name] [place], [place] [address] Phone: [phone] Subjective:    CC: Right knee pain follow-up  [id number]  Madeline Murphy is a 41 y.o. female coming in with complaint of right knee pain.  Patient was found to have more of a pes anserine bursitis. A little better. Still not able to do legs.  Patient states that if she does increase any activities he continues to have some discomfort.  Seems to be at the top of her hamstring as well as still on the medial aspect of the knee.       Past Medical History:  Diagnosis Date  . Depression with anxiety 09/12/2012   With some PMS components  . Insomnia 09/12/2012  . Other and unspecified hyperlipidemia 09/12/2012  . Overweight(278.02) 06/02/2012  . Right arm pain 08/03/2014  . Right shoulder pain 08/03/2014  . RLS (restless legs syndrome) 09/12/2012  . Sinusitis 02/19/2012  . Vaginitis 06/02/2011   recurrent vulvovaginal candidiasis; hx of allergy/intolerance to OTC topical antifungal treatments.   Past Surgical History:  Procedure Laterality Date  . chronc compartmenal syndrome     S/P ortho surgery x3   Social History   Socioeconomic History  . Marital status: Married    Spouse name: None  . Number of children: None  . Years of education: None  . Highest education level: None  Social Needs  . Financial resource strain: None  . Food insecurity - worry: None  . Food insecurity - inability: None  . Transportation needs - medical: None  . Transportation needs - non-medical: None  Occupational History  . None  Tobacco Use  . Smoking status: Never Smoker  . Smokeless tobacco: Never Used  Substance and Sexual Activity  . Alcohol use: Yes  . Drug use: No  . Sexual activity: Yes    Birth control/protection: Pill  Other Topics Concern  . None  Social History Narrative  . None   Allergies  Allergen Reactions  . Oxycodone-Acetaminophen Nausea And Vomiting  . Codeine Nausea Only  and Nausea And Vomiting    Syncope Syncope   . Melatonin Anxiety   Family History  Problem Relation Age of Onset  . Hyperlipidemia Mother   . Heart disease Mother        palpitation  . Hyperlipidemia Father   . Hypertension Father   . Heart disease Father        s/p bypass and stent, passed from MI 06/24/14  . Otitis media Daughter        recurrent strep  . Cancer Maternal Aunt        breast  . Cancer Maternal Grandfather        possibly stomach  . Aneurysm Paternal Grandmother        likely brain  . Cancer Paternal Grandmother        breast  . Otitis media Daughter   . Coronary artery disease Unknown   . Hypertension Unknown      Past medical history, social, surgical and family history all reviewed in electronic medical record.  No pertanent information unless stated regarding to the chief complaint.   Review of Systems:Review of systems updated and as accurate as of 12/27/16  No headache, visual changes, nausea, vomiting, diarrhea, constipation, dizziness, abdominal pain, skin rash, fevers, chills, night sweats, weight loss, swollen lymph nodes, body aches, joint swelling, chest pain, shortness of breath, mood changes.  Positive muscle aches  Objective  Blood pressure 102/80,  pulse 73, height 5\' 4"  (1.626 m), weight 163 lb (73.9 kg), SpO2 99 %. Systems examined below as of 12/27/16   General: No apparent distress alert and oriented x3 mood and affect normal, dressed appropriately.  HEENT: Pupils equal, extraocular movements intact  Respiratory: Patient's speak in full sentences and does not appear short of breath  Cardiovascular: No lower extremity edema, non tender, no erythema  Skin: Warm dry intact with no signs of infection or rash on extremities or on axial skeleton.  Abdomen: Soft nontender  Neuro: Cranial nerves II through XII are intact, neurovascularly intact in all extremities with 2+ DTRs and 2+ pulses.  Lymph: No lymphadenopathy of posterior or anterior  cervical chain or axillae bilaterally.  Gait normal with good balance and coordination.  MSK:  Non tender with full range of motion and good stability and symmetric strength and tone of shoulders, elbows, wrist, hip and ankles bilaterally.  Knee: Right Normal to inspection with no erythema or effusion or obvious bony abnormalities. Continued tenderness over the pes anserine  ROM full in flexion and extension and lower leg rotation. Ligaments with solid consistent endpoints including ACL, PCL, LCL, MCL. Negative Mcmurray's, Apley's, and Thessalonian tests. Non painful patellar compression. Patellar glide without crepitus. Patellar and quadriceps tendons unremarkable. Hamstring and quadriceps strength is normal. Contralateral knee unremarkable  Procedure: Real-time Ultrasound Guided Injection of pes anserine and insertion of hamstring.  Device: GE Logiq Q7 Ultrasound guided injection is preferred based studies that show increased duration, increased effect, greater accuracy, decreased procedural pain, increased response rate, and decreased cost with ultrasound guided versus blind injection.  Verbal informed consent obtained.  Time-out conducted.  Noted no overlying erythema, induration, or other signs of local infection.  Skin prepped in a sterile fashion.  Local anesthesia: Topical Ethyl chloride.  With sterile technique and under real time ultrasound guidance:   Completed without difficulty  Pain immediately resolved suggesting accurate placement of the medication.  Advised to call if fevers/chills, erythema, induration, drainage, or persistent bleeding.  Images permanently stored and available for review in the ultrasound unit.  Impression: Technically successful ultrasound guided injection.    Impression and Recommendations:     This case required medical decision making of moderate complexity.      Note: This dictation was prepared with Dragon dictation along with smaller  phrase technology. Any transcriptional errors that result from this process are unintentional.

## 2016-12-28 ENCOUNTER — Encounter: Payer: Self-pay | Admitting: *Deleted

## 2016-12-28 NOTE — Telephone Encounter (Signed)
Pt returned call, letter was retrieved from box and put in shred box. Pt advised that she will need an apt for either a follow up or physical before this Rx runs out. She stated that she will call back to schedule an apt.

## 2016-12-28 NOTE — Telephone Encounter (Signed)
Left message for pt to call back  °

## 2016-12-28 NOTE — Telephone Encounter (Signed)
Letter mailed to address in EMR. 

## 2017-01-02 ENCOUNTER — Encounter: Payer: Self-pay | Admitting: Family Medicine

## 2017-01-23 NOTE — Progress Notes (Deleted)
[name] [place], [place] [address] Phone: [phone] Subjective:    I'm seeing this patient by the request  of:    CC: Knee pain follow-up  [id number]  Madeline Murphy is a 41 y.o. female coming in with complaint of knee pain.  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  Also found to have a pedis anserine bursitis.  Given injection at last follow-up December 27, 2016.  Patient was to continue with a home exercise, diet compression, icing regimen and topical anti-inflammatories.  Patient states     Past Medical History:  Diagnosis Date  . Depression with anxiety 09/12/2012   With some PMS components  . Insomnia 09/12/2012  . Other and unspecified hyperlipidemia 09/12/2012  . Overweight(278.02) 06/02/2012  . Pes anserine bursitis 12/2016   Steroid injection by Dr. Tamala Julian 12/2016  . Right arm pain 08/03/2014  . Right shoulder pain 08/03/2014  . RLS (restless legs syndrome) 09/12/2012  . Sinusitis 02/19/2012  . Vaginitis 06/02/2011   recurrent vulvovaginal candidiasis; hx of allergy/intolerance to OTC topical antifungal treatments.   Past Surgical History:  Procedure Laterality Date  . chronc compartmenal syndrome     S/P ortho surgery x3   Social History   Socioeconomic History  . Marital status: Married    Spouse name: Not on file  . Number of children: Not on file  . Years of education: Not on file  . Highest education level: Not on file  Social Needs  . Financial resource strain: Not on file  . Food insecurity - worry: Not on file  . Food insecurity - inability: Not on file  . Transportation needs - medical: Not on file  . Transportation needs - non-medical: Not on file  Occupational History  . Not on file  Tobacco Use  . Smoking status: Never Smoker  . Smokeless tobacco: Never Used  Substance and Sexual Activity  . Alcohol use: Yes  . Drug use: No  . Sexual activity: Yes    Birth control/protection: Pill    Other Topics Concern  . Not on file  Social History Narrative  . Not on file   Allergies  Allergen Reactions  . Oxycodone-Acetaminophen Nausea And Vomiting  . Codeine Nausea Only and Nausea And Vomiting    Syncope Syncope   . Melatonin Anxiety   Family History  Problem Relation Age of Onset  . Hyperlipidemia Mother   . Heart disease Mother        palpitation  . Hyperlipidemia Father   . Hypertension Father   . Heart disease Father        s/p bypass and stent, passed from MI 06/24/14  . Otitis media Daughter        recurrent strep  . Cancer Maternal Aunt        breast  . Cancer Maternal Grandfather        possibly stomach  . Aneurysm Paternal Grandmother        likely brain  . Cancer Paternal Grandmother        breast  . Otitis media Daughter   . Coronary artery disease Unknown   . Hypertension Unknown      Past medical history, social, surgical and family history all reviewed in electronic medical record.  No pertanent information unless stated regarding to the chief complaint.   Review of Systems:Review of systems updated and as accurate as of 01/23/17  No headache, visual changes, nausea, vomiting, diarrhea, constipation,  dizziness, abdominal pain, skin rash, fevers, chills, night sweats, weight loss, swollen lymph nodes, body aches, joint swelling, muscle aches, chest pain, shortness of breath, mood changes.   Objective  There were no vitals taken for this visit. Systems examined below as of 01/23/17   General: No apparent distress alert and oriented x3 mood and affect normal, dressed appropriately.  HEENT: Pupils equal, extraocular movements intact  Respiratory: Patient's speak in full sentences and does not appear short of breath  Cardiovascular: No lower extremity edema, non tender, no erythema  Skin: Warm dry intact with no signs of infection or rash on extremities or on axial skeleton.  Abdomen: Soft nontender  Neuro: Cranial nerves II through XII are  intact, neurovascularly intact in all extremities with 2+ DTRs and 2+ pulses.  Lymph: No lymphadenopathy of posterior or anterior cervical chain or axillae bilaterally.  Gait normal with good balance and coordination.  MSK:  Non tender with full range of motion and good stability and symmetric strength and tone of shoulders, elbows, wrist, hip, knee and ankles bilaterally.     Impression and Recommendations:     This case required medical decision making of moderate complexity.      Note: This dictation was prepared with Dragon dictation along with smaller phrase technology. Any transcriptional errors that result from this process are unintentional.

## 2017-01-24 ENCOUNTER — Ambulatory Visit: Payer: BLUE CROSS/BLUE SHIELD | Admitting: Family Medicine

## 2017-03-21 ENCOUNTER — Other Ambulatory Visit: Payer: Self-pay

## 2017-03-21 MED ORDER — ROPINIROLE HCL 1 MG PO TABS: ORAL_TABLET | ORAL | 3 refills | Status: DC

## 2017-08-29 ENCOUNTER — Encounter: Payer: Self-pay | Admitting: Family Medicine

## 2017-08-29 ENCOUNTER — Ambulatory Visit (INDEPENDENT_AMBULATORY_CARE_PROVIDER_SITE_OTHER): Payer: Self-pay | Admitting: Family Medicine

## 2017-08-29 VITALS — BP 108/70 | HR 52 | Temp 98.3°F | Resp 16 | Ht 64.0 in | Wt 169.5 lb

## 2017-08-29 DIAGNOSIS — G2581 Restless legs syndrome: Secondary | ICD-10-CM

## 2017-08-29 DIAGNOSIS — N761 Subacute and chronic vaginitis: Secondary | ICD-10-CM

## 2017-08-29 MED ORDER — ROPINIROLE HCL 1 MG PO TABS: ORAL_TABLET | ORAL | 3 refills | Status: DC

## 2017-08-29 MED ORDER — FLUCONAZOLE 150 MG PO TABS: 150.0000 mg | ORAL_TABLET | Freq: Once | ORAL | 1 refills | Status: AC

## 2017-08-29 NOTE — Progress Notes (Signed)
OFFICE VISIT  08/29/2017   CC:  Chief Complaint  Patient presents with  . Follow-up    RCI  . Urinary Tract Infection    ?   HPI:    Patient is a 42 y.o. Caucasian female who presents for f/u restless legs syndrome. Doing well on 1 mg ropinirole : if she misses 1 dose she really has signif sx's. Has worse nighttime sx's if she is not active during the day.  Onset 2 wks ago, red and itchy around vaginal area, took one diflucan.  It has gradually dissipated. Never had any vag d/c or odor.  Lots of itching.  Put topical vagisil daily as well x 1 wk. Sx's are markedly improved but not gone.  Of note she works out and goes to work in workout clothes---chaffing very likely contributing. No urinary burning, no urinary urgency or frequency.      Past Medical History:  Diagnosis Date  . Depression with anxiety 09/12/2012   With some PMS components  . Insomnia 09/12/2012  . Other and unspecified hyperlipidemia 09/12/2012  . Overweight(278.02) 06/02/2012  . Pes anserine bursitis 12/2016   Steroid injection by Dr. Tamala Julian 12/2016  . Right arm pain 08/03/2014  . Right shoulder pain 08/03/2014  . RLS (restless legs syndrome) 09/12/2012  . Sinusitis 02/19/2012  . Vaginitis 06/02/2011   recurrent vulvovaginal candidiasis; hx of allergy/intolerance to OTC topical antifungal treatments.    Past Surgical History:  Procedure Laterality Date  . chronc compartmenal syndrome     S/P ortho surgery x3    Outpatient Medications Prior to Visit  Medication Sig Dispense Refill  . ibuprofen (ADVIL,MOTRIN) 200 MG tablet Take 400 mg by mouth once as needed. For pain     . levonorgestrel (MIRENA, 52 MG,) 20 MCG/24HR IUD Mirena 20 mcg/24 hours (5 yrs) 52 mg intrauterine device  Take 1 device by intrauterine route.    . Multiple Vitamins-Minerals (MULTIVITAMIN PO) Take by mouth daily.    Marland Kitchen rOPINIRole (REQUIP) 1 MG tablet TAKE 2 TABLETS BY MOUTH AT BEDTIME 180 tablet 3  . b complex vitamins tablet Take 1  tablet by mouth daily.    . Cholecalciferol (VITAMIN D) 2000 units CAPS Take 1 tablet by mouth daily.    . fluconazole (DIFLUCAN) 150 MG tablet Take 1 tablet (150 mg total) by mouth once a week. (Patient not taking: Reported on 12/27/2016) 4 tablet 0  . QSYMIA 7.5-46 MG CP24 Take 1 capsule by mouth daily.  1   No facility-administered medications prior to visit.     Allergies  Allergen Reactions  . Oxycodone-Acetaminophen Nausea And Vomiting  . Codeine Nausea Only and Nausea And Vomiting    Syncope Syncope   . Melatonin Anxiety    ROS As per HPI  PE: Blood pressure 108/70, pulse (!) 52, temperature 98.3 F (36.8 C), temperature source Oral, resp. rate 16, height 5\' 4"  (1.626 m), weight 169 lb 8 oz (76.9 kg), SpO2 100 %. Wt Readings from Last 2 Encounters:  08/29/17 169 lb 8 oz (76.9 kg)  12/27/16 163 lb (73.9 kg)    Gen: alert, oriented x 4, affect pleasant.  Lucid thinking and conversation noted. HEENT: PERRLA, EOMI.   NEURO: no tremor or tics noted on observation.  Coordination intact. CN 2-12 grossly intact bilaterally, strength 5/5 in all extremeties.  No ataxia. GU exam (Pt examined with Helayne Seminole, CMA, as chaperone):  No erythema, discharge, rash, bleeding, or swelling in vulvovaginal region).   LABS:  Chemistry      Component Value Date/Time   NA 139 08/02/2015 0823   K 4.0 08/02/2015 0823   CL 102 08/02/2015 0823   CO2 28 08/02/2015 0823   BUN 13 08/02/2015 0823   CREATININE 0.82 08/02/2015 0823   CREATININE 0.78 02/02/2011 1101      Component Value Date/Time   CALCIUM 9.4 08/02/2015 0823   ALKPHOS 72 08/02/2015 0823   AST 15 08/02/2015 0823   ALT 17 08/02/2015 0823   BILITOT 0.4 08/02/2015 0823       IMPRESSION AND PLAN:  1) RLS: doing well on 1-2 mg ropinirole qhs. Med rf'd today.  2) Vaginitis: suspect some component of yeast + irritation sx's from moisture and heat related to working out in tight clothes and then wearing same clothes  to work. Will give another 1 tab dose of diflucan 150mg  and she will need to get into the habit of working out in looser clothes and changing clothes after working out.  Work on keeping area [employer].  An After Visit Summary was printed and given to the patient.  FOLLOW UP: Return in about 6 months (around 03/01/2018) for annual CPE (fasting).  Signed:  Crissie Sickles, MD           08/29/2017

## 2018-02-15 ENCOUNTER — Ambulatory Visit: Payer: Self-pay

## 2018-02-15 MED ORDER — FLUCONAZOLE 150 MG PO TABS: 150.0000 mg | ORAL_TABLET | Freq: Once | ORAL | 0 refills | Status: AC

## 2018-02-15 NOTE — Telephone Encounter (Signed)
OK, pls eRx fluconazole 150mg , 1 tab po qd x 1 dose, #1, no RF.-thx

## 2018-02-15 NOTE — Telephone Encounter (Signed)
Please advise. Thanks.  

## 2018-02-15 NOTE — Telephone Encounter (Signed)
Rx sent.  Pt advised and voiced understanding.

## 2018-02-15 NOTE — Addendum Note (Signed)
Addended by: Onalee Hua on: 02/15/2018 03:42 PM   Modules accepted: Orders

## 2018-02-15 NOTE — Telephone Encounter (Signed)
Called pt.  Reported she started having vaginal itching without any discharge yesterday.  Reported she applied one application of Monistat cream about 7:00 PM, and within one hour or slightly longer, she developed redness and swelling both internally and externally.  Reported the area became tender.  Stated she took a shower, immediately, and washed the cream off, as best she could.  Stated the itching and redness is still present.  Reported swelling as being mild to moderate.  Denied any vaginal bleeding.  Denied any urinary symptoms.  Reported remembering previous reaction to Monistat.  Is requesting an Rx for Diflucan.  Reported she, and entire family have had upper respiratory virus for past week; afebrile this morning.  Stated she doesn't want to come out and expose other people to the virus.  Advised will send triage note to Dr. Anitra Lauth, and request further recommendations.  Care advice given per protocol.  Verb. Understanding.  Agreed with plan.         Reason for Disposition . [1] Vaginal itching AND [2] not improved > 3 days following CARE ADVICE    Reported external vaginal itching without any drainage; used one application of Monistat cream and within an hour or more had increased irritation with increased redness, tenderness and swelling of internal and external vaginal area.  Answer Assessment - Initial Assessment Questions 1. SYMPTOM: "What's the main symptom you're concerned about?" (e.g., pain, itching, dryness)    Vaginal redness and swelling after use of the Monistat cream x 1 application; reported mild to moderate swelling of the vaginal area.   2. LOCATION: "Where is the  symptom located?" (e.g., inside/outside, left/right)     Internal and external vagina 3. ONSET: "When did the  symptom  start?"     8:00 PM, after use of Monistat  4. PAIN: "Is there any pain?" If so, ask: "How bad is it?" (Scale: 1-10; mild, moderate, severe)     Not pain; feels tender/irritated  5. ITCHING:  "Is there any itching?" If so, ask: "How bad is it?" (Scale: 1-10; mild, moderate, severe)    Itching worsened after Monistat 6. CAUSE: "What do you think is causing the discharge?" "Have you had the same problem before? What happened then?"     No discharge;  7. OTHER SYMPTOMS: "Do you have any other symptoms?" (e.g., fever, itching, vaginal bleeding, pain with urination, injury to genital area, vaginal foreign body)     Has had upper resp. virus with low grade fever; normal temp. this AM ; denied vaginal bleeding; denied urinary sx's.      8. PREGNANCY: "Is there any chance you are pregnant?" "When was your last menstrual period?"     LMP; has an IUD; not always a monthly period; had spotting last month  Protocols used: VAGINAL [facility]  Message from Jodie Echevaria sent at 02/15/2018 10:05 AM EST   Patient called to request Rx for Diflucan called in to the pharmacy on her chart. Complain of vaginal itching and after using OTC Monistat vagina is now red and swollen. She stated that her whole house hold have been battling a virus and she have been using Sudafed and Mucinex not sure if this is what caused the vaginal itching. Patient states that she would prefer not to go into the office. Ph# [phone]

## 2018-05-21 ENCOUNTER — Encounter: Payer: Self-pay | Admitting: Family Medicine

## 2018-05-21 ENCOUNTER — Ambulatory Visit (INDEPENDENT_AMBULATORY_CARE_PROVIDER_SITE_OTHER): Payer: Self-pay | Admitting: Family Medicine

## 2018-05-21 VITALS — Temp 98.5°F | Ht 64.0 in | Wt 183.0 lb

## 2018-05-21 DIAGNOSIS — S29012A Strain of muscle and tendon of back wall of thorax, initial encounter: Secondary | ICD-10-CM

## 2018-05-21 DIAGNOSIS — M5412 Radiculopathy, cervical region: Secondary | ICD-10-CM

## 2018-05-21 MED ORDER — PREDNISONE 20 MG PO TABS: ORAL_TABLET | ORAL | 0 refills | Status: DC

## 2018-05-21 MED ORDER — NAPROXEN 500 MG PO TABS: 500.0000 mg | ORAL_TABLET | Freq: Two times a day (BID) | ORAL | 0 refills | Status: DC

## 2018-05-21 MED ORDER — CYCLOBENZAPRINE HCL 10 MG PO TABS: 10.0000 mg | ORAL_TABLET | Freq: Three times a day (TID) | ORAL | 0 refills | Status: DC | PRN

## 2018-05-21 NOTE — Patient Instructions (Signed)
I believe you have strained the muscles in your back and also pinched nerve. Start naproxen every 12 hours with food for pain and also an anti-inflammatory. Start prednisone taper daily per bottle instructions. Flexeril can be used up to every 8 hours as needed, would suggest you start this medication when you do not have to drive in case it makes you sedated.  Follow-up with your PCP in 1-2 weeks if not improved, seek immediate attention if worsening.   Cervical Radiculopathy  Cervical radiculopathy means that a nerve in the neck is pinched or bruised. This can cause pain or loss of feeling (numbness) that runs from your neck to your arm and fingers. Follow these instructions at home: Managing pain  Take over-the-counter and prescription medicines only as told by your doctor.  If directed, put ice on the injured or painful area. ? Put ice in a plastic bag. ? Place a towel between your skin and the bag. ? Leave the ice on for 20 minutes, 2-3 times per day.  If ice does not help, you can try using heat. Take a warm shower or warm bath, or use a heat pack as told by your doctor.  You may try a gentle neck and shoulder massage. Activity  Rest as needed. Follow instructions from your doctor about any activities to avoid.  Do exercises as told by your doctor or physical therapist. General instructions  If you were given a soft collar, wear it as told by your doctor.  Use a flat pillow when you sleep.  Keep all follow-up visits as told by your doctor. This is important. Contact a doctor if:  Your condition does not improve with treatment. Get help right away if:  Your pain gets worse and is not controlled with medicine.  You lose feeling or feel weak in your hand, arm, face, or leg.  You have a fever.  You have a stiff neck.  You cannot control when you poop or pee (have incontinence).  You have trouble with walking, balance, or talking. This information is not intended  to replace advice given to you by your health care provider. Make sure you discuss any questions you have with your health care provider. Document Released: 01/19/2011 Document Revised: 07/08/2015 Document Reviewed: 03/26/2014 Elsevier Interactive Patient Education  2019 [employer].

## 2018-05-21 NOTE — Progress Notes (Signed)
Virtual Visit via Video   I connected with [name] on 05/21/18 at  2:00 PM EDT by a video enabled telemedicine application and verified that I am speaking with the correct person using two identifiers. Location patient: Home Location provider: [facility], Office Persons participating in the virtual visit: Patient, Dr. Raoul Pitch and R.Baker, LPN  I discussed the limitations of evaluation and management by telemedicine and the availability of in person appointments. The patient expressed understanding and agreed to proceed.  Subjective:   Patient Care Team    Relationship Specialty Notifications Start End  McGowen, Adrian Blackwater, MD PCP - General Family Medicine  01/22/15    Comment: [facility] too hard to get an appt  Olga Millers, MD Consulting Physician Obstetrics and Gynecology  07/29/14   Lyndal Pulley, [place] Phys[facility]  12/10/16      Chief Complaint  Patient presents with  . Arm numbness    Left arm and hand is numb and tingling.  Started on Sunday and has gotten worse. She went to [facility], who stated it was a pinched nerve. Pt thinks it happened when doing push ups     HPI:  Madeline Murphy is a 43 y.o. female is in today with complaints of left arm and hand numbness and tingling that started 2 days ago.  Patient is new to this provider, PCP is Dr. Anitra Lauth.  Reports she was doing over 200 push-ups a day over the weekend and on Sunday she woke up with left arm and hand numbness and tingling.  Went to the chiropractor they told her she probably had a pinched nerve.  Dates she is having pain and "spasm" in her left scapular region radiating underneath her axilla and into her chest wall as well as her tricep muscle.  She reports she has tingling in her fingers and left hand.  She reports moving her neck seems to make the discomfort worse and using her arms.  She has been using Tylenol for discomfort, which has not been effective.  ROS: See  pertinent positives and negatives per HPI.  Patient Active Problem List   Diagnosis Date Noted  . Pes anserine bursitis 11/29/2016  . Health maintenance examination 08/04/2015  . Vaginal irritation 07/13/2015  . Urinary frequency 07/13/2015  . Vaginal discharge 07/13/2015  . Bursitis of left shoulder 06/09/2015  . 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 Nonallopathic lesion-rib cage 05/20/2015  . Nonallopathic lesion of cervical region 05/20/2015  . Nonallopathic lesion of thoracic region 05/20/2015  . Rotator cuff tear 01/12/2015  . Right shoulder pain 08/03/2014  . Back pain 07/21/2013  . Preventative health care 09/12/2012  . Mixed hyperlipidemia 09/12/2012  . Insomnia 09/12/2012  . RLS (restless legs syndrome) 09/12/2012  . Depression with anxiety 09/12/2012  . Overweight 06/02/2012  . 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 HEPATIC CYST 12/27/2006  . RENAL CYST 12/27/2006  . Migraine without aura 10/16/2006    Social History   Tobacco Use  . Smoking status: Never Smoker  . Smokeless tobacco: Never Used  Substance Use Topics  . Alcohol use: Yes    Current Outpatient Medications:  .  ibuprofen (ADVIL,MOTRIN) 200 MG tablet, Take 400 mg by mouth once as needed. For pain , Disp: , Rfl:  .  levonorgestrel (MIRENA, 52 MG,) 20 MCG/24HR IUD, Mirena 20 mcg/24 hours (5 yrs) 52 mg intrauterine device  Take 1 device by intrauterine route.,  Disp: , Rfl:  .  Multiple Vitamins-Minerals (MULTIVITAMIN PO), Take by mouth daily., Disp: , Rfl:  .  rOPINIRole (REQUIP) 1 MG tablet, TAKE 2 TABLETS BY MOUTH AT BEDTIME, Disp: 180 tablet, Rfl: 3  Allergies  Allergen Reactions  . Oxycodone-Acetaminophen Nausea And Vomiting  . Codeine Nausea Only and Nausea And Vomiting    Syncope Syncope   . Melatonin Anxiety    Objective:  Temp 98.5 F (36.9 C) (Temporal)   Ht 5\' 4"  (1.626 m)   Wt 183 lb (83 kg)   BMI 31.41 kg/m   Gen: No acute distress. Nontoxic in appearance. Pleasant caucasian female.  HENT: AT. [place].  MMM.  Eyes: Conjunctiva without redness, discharge or icterus. Chest: Cough or shortness of breath not present.  MSK: Comfort with neck and arm movements. Neuro: Normal gait. Alert. Oriented x3  Psych: Normal affect, dress and demeanor. Normal speech. Normal thought content and judgment.   Assessment and Plan:  Keiyana Stehr is a 43 y.o. female present for Strain of thoracic back region/Cervical radiculopathy -Patient's has symptoms of cervical radiculopathy and thoracic discomfort after performing a rather extensive workout this weekend. -Rest, ice for for 72 hours then heat. -Naproxen  twice daily with meals for at least 5-7 days. -Prednisone taper prescribed -Flexeril up to 3 times daily as needed, start at night, sedation precautions provided - cyclobenzaprine (FLEXERIL) 10 MG tablet; Take 1 tablet (10 mg total) by mouth 3 (three) times daily as needed for muscle spasms.  Dispense: 30 tablet; Refill: 0 - naproxen (NAPROSYN) 500 MG tablet; Take 1 tablet (500 mg total) by mouth 2 (two) times daily with a meal.  Dispense: 30 tablet; Refill: 0 Follow-up 1 to 2 weeks with PCP if not resolving, sooner if worsening  > 15 minutes spent with patient, >50% of time spent face to face    Howard Pouch, DO 05/21/2018

## 2018-05-31 ENCOUNTER — Ambulatory Visit (INDEPENDENT_AMBULATORY_CARE_PROVIDER_SITE_OTHER): Payer: Self-pay | Admitting: Family Medicine

## 2018-05-31 ENCOUNTER — Encounter: Payer: Self-pay | Admitting: Family Medicine

## 2018-05-31 VITALS — HR 72 | Temp 98.6°F | Ht 64.0 in | Wt 184.0 lb

## 2018-05-31 DIAGNOSIS — S29012A Strain of muscle and tendon of back wall of thorax, initial encounter: Secondary | ICD-10-CM

## 2018-05-31 DIAGNOSIS — S161XXD Strain of muscle, fascia and tendon at neck level, subsequent encounter: Secondary | ICD-10-CM

## 2018-05-31 DIAGNOSIS — M5410 Radiculopathy, site unspecified: Secondary | ICD-10-CM

## 2018-05-31 MED ORDER — TRAMADOL HCL 50 MG PO TABS: 50.0000 mg | ORAL_TABLET | Freq: Three times a day (TID) | ORAL | 0 refills | Status: AC | PRN

## 2018-05-31 MED ORDER — GABAPENTIN 100 MG PO CAPS: ORAL_CAPSULE | ORAL | 0 refills | Status: DC

## 2018-05-31 NOTE — Progress Notes (Signed)
Virtual Visit via Video   I connected with Madeline Murphy on 05/31/18 at  1:40 PM EDT by a video enabled telemedicine application and verified that I am speaking with the correct person using two identifiers. Location patient: Home Location provider: [facility], Office Persons participating in the virtual visit: Patient, Dr. Raoul Pitch and R.Baker, LPN  I discussed the limitations of evaluation and management by telemedicine and the availability of in person appointments. The patient expressed understanding and agreed to proceed.  Subjective:   Patient Care Team    Relationship Specialty Notifications Start End  McGowen, Adrian Blackwater, MD PCP - General Family Medicine  01/22/15    Comment: [facility] too hard to get an appt  Olga Millers, MD Consulting Physician Obstetrics and Gynecology  07/29/14   Lyndal Pulley, [place] Physician Sports Medicine  12/10/16      Chief Complaint  Patient presents with   pinched nerve    Pt states pinched nerve is not getting better, but no worse. Numbness in Left arm and hand. Pt continues to see Chiropractor. Pt is not sleeping and feels this is making her have anxiety. Finished prednisone yesterday.     HPI:  Madeline Murphy is a 43 y.o. female is in today for follow up on her  complaints of left arm and hand numbness and tingling that started 05/19/2018. Patient reports the pain is still present in her left trap and the left side of her neck.  Her chest wall pain is improved.  She did see a Restaurant manager, fast food.  He states it is very difficult to sleep she cannot lay on her left side or her right side.  SHe was treated with NSAIDs, prednisone and Flexeril.  Patient reports she has taken tramadol in the past and tolerated. Prior note:  Reports she was doing over 200 push-ups a day over the weekend and on Sunday she woke up with left arm and hand numbness and tingling.  Went to the chiropractor they told her she probably had a pinched nerve.  Dates  she is having pain and "spasm" in her left scapular region radiating underneath her axilla and into her chest wall as well as her tricep muscle.  She reports she has tingling in her fingers and left hand.  She reports moving her neck seems to make the discomfort worse and using her arms.  She has been using Tylenol for discomfort, which has not been effective.  ROS: See pertinent positives and negatives per HPI.  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   Nonallopathic lesion-rib cage 05/20/2015   Nonallopathic lesion of cervical region 05/20/2015   Nonallopathic lesion of thoracic region 05/20/2015   Rotator cuff tear 01/12/2015   Right shoulder pain 08/03/2014   Back pain 07/21/2013   Preventative health care 09/12/2012   Mixed hyperlipidemia 09/12/2012   Insomnia 09/12/2012   RLS (restless legs syndrome) 09/12/2012   Depression with anxiety 09/12/2012   Overweight 06/02/2012   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   CAVUS DEFORMITY OF FOOT, 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   HEPATIC CYST 12/27/2006   RENAL CYST 12/27/2006   Migraine without aura 10/16/2006    Social History   Tobacco Use   Smoking status: Never Smoker  Smokeless tobacco: Never Used  Substance Use Topics   Alcohol use: Yes    Current Outpatient Medications:    cyclobenzaprine (FLEXERIL) 10 MG tablet, Take 1 tablet (10 mg total) by mouth 3 (three) times daily as needed for muscle spasms., Disp: 30 tablet, Rfl: 0   levonorgestrel (MIRENA, 52 MG,) 20 MCG/24HR IUD, Mirena 20 mcg/24 hours (5 yrs) 52 mg intrauterine device  Take 1 device by intrauterine route., Disp: , Rfl:    Multiple Vitamins-Minerals (MULTIVITAMIN PO),  Take by mouth daily., Disp: , Rfl:    naproxen (NAPROSYN) 500 MG tablet, Take 1 tablet (500 mg total) by mouth 2 (two) times daily with a meal., Disp: 30 tablet, Rfl: 0   rOPINIRole (REQUIP) 1 MG tablet, TAKE 2 TABLETS BY MOUTH AT BEDTIME, Disp: 180 tablet, Rfl: 3   predniSONE (DELTASONE) 20 MG tablet, 60 mg x3d, 40 mg x3d, 20 mg x2d, 10 mg x2d (Patient not taking: Reported on 05/31/2018), Disp: 18 tablet, Rfl: 0  Allergies  Allergen Reactions   Oxycodone-Acetaminophen Nausea And Vomiting   Codeine Nausea Only and Nausea And Vomiting    Syncope Syncope    Melatonin Anxiety    Objective:  Pulse 72    Temp 98.6 F (37 C) (Temporal)    Ht 5\' 4"  (1.626 m)    Wt 184 lb (83.5 kg)    BMI 31.58 kg/m  Gen: Afebrile. No acute distress.  HENT: AT. [place]. MMM.  Chest: no cough or shortness of breath,  MSK: no erythema, no soft tissue swelling. TTP left SCM/scalene and trap. Discomfort with right  cervical SB. Mild tingling in fingers, otherwise NV intact,  Neuro: Normal gait. PERLA. EOMi. Alert. Oriented x3  Assessment and Plan:  Madeline Murphy is a 43 y.o. female present for Strain of thoracic back region/Cervical radiculopathy/cervical neck strain -Continued discomfort of left cervical/sternocleidomastoid discomfort radiating into her trapezium.  Her other symptoms have improved.  She is still is having some mild sensation changes in her left hand.  -Continue rest, ice/heat, stretches - start gabapentin taper.  - tramadol TID PRN for moderate to severe pain.  -Flexeril up to 3 times daily as needed, start at night, sedation precautions provided Follow-up 2 weeks with PCP -if symptoms are still present with radiculopathy may need imaging study.  > 15 minutes spent with patient, >50% of time spent face to face    Howard Pouch, DO 05/31/2018

## 2018-06-03 ENCOUNTER — Encounter: Payer: Self-pay | Admitting: Family Medicine

## 2018-06-03 NOTE — Patient Instructions (Addendum)
Continue Flexeril 3 times daily as needed. Gabapentin taper 200 mg nightly, 100 mg in the morning and 100 mg in the afternoon.  If pain continues on this regimen after 3 days, may increase nighttime dose to 300 mg nightly. Tramadol prescribed 3 times daily as needed for pain. Follow-up with PCP in 2 weeks.

## 2018-06-22 ENCOUNTER — Other Ambulatory Visit: Payer: Self-pay | Admitting: Family Medicine

## 2018-06-24 NOTE — Telephone Encounter (Signed)
RF request for Gabapentin  LOV: 05/31/2018 Next ov: Not scheduled  Last written: 05/31/2018 #120  No refills.   Called pt to see if she still had the numbness and tingling in left arm as Pt was seen for acute visit.

## 2018-06-24 NOTE — Telephone Encounter (Signed)
LM for patient to return call to discuss medication refills

## 2018-06-25 NOTE — Telephone Encounter (Signed)
Per my note- she was to follow with her PCP in 2 weeks for follow up.

## 2018-08-02 ENCOUNTER — Encounter: Payer: Self-pay | Admitting: Family Medicine

## 2018-08-13 ENCOUNTER — Encounter: Payer: Self-pay | Admitting: Family Medicine

## 2018-12-13 ENCOUNTER — Other Ambulatory Visit: Payer: Self-pay | Admitting: Family Medicine

## 2019-03-07 ENCOUNTER — Other Ambulatory Visit: Payer: Self-pay | Admitting: Family Medicine

## 2019-03-07 NOTE — Telephone Encounter (Signed)
Pt has enough until 03/15/19. Pt has not had f/u appt with PCP since 2019. LM for pt to return call to schedule.

## 2019-03-11 NOTE — Telephone Encounter (Signed)
Contacted patient, f/u appt scheduled. Will deny refill for now until appt in case of dose change.

## 2019-03-13 ENCOUNTER — Other Ambulatory Visit: Payer: Self-pay

## 2019-03-13 DIAGNOSIS — R102 Pelvic and perineal pain unspecified side: Secondary | ICD-10-CM | POA: Insufficient documentation

## 2019-03-13 DIAGNOSIS — N900 Mild vulvar dysplasia: Secondary | ICD-10-CM | POA: Insufficient documentation

## 2019-03-17 ENCOUNTER — Other Ambulatory Visit: Payer: Self-pay

## 2019-03-17 ENCOUNTER — Ambulatory Visit (INDEPENDENT_AMBULATORY_CARE_PROVIDER_SITE_OTHER): Payer: Self-pay | Admitting: Family Medicine

## 2019-03-17 ENCOUNTER — Encounter: Payer: Self-pay | Admitting: Family Medicine

## 2019-03-17 VITALS — Wt 185.0 lb

## 2019-03-17 DIAGNOSIS — G2581 Restless legs syndrome: Secondary | ICD-10-CM

## 2019-03-17 MED ORDER — ROPINIROLE HCL 1 MG PO TABS: ORAL_TABLET | ORAL | 0 refills | Status: DC

## 2019-03-17 NOTE — Progress Notes (Signed)
Virtual Visit via Video Note  I connected with Madeline Murphy on 03/17/19 at  9:30 AM EST by a video enabled telemedicine application and verified that I am speaking with the correct person using two identifiers.  Location patient: home Location provider:work or home office Persons participating in the virtual visit: patient, provider  I discussed the limitations of evaluation and management by telemedicine and the availability of in person appointments. The patient expressed understanding and agreed to proceed.  Telemedicine visit is a necessity given the COVID-19 restrictions in place at the current time.  HPI: 44 y/o WF being seen today for f/u RLS.  Her last f/u with me for this condition was 2019 at which time she was doing well on 1-2 mg of ropinirole qhs.  Interim hx: Was stable until about 3 mo ago when she started to feel a bit more restlessness problems in legs and also her forearms felt the same way.  Not every night with forearms. Seems to always start in evenings when starting to try to relax and when lying down to go to sleep. +Impairing sleep lately more. Denies abnl vag bleeding, in fact no menses b/c has mirena. No BRBPR, hematochezia, or melena. Iron in diet sounds fair, but no red meat intake at all. She has been trying a topical magnesium prep on forearms and this has helped some.   ROS: See pertinent positives and negatives per HPI.  Past Medical History:  Diagnosis Date  . Depression with anxiety 09/12/2012   With some PMS components  . Insomnia 09/12/2012  . Other and unspecified hyperlipidemia 09/12/2012  . Overweight(278.02) 06/02/2012  . Pes anserine bursitis 12/2016   Steroid injection by Dr. Tamala Julian 12/2016  . Right arm pain 08/03/2014  . Right shoulder pain 08/03/2014  . RLS (restless legs syndrome) 09/12/2012  . Sinusitis 02/19/2012  . Vaginitis 06/02/2011   recurrent vulvovaginal candidiasis; hx of allergy/intolerance to OTC topical antifungal treatments.    Past  Surgical History:  Procedure Laterality Date  . chronc compartmenal syndrome     S/P ortho surgery x3  . INTRAUTERINE DEVICE INSERTION  2017   Mirena    Family History  Problem Relation Age of Onset  . Hyperlipidemia Mother   . Heart disease Mother        palpitation  . Hyperlipidemia Father   . Hypertension Father   . Heart disease Father        s/p bypass and stent, passed from MI 06/24/14  . Otitis media Daughter        recurrent strep  . Cancer Maternal Aunt        breast  . Cancer Maternal Grandfather        possibly stomach  . Aneurysm Paternal Grandmother        likely brain  . Cancer Paternal Grandmother        breast  . Otitis media Daughter   . Coronary artery disease Unknown   . Hypertension Unknown     SOCIAL HX:  Social History   Socioeconomic History  . Marital status: Married    Spouse name: Not on file  . Number of children: Not on file  . Years of education: Not on file  . Highest education level: Not on file  Occupational History  . Not on file  Tobacco Use  . Smoking status: Never Smoker  . Smokeless tobacco: Never Used  Substance and Sexual Activity  . Alcohol use: Yes  . Drug use: No  . Sexual  activity: Yes    Birth control/protection: Pill  Other Topics Concern  . Not on file  Social History Narrative  . Not on file   Social Determinants of Health   Financial Resource Strain:   . Difficulty of Paying Living Expenses: Not on file  Food Insecurity:   . Worried About Charity fundraiser in the Last Year: Not on file  . Ran Out of Food in the Last Year: Not on file  Transportation Needs:   . Lack of Transportation (Medical): Not on file  . Lack of Transportation (Non-Medical): Not on file  Physical Activity:   . Days of Exercise per Week: Not on file  . Minutes of Exercise per Session: Not on file  Stress:   . Feeling of Stress : Not on file  Social Connections:   . Frequency of Communication with Friends and Family: Not on file   . Frequency of Social Gatherings with Friends and Family: Not on file  . Attends Religious Services: Not on file  . Active Member of Clubs or Organizations: Not on file  . Attends Archivist Meetings: Not on file  . Marital Status: Not on file      Current Outpatient Medications:  .  levonorgestrel (MIRENA, 52 MG,) 20 MCG/24HR IUD, Mirena 20 mcg/24 hours (5 yrs) 52 mg intrauterine device  Take 1 device by intrauterine route., Disp: , Rfl:  .  Probiotic Product (PROBIOTIC PO), Take by mouth daily., Disp: , Rfl:  .  rOPINIRole (REQUIP) 1 MG tablet, TAKE 2 TABLETS BY MOUTH AT BEDTIME.** OFFICE VISIT NEEDED**, Disp: 180 tablet, Rfl: 0 .  VITAMIN D PO, Vitamin D, Disp: , Rfl:  .  cyclobenzaprine (FLEXERIL) 10 MG tablet, Take 1 tablet (10 mg total) by mouth 3 (three) times daily as needed for muscle spasms. (Patient not taking: Reported on 03/17/2019), Disp: 30 tablet, Rfl: 0 .  Multiple Vitamins-Minerals (MULTIVITAMIN PO), Take by mouth daily., Disp: , Rfl:   EXAM:  VITALS per patient if applicable: Wt 185 lb (83.9 kg)   BMI 31.76 kg/m    GENERAL: alert, oriented, appears well and in no acute distress  HEENT: atraumatic, conjunttiva clear, no obvious abnormalities on inspection of external nose and ears  NECK: normal movements of the head and neck  LUNGS: on inspection no signs of respiratory distress, breathing rate appears normal, no obvious gross SOB, gasping or wheezing  CV: no obvious cyanosis  MS: moves all visible extremities without noticeable abnormality  PSYCH/NEURO: pleasant and cooperative, no obvious depression or anxiety, speech and thought processing grossly intact  LABS: none today    Chemistry      Component Value Date/Time   NA 139 08/02/2015 0823   K 4.0 08/02/2015 0823   CL 102 08/02/2015 0823   CO2 28 08/02/2015 0823   BUN 13 08/02/2015 0823   CREATININE 0.82 08/02/2015 0823   CREATININE 0.78 02/02/2011 1101      Component Value Date/Time    CALCIUM 9.4 08/02/2015 0823   ALKPHOS 72 08/02/2015 0823   AST 15 08/02/2015 0823   ALT 17 08/02/2015 0823   BILITOT 0.4 08/02/2015 0823      Lab Results  Component Value Date   WBC 7.9 08/02/2015   HGB 13.7 08/02/2015   HCT 41.8 08/02/2015   MCV 80.5 08/02/2015   PLT 302.0 08/02/2015  No results found for: IRON, TIBC, FERRITIN  Lab Results  Component Value Date   TSH 1.39 08/02/2015  ASSESSMENT AND PLAN:  Discussed the following assessment and plan:  Restless legs syndrome, now with some arm sx's. Will check iron/cbc at her earliest convenience. Increase ropinirole to 3 tabs qhs: inc by 1/2 tab first and then whole tab if tolerated. Take the additional 1/2-1 tab around suppertime and see if it helps evening tendency to get restlessness BEFORE going down to bed/sleep.   I discussed the assessment and treatment plan with the patient. The patient was provided an opportunity to ask questions and all were answered. The patient agreed with the plan and demonstrated an understanding of the instructions.   The patient was advised to call back or seek an in-person evaluation if the symptoms worsen or if the condition fails to improve as anticipated.  F/u: 2-3 wks doxy. Lab visit as above.  Signed:  Crissie Sickles, MD           03/17/2019

## 2019-03-21 ENCOUNTER — Other Ambulatory Visit: Payer: Self-pay

## 2019-03-21 ENCOUNTER — Ambulatory Visit (INDEPENDENT_AMBULATORY_CARE_PROVIDER_SITE_OTHER): Payer: Self-pay | Admitting: Family Medicine

## 2019-03-21 DIAGNOSIS — G2581 Restless legs syndrome: Secondary | ICD-10-CM

## 2019-03-21 LAB — CBC WITH DIFFERENTIAL/PLATELET
Basophils Absolute: 0.1 10*3/uL (ref 0.0–0.1)
Basophils Relative: 0.8 % (ref 0.0–3.0)
Eosinophils Absolute: 0.1 10*3/uL (ref 0.0–0.7)
Eosinophils Relative: 1.9 % (ref 0.0–5.0)
HCT: 41.7 % (ref 36.0–46.0)
Hemoglobin: 13.3 g/dL (ref 12.0–15.0)
Lymphocytes Relative: 26.3 % (ref 12.0–46.0)
Lymphs Abs: 1.8 10*3/uL (ref 0.7–4.0)
MCHC: 31.9 g/dL (ref 30.0–36.0)
MCV: 81.9 fl (ref 78.0–100.0)
Monocytes Absolute: 0.5 10*3/uL (ref 0.1–1.0)
Monocytes Relative: 6.8 % (ref 3.0–12.0)
Neutro Abs: 4.3 10*3/uL (ref 1.4–7.7)
Neutrophils Relative %: 64.2 % (ref 43.0–77.0)
Platelets: 293 10*3/uL (ref 150.0–400.0)
RBC: 5.09 Mil/uL (ref 3.87–5.11)
RDW: 13.5 % (ref 11.5–15.5)
WBC: 6.7 10*3/uL (ref 4.0–10.5)

## 2019-03-22 LAB — IRON,TIBC AND FERRITIN PANEL
%SAT: 27 % (calc) (ref 16–45)
Ferritin: 19 ng/mL (ref 16–232)
Iron: 82 ug/dL (ref 40–190)
TIBC: 299 mcg/dL (calc) (ref 250–450)

## 2019-04-03 ENCOUNTER — Ambulatory Visit: Payer: Self-pay | Admitting: Family Medicine

## 2019-04-09 ENCOUNTER — Ambulatory Visit (INDEPENDENT_AMBULATORY_CARE_PROVIDER_SITE_OTHER): Payer: Self-pay | Admitting: Family Medicine

## 2019-04-09 ENCOUNTER — Other Ambulatory Visit: Payer: Self-pay

## 2019-04-09 ENCOUNTER — Encounter: Payer: Self-pay | Admitting: Family Medicine

## 2019-04-09 DIAGNOSIS — R790 Abnormal level of blood mineral: Secondary | ICD-10-CM

## 2019-04-09 DIAGNOSIS — G2581 Restless legs syndrome: Secondary | ICD-10-CM

## 2019-04-09 NOTE — Progress Notes (Signed)
Virtual Visit via Video Note  I connected with Madeline Murphy on 04/09/19 at 11:00 AM EST by a video enabled telemedicine application and verified that I am speaking with the correct person using two identifiers.  Location patient: home Location provider:work or home office Persons participating in the virtual visit: patient, provider  I discussed the limitations of evaluation and management by telemedicine and the availability of in person appointments. The patient expressed understanding and agreed to proceed.  Telemedicine visit is a necessity given the COVID-19 restrictions in place at the current time.  HPI: 44 y/o WF being seen today for 3 week f/u RLS. A/P as of last visit: "Restless legs syndrome, now with some arm sx's. Will check iron/cbc at her earliest convenience. Increase ropinirole to 3 tabs qhs: inc by 1/2 tab first and then whole tab if tolerated. Take the additional 1/2-1 tab around suppertime and see if it helps evening tendency to get restlessness BEFORE going down to bed/sleep."  Interim hx: Labs at last visit showed normal Hb, normal MCV, normal iron/TIBC/%sat, but borderline low ferritin (19). I recommended she start ferrous sulfate 325mg  qd and increase iron in diet.  Feeling improved significantly and is satisfied with the state of things currently. Taking 1 tab qhs and 1/2 tab q suppertime. Tolerating meds at this dosing.  Higher dosing caused too much GI upset. Taking an iron tab per day as recommended.  On good nights she gets to sleep in 30 min approx, bad nights of RLS sx's prevent initiation of sleep for "hours".  She rubs on magnesium topical prep and sometimes showers when bad nights occur, has never tried taking another dose of ropinirole at these times.     ROS: See pertinent positives and negatives per HPI.  Past Medical History:  Diagnosis Date  . Depression with anxiety 09/12/2012   With some PMS components  . Insomnia 09/12/2012  . Other and  unspecified hyperlipidemia 09/12/2012  . Overweight(278.02) 06/02/2012  . Pes anserine bursitis 12/2016   Steroid injection by Dr. Tamala Julian 12/2016  . Right arm pain 08/03/2014  . Right shoulder pain 08/03/2014  . RLS (restless legs syndrome) 09/12/2012  . Sinusitis 02/19/2012  . Vaginitis 06/02/2011   recurrent vulvovaginal candidiasis; hx of allergy/intolerance to OTC topical antifungal treatments.    Past Surgical History:  Procedure Laterality Date  . chronc compartmenal syndrome     S/P ortho surgery x3  . INTRAUTERINE DEVICE INSERTION  2017   Mirena    Family History  Problem Relation Age of Onset  . Hyperlipidemia Mother   . Heart disease Mother        palpitation  . Hyperlipidemia Father   . Hypertension Father   . Heart disease Father        s/p bypass and stent, passed from MI 06/24/14  . Otitis media Daughter        recurrent strep  . Cancer Maternal Aunt        breast  . Cancer Maternal Grandfather        possibly stomach  . Aneurysm Paternal Grandmother        likely brain  . Cancer Paternal Grandmother        breast  . Otitis media Daughter   . Coronary artery disease Unknown   . Hypertension Unknown    Social History   Socioeconomic History  . Marital status: Married    Spouse name: Not on file  . Number of children: Not on file  . Years  of education: Not on file  . Highest education level: Not on file  Occupational History  . Not on file  Tobacco Use  . Smoking status: Never Smoker  . Smokeless tobacco: Never Used  Substance and Sexual Activity  . Alcohol use: Yes  . Drug use: No  . Sexual activity: Yes    Birth control/protection: Pill  Other Topics Concern  . Not on file  Social History Narrative  . Not on file   Social Determinants of Health   Financial Resource Strain:   . Difficulty of Paying Living Expenses: Not on file  Food Insecurity:   . Worried About Charity fundraiser in the Last Year: Not on file  . Ran Out of Food in the Last  Year: Not on file  Transportation Needs:   . Lack of Transportation (Medical): Not on file  . Lack of Transportation (Non-Medical): Not on file  Physical Activity:   . Days of Exercise per Week: Not on file  . Minutes of Exercise per Session: Not on file  Stress:   . Feeling of Stress : Not on file  Social Connections:   . Frequency of Communication with Friends and Family: Not on file  . Frequency of Social Gatherings with Friends and Family: Not on file  . Attends Religious Services: Not on file  . Active Member of Clubs or Organizations: Not on file  . Attends Archivist Meetings: Not on file  . Marital Status: Not on file      Current Outpatient Medications:  .  levonorgestrel (MIRENA, 52 MG,) 20 MCG/24HR IUD, Mirena 20 mcg/24 hours (5 yrs) 52 mg intrauterine device  Take 1 device by intrauterine route., Disp: , Rfl:  .  Multiple Vitamins-Minerals (MULTIVITAMIN PO), Take by mouth daily., Disp: , Rfl:  .  Probiotic Product (PROBIOTIC PO), Take by mouth daily., Disp: , Rfl:  .  rOPINIRole (REQUIP) 1 MG tablet, TAKE 3 TABLETS BY MOUTH AT BEDTIME., Disp: 270 tablet, Rfl: 0 .  VITAMIN D PO, Vitamin D, Disp: , Rfl:  .  cyclobenzaprine (FLEXERIL) 10 MG tablet, Take 1 tablet (10 mg total) by mouth 3 (three) times daily as needed for muscle spasms. (Patient not taking: Reported on 03/17/2019), Disp: 30 tablet, Rfl: 0  EXAM:  VITALS per patient if applicable: There were no vitals taken for this visit.   GENERAL: alert, oriented, appears well and in no acute distress  HEENT: atraumatic, conjunttiva clear, no obvious abnormalities on inspection of external nose and ears  NECK: normal movements of the head and neck  LUNGS: on inspection no signs of respiratory distress, breathing rate appears normal, no obvious gross SOB, gasping or wheezing  CV: no obvious cyanosis  MS: moves all visible extremities without noticeable abnormality  PSYCH/NEURO: pleasant and cooperative, no  obvious depression or anxiety, speech and thought processing grossly intact  LABS: none today  Lab Results  Component Value Date   WBC 6.7 03/21/2019   HGB 13.3 03/21/2019   HCT 41.7 03/21/2019   MCV 81.9 03/21/2019   PLT 293.0 03/21/2019   Lab Results  Component Value Date   IRON 82 03/21/2019   TIBC 299 03/21/2019   FERRITIN 19 03/21/2019     Chemistry      Component Value Date/Time   NA 139 08/02/2015 0823   K 4.0 08/02/2015 0823   CL 102 08/02/2015 0823   CO2 28 08/02/2015 0823   BUN 13 08/02/2015 0823   CREATININE 0.82 08/02/2015  [id number]   CREATININE 0.78 02/02/2011 1101      Component Value Date/Time   CALCIUM 9.4 08/02/2015 0823   ALKPHOS 72 08/02/2015 0823   AST 15 08/02/2015 0823   ALT 17 08/02/2015 0823   BILITOT 0.4 08/02/2015 0823      ASSESSMENT AND PLAN:  Discussed the following assessment and plan:  1) RLS; signif improved/satisfactory regimen. Continue ropinirole 1mg  hs and 1/2 mg with supper nightly. May add additional 1/2-1 mg dose if she has taken 1hr or more to initiate sleep. Unclear if recent borderline low iron stores contributing to sx's, but we'll continue FeSO4 325mg  qd to get these up and rpt iron panel and cbc in 2 mo.   I discussed the assessment and treatment plan with the patient. The patient was provided an opportunity to ask questions and all were answered. The patient agreed with the plan and demonstrated an understanding of the instructions.   The patient was advised to call back or seek an in-person evaluation if the symptoms worsen or if the condition fails to improve as anticipated.  F/U: 2 mo lab for repeat iron/cbc, 6 mo in office for cpe  Signed:  Crissie Sickles, MD           04/09/2019

## 2019-06-16 ENCOUNTER — Other Ambulatory Visit: Payer: Self-pay | Admitting: Family Medicine

## 2020-01-28 ENCOUNTER — Other Ambulatory Visit: Payer: Self-pay

## 2020-01-29 ENCOUNTER — Ambulatory Visit (INDEPENDENT_AMBULATORY_CARE_PROVIDER_SITE_OTHER): Payer: Self-pay | Admitting: Family Medicine

## 2020-01-29 ENCOUNTER — Encounter: Payer: Self-pay | Admitting: Family Medicine

## 2020-01-29 VITALS — BP 121/83 | HR 65 | Temp 98.3°F | Ht 64.0 in | Wt 191.0 lb

## 2020-01-29 DIAGNOSIS — R6882 Decreased libido: Secondary | ICD-10-CM

## 2020-01-29 DIAGNOSIS — G2581 Restless legs syndrome: Secondary | ICD-10-CM

## 2020-01-29 DIAGNOSIS — K219 Gastro-esophageal reflux disease without esophagitis: Secondary | ICD-10-CM

## 2020-01-29 DIAGNOSIS — Z131 Encounter for screening for diabetes mellitus: Secondary | ICD-10-CM

## 2020-01-29 DIAGNOSIS — Z13220 Encounter for screening for lipoid disorders: Secondary | ICD-10-CM

## 2020-01-29 DIAGNOSIS — E611 Iron deficiency: Secondary | ICD-10-CM

## 2020-01-29 DIAGNOSIS — Z23 Encounter for immunization: Secondary | ICD-10-CM

## 2020-01-29 LAB — COMPREHENSIVE METABOLIC PANEL
ALT: 15 U/L (ref 0–35)
AST: 14 U/L (ref 0–37)
Albumin: 4.4 g/dL (ref 3.5–5.2)
Alkaline Phosphatase: 72 U/L (ref 39–117)
BUN: 8 mg/dL (ref 6–23)
CO2: 29 mEq/L (ref 19–32)
Calcium: 9.4 mg/dL (ref 8.4–10.5)
Chloride: 103 mEq/L (ref 96–112)
Creatinine, Ser: 0.83 mg/dL (ref 0.40–1.20)
GFR: 86.41 mL/min (ref >60.00)
Glucose, Bld: 87 mg/dL (ref 70–99)
Potassium: 4.2 mEq/L (ref 3.5–5.1)
Sodium: 138 mEq/L (ref 135–145)
Total Bilirubin: 0.6 mg/dL (ref 0.2–1.2)
Total Protein: 7.6 g/dL (ref 6.0–8.3)

## 2020-01-29 LAB — CBC WITH DIFFERENTIAL/PLATELET
Basophils Absolute: 0 10*3/uL (ref 0.0–0.1)
Basophils Relative: 0.6 % (ref 0.0–3.0)
Eosinophils Absolute: 0.2 10*3/uL (ref 0.0–0.7)
Eosinophils Relative: 2.6 % (ref 0.0–5.0)
HCT: 40.8 % (ref 36.0–46.0)
Hemoglobin: 13.4 g/dL (ref 12.0–15.0)
Lymphocytes Relative: 23.2 % (ref 12.0–46.0)
Lymphs Abs: 1.8 10*3/uL (ref 0.7–4.0)
MCHC: 32.7 g/dL (ref 30.0–36.0)
MCV: 80.5 fl (ref 78.0–100.0)
Monocytes Absolute: 0.5 10*3/uL (ref 0.1–1.0)
Monocytes Relative: 5.8 % (ref 3.0–12.0)
Neutro Abs: 5.3 10*3/uL (ref 1.4–7.7)
Neutrophils Relative %: 67.8 % (ref 43.0–77.0)
Platelets: 298 10*3/uL (ref 150.0–400.0)
RBC: 5.07 Mil/uL (ref 3.87–5.11)
RDW: 13.3 % (ref 11.5–15.5)
WBC: 7.8 10*3/uL (ref 4.0–10.5)

## 2020-01-29 LAB — LIPID PANEL
Cholesterol: 192 mg/dL (ref 0–200)
HDL: 49.8 mg/dL (ref >39.00)
LDL Cholesterol: 111 mg/dL — ABNORMAL HIGH (ref 0–99)
NonHDL: 142.55
Total CHOL/HDL Ratio: 4
Triglycerides: 159 mg/dL — ABNORMAL HIGH (ref 0.0–149.0)
VLDL: 31.8 mg/dL (ref 0.0–40.0)

## 2020-01-29 LAB — TSH: TSH: 1.38 u[IU]/mL (ref 0.35–4.50)

## 2020-01-29 MED ORDER — ROPINIROLE HCL 1 MG PO TABS: ORAL_TABLET | ORAL | 1 refills | Status: DC

## 2020-01-29 NOTE — Addendum Note (Signed)
Addended by: Kavin Leech on: 01/29/2020 01:53 PM   Modules accepted: Orders

## 2020-01-29 NOTE — Progress Notes (Signed)
OFFICE VISIT  01/29/2020  CC:  Chief Complaint  Patient presents with  . Follow-up    RLS    HPI:    Patient is a 44 y.o. Caucasian female who presents for f/u RLS, hx of low ferritin but normal hemogram. I last saw her 10 months ago (virtual visit). A/P as of last visit: "1) RLS; signif improved/satisfactory regimen. Continue ropinirole 1mg  hs and 1/2 mg with supper nightly. May add additional 1/2-1 mg dose if she has taken 1hr or more to initiate sleep. Unclear if recent borderline low iron stores contributing to sx's, but we'll continue FeSO4 325mg  qd to get these up and rpt iron panel and cbc in 2 mo."  INTERIM HX: Of note, pt did not come in and get the CBC and iron panel planned after last visit. Did not take any iron either.  From RLS and restless arms standpoint she feels like sx's are well on 1 mg ropinirole qhs. Stressed with work, causing panic in middle of night sometimes, GERD sx's increased more lately but fortunately responds to otc strength omeprazole.  Says her GER sx's not really assoc w/any particular culprit foods or overeating---seems to be tied to the times of the most stress. No depressed mood.  Says no sex drive for many years.  Seems to enjoy intimacy ok when in the process but never initiates it and "can take it or leave it". Husband frustrated.  She is not worried.  She is constantly distracted by all of life's other issues. Loves husband and cares for him, has no intimate feelings for any other person.  ROS: no fevers, no CP, no SOB, no wheezing, no cough, no dizziness, no HAs, no rashes, no melena/hematochezia.  No polyuria or polydipsia.  No myalgias or arthralgias.  No focal weakness, paresthesias, or tremors.  No acute vision or hearing abnormalities. No n/v/d or abd pain.  No palpitations.   No vag bleeding (no menses at all---she has mirena IUD.)   Past Medical History:  Diagnosis Date  . Depression with anxiety 09/12/2012   With some PMS  components  . Insomnia 09/12/2012  . Other and unspecified hyperlipidemia 09/12/2012  . Overweight(278.02) 06/02/2012  . Pes anserine bursitis 12/2016   Steroid injection by Dr. Tamala Julian 12/2016  . Right arm pain 08/03/2014  . Right shoulder pain 08/03/2014  . RLS (restless legs syndrome) 09/12/2012  . Sinusitis 02/19/2012  . Vaginitis 06/02/2011   recurrent vulvovaginal candidiasis; hx of allergy/intolerance to OTC topical antifungal treatments.    Past Surgical History:  Procedure Laterality Date  . chronc compartmenal syndrome     S/P ortho surgery x3  . INTRAUTERINE DEVICE INSERTION  2017   Mirena    Outpatient Medications Prior to Visit  Medication Sig Dispense Refill  . rOPINIRole (REQUIP) 1 MG tablet TAKE 3 TABLETS BY MOUTH EVERY DAY AT BEDTIME 270 tablet 0  . levonorgestrel (MIRENA) 20 MCG/24HR IUD Mirena 20 mcg/24 hours (5 yrs) 52 mg intrauterine device  Take 1 device by intrauterine route. (Patient not taking: Reported on 01/29/2020)    . cyclobenzaprine (FLEXERIL) 10 MG tablet Take 1 tablet (10 mg total) by mouth 3 (three) times daily as needed for muscle spasms. (Patient not taking: No sig reported) 30 tablet 0  . Multiple Vitamins-Minerals (MULTIVITAMIN PO) Take by mouth daily. (Patient not taking: Reported on 01/29/2020)    . Probiotic Product (PROBIOTIC PO) Take by mouth daily. (Patient not taking: Reported on 01/29/2020)    . VITAMIN D PO  Vitamin D (Patient not taking: Reported on 01/29/2020)     No facility-administered medications prior to visit.    Allergies  Allergen Reactions  . Oxycodone-Acetaminophen Nausea And Vomiting  . Codeine Nausea Only and Nausea And Vomiting    Syncope Syncope   . Melatonin Anxiety    ROS As per HPI  PE: Vitals with BMI 01/29/2020 03/17/2019 05/31/2018  Height 5\' 4"  - 5\' 4"   Weight 191 lbs 185 lbs 184 lbs  BMI 67.12 - 45.80  Systolic 998 - -  Diastolic 83 - -  Pulse 65 - 72   Gen: Alert, well appearing.  Patient is oriented to  person, place, time, and situation. AFFECT: pleasant, lucid thought and speech. PJA:SNKN: no injection, icteris, swelling, or exudate.  EOMI, PERRLA. Mouth: lips without lesion/swelling.  Oral mucosa pink and moist. Oropharynx without erythema, exudate, or swelling.  Neck - No masses or thyromegaly or limitation in range of motion CV: RRR, no m/r/g.   LUNGS: CTA bilat, nonlabored resps, good aeration in all lung fields. EXT: no clubbing or cyanosis.  no edema.    LABS:    Chemistry      Component Value Date/Time   NA 139 08/02/2015 0823   K 4.0 08/02/2015 0823   CL 102 08/02/2015 0823   CO2 28 08/02/2015 0823   BUN 13 08/02/2015 0823   CREATININE 0.82 08/02/2015 0823   CREATININE 0.78 02/02/2011 1101      Component Value Date/Time   CALCIUM 9.4 08/02/2015 0823   ALKPHOS 72 08/02/2015 0823   AST 15 08/02/2015 0823   ALT 17 08/02/2015 0823   BILITOT 0.4 08/02/2015 0823     Lab Results  Component Value Date   WBC 6.7 03/21/2019   HGB 13.3 03/21/2019   HCT 41.7 03/21/2019   MCV 81.9 03/21/2019   PLT 293.0 03/21/2019   Lab Results  Component Value Date   IRON 82 03/21/2019   TIBC 299 03/21/2019   FERRITIN 19 03/21/2019   IMPRESSION AND PLAN:  1) RLS, well controlled. Continue 1 to 3 of the 1mg  ropinirole tabs qhs. RF's today.  Rechecking iron today and if still low I'll recommend she start FeSO4 325mg  qd.  2) Iron def w/out anemia: ferritin at lowest end of normal 10 mo ago. She states her iron was low prior to that as well (at a bariatric office). Recheck cbc and iron panel today.   She has not been taking any iron supplement since I saw her 10 mo ago.  3) GERD: inc secondary to acute stress.   She'll continue prn omeprazole which is helping well but if needs >3d/week she should start taking this daily as preventative, call our office if desires rx.  4) Decreased libido: no underlying psych or physical pathology suspected. She is extremely busy and distracted.   Inc efforts at initiating intimacy w/husband was encouraged and hopefully this will start a cycle of recurrent intimate behavior.  5) Screening for hyperlipidemia and diabetes: fasting cmet and lipid panel obtained today. Based on dx of obesity I'll screen for hypothyroidism as well--tsh today.  An After Visit Summary was printed and given to the patient.  FOLLOW UP: No follow-ups on file.  Signed:  Crissie Sickles, MD           01/29/2020

## 2020-01-30 LAB — IRON,TIBC AND FERRITIN PANEL
%SAT: 33 % (calc) (ref 16–45)
Ferritin: 26 ng/mL (ref 16–232)
Iron: 95 ug/dL (ref 40–190)
TIBC: 289 mcg/dL (calc) (ref 250–450)

## 2020-02-14 DIAGNOSIS — U071 COVID-19: Secondary | ICD-10-CM

## 2020-02-14 DIAGNOSIS — R600 Localized edema: Secondary | ICD-10-CM

## 2020-02-14 HISTORY — DX: Localized edema: R60.0

## 2020-02-14 HISTORY — DX: COVID-19: U07.1

## 2020-03-18 ENCOUNTER — Telehealth (INDEPENDENT_AMBULATORY_CARE_PROVIDER_SITE_OTHER): Payer: Self-pay | Admitting: Family Medicine

## 2020-03-18 DIAGNOSIS — Z8616 Personal history of COVID-19: Secondary | ICD-10-CM

## 2020-03-18 DIAGNOSIS — R0683 Snoring: Secondary | ICD-10-CM

## 2020-03-18 DIAGNOSIS — R059 Cough, unspecified: Secondary | ICD-10-CM

## 2020-03-18 MED ORDER — BENZONATATE 100 MG PO CAPS: 100.0000 mg | ORAL_CAPSULE | Freq: Three times a day (TID) | ORAL | 0 refills | Status: DC | PRN

## 2020-03-18 MED ORDER — ALBUTEROL SULFATE HFA 108 (90 BASE) MCG/ACT IN AERS: 2.0000 | INHALATION_SPRAY | Freq: Four times a day (QID) | RESPIRATORY_TRACT | 0 refills | Status: DC | PRN

## 2020-03-18 NOTE — Progress Notes (Signed)
Virtual Visit via Video Note  I connected with Caris  on 03/18/20 at 12:00 PM EST by a video enabled telemedicine application and verified that I am speaking with the correct person using two identifiers.  Location patient: home, [place] Location provider:work or home office Persons participating in the virtual visit: patient, provider  I discussed the limitations of evaluation and management by telemedicine and the availability of in person appointments. The patient expressed understanding and agreed to proceed.   HPI:  Acute telemedicine visit for a cough: -Onset: reports gets this every year; started with covid a few weeks ago -Symptoms include:persistent cough, occasional clear phlegm -Denies: SOB, CP, wheezing, fevers, malaise, NVD -Has tried: musinex, nettie pot -Pertinent past medical history: reports using inhaler for this in the past -Pertinent medication allergies: oxycodone, codeine, melatonin -denies any chance of pregnancy  Also had snoring, has seemed to develop with weight gain. Husband notices this.   ROS: See pertinent positives and negatives per HPI.  Past Medical History:  Diagnosis Date  . Depression with anxiety 09/12/2012   With some PMS components  . Insomnia 09/12/2012  . Other and unspecified hyperlipidemia 09/12/2012  . Overweight(278.02) 06/02/2012  . Pes anserine bursitis 12/2016   Steroid injection by Dr. Tamala Julian 12/2016  . Right arm pain 08/03/2014  . Right shoulder pain 08/03/2014  . RLS (restless legs syndrome) 09/12/2012  . Sinusitis 02/19/2012  . Vaginitis 06/02/2011   recurrent vulvovaginal candidiasis; hx of allergy/intolerance to OTC topical antifungal treatments.    Past Surgical History:  Procedure Laterality Date  . chronc compartmenal syndrome     S/P ortho surgery x3  . INTRAUTERINE DEVICE INSERTION  2017   Mirena     Current Outpatient Medications:  .  albuterol (PROAIR HFA) 108 (90 Base) MCG/ACT inhaler, Inhale 2 puffs into the lungs  every 6 (six) hours as needed for wheezing or shortness of breath., Disp: 1 each, Rfl: 0 .  benzonatate (TESSALON PERLES) 100 MG capsule, Take 1 capsule (100 mg total) by mouth 3 (three) times daily as needed., Disp: 20 capsule, Rfl: 0 .  levonorgestrel (MIRENA) 20 MCG/24HR IUD, Mirena 20 mcg/24 hours (5 yrs) 52 mg intrauterine device  Take 1 device by intrauterine route. (Patient not taking: Reported on 01/29/2020), Disp: , Rfl:  .  rOPINIRole (REQUIP) 1 MG tablet, TAKE 3 TABLETS BY MOUTH EVERY DAY AT BEDTIME, Disp: 270 tablet, Rfl: 1  EXAM:  VITALS per patient if applicable:  GENERAL: alert, oriented, appears well and in no acute distress  HEENT: atraumatic, conjunttiva clear, no obvious abnormalities on inspection of external nose and ears  NECK: normal movements of the head and neck  LUNGS: on inspection no signs of respiratory distress, breathing rate appears normal, no obvious gross SOB, gasping or wheezing  CV: no obvious cyanosis  MS: moves all visible extremities without noticeable abnormality  PSYCH/NEURO: pleasant and cooperative, no obvious depression or anxiety, speech and thought processing grossly intact  ASSESSMENT AND PLAN:  Discussed the following assessment and plan:  Cough  History of COVID-19  Snoring  -we discussed possible serious and likely etiologies, options for evaluation and workup, limitations of telemedicine visit vs in person visit, treatment, treatment risks and precautions. Pt prefers to treat via telemedicine empirically rather than in person at this moment.  Query post Covid/viral cough versus other.  Discussed possible sinusitis or other bacterial respiratory infection, though seems less likely.  Seems this is a problem she has had in the past after an upper  respiratory illness.  Inhalers have helped in the past.  Opted to try albuterol inhaler and Tessalon for cough.  Did advise low threshold for close follow-up if worsening or not improving soon.   Advised in person care would be best, for lung exam and chest x-ray. Discussed causes of snoring.  She feels her weight gain likely has contributed.  Advised even a small amount of weight gain could help to reduce this.  Included healthy lifestyle tips and patient handout.  Follow-up as needed if persists. Scheduled follow up with PCP offered: Agrees to call for follow-up as needed. Advised to seek prompt in person care if worsening, new symptoms arise, or if is not improving with treatment. Discussed options for inperson care if PCP office not available. Did let this patient know that I only do telemedicine on Tuesdays and Thursdays for [place]. Advised to schedule follow up visit with PCP or [facility] if any further questions or concerns to avoid delays in care.   I discussed the assessment and treatment plan with the patient. The patient was provided an opportunity to ask questions and all were answered. The patient agreed with the plan and demonstrated an understanding of the instructions.     Lucretia Kern, DO

## 2020-03-18 NOTE — Patient Instructions (Signed)
-  I sent the medication(s) we discussed to your pharmacy: Meds ordered this encounter  Medications  . albuterol (PROAIR HFA) 108 (90 Base) MCG/ACT inhaler    Sig: Inhale 2 puffs into the lungs every 6 (six) hours as needed for wheezing or shortness of breath.    Dispense:  1 each    Refill:  0  . benzonatate (TESSALON PERLES) 100 MG capsule    Sig: Take 1 capsule (100 mg total) by mouth 3 (three) times daily as needed.    Dispense:  20 capsule    Refill:  0   For the cough can try the medications to be sent in.  If worsening or not improving with these measures over the next 3 to 4 days, please seek in person evaluation.  For the snoring, could try weight reduction, included some lifestyle tips below to try.  If persist or is not improving, please follow-up with your primary care doctor for further evaluation.  May need sleep apnea testing.  I hope you are feeling better soon!  Seek in person care promptly if your symptoms worsen, new concerns arise or you are not improving with treatment.  It was nice to meet you today. I help [facility] out with telemedicine visits on Tuesdays and Thursdays and am available for visits on those days. If you have any concerns or questions following this visit please schedule a follow up visit with your Primary Care doctor or seek care at a local urgent care clinic to avoid delays in care.      We recommend the following healthy lifestyle for LIFE: 1) Small portions. But, make sure to get regular (at least 3 per day), healthy meals and small healthy snacks if needed.  2) Eat a healthy clean diet.   TRY TO EAT: -at least 5-7 servings of low sugar, colorful, and nutrient rich vegetables and small amounts of fruit per day (not corn, potatoes or bananas.) -berries are the best choice if you wish to eat fruit (only eat small amounts if trying to reduce weight)  -Small amounts of lean meets (fish, white meat of chicken or [place]) -vegan proteins for some  meals - beans or tofu, whole grains, nuts and seeds -Replace bad fats with good fats - good fats include: fish, nuts and seeds, canola oil, olive oil -Reduce dairy intake, small amounts of low fat or non fat dairy may be okay -100 % whole grains  -drink plenty of water  AVOID: -SUGAR, sweets, anything with added sugar, corn syrup or sweeteners - must read labels as even foods advertised as "healthy" often are loaded with sugar -if you must have a sweetener, small amounts of stevia may be best -sweetened beverages and artificially sweetened beverages -simple starches (rice, bread, potatoes, pasta, chips, etc - small amounts of 100% whole grains are ok) -red meat, pork, butter -fried foods, fast food, processed food, excessive dairy, eggs and coconut.  3)Get at least 150 minutes of sweaty aerobic exercise per week.  4)Reduce stress - consider counseling, meditation and relaxation to balance other aspects of your life.

## 2020-04-09 ENCOUNTER — Other Ambulatory Visit: Payer: Self-pay | Admitting: Family Medicine

## 2020-04-11 NOTE — Telephone Encounter (Signed)
Dr. [name] rx'd her albuterol inhaler 03/18/20. Is pt really needing RF of this already (200 puffs!)?

## 2020-04-12 NOTE — Telephone Encounter (Signed)
Attempted to contact pt for scheduling

## 2020-04-12 NOTE — Telephone Encounter (Signed)
I'll authorize RF but sounds like she needs to make appt with me to get re-eval for her symptoms.

## 2020-04-12 NOTE — Telephone Encounter (Signed)
Pt states she do indeed need the refill request. Pt states she using the inhaler BID. Pt verified clarification for instructions, she is still experiencing SOB and coughing. Pt does not take rx when coughing.

## 2020-04-13 NOTE — Telephone Encounter (Signed)
Spoke with pt to sched pt for an appt

## 2020-04-13 NOTE — Telephone Encounter (Signed)
Pt sched in office please advise if that needs to be changed. thanks

## 2020-04-29 ENCOUNTER — Ambulatory Visit: Payer: Self-pay | Admitting: Family Medicine

## 2020-07-28 ENCOUNTER — Telehealth: Payer: Self-pay

## 2020-07-28 NOTE — Telephone Encounter (Signed)
OK to drive. Needs to get out of car and walk around for 5 min every 2 hours or so. Elevate legs above the level of the heart for 20 min 1-2 times a day. Limit sodium in diet as much as possible.  Try otc compression stockings (any pharmacy should have them).

## 2020-07-28 NOTE — Telephone Encounter (Signed)
Pt states she is having tightness in her legs. Legs are swelling really bad and would like to know if it is okay for her drive. Her and her daughter are going to [place] tomorrow and will not be available to come in until Monday due to sports travel the rest of the week. Offered 4:15 work in [employer] and pt declined because she is traveling to [place] today.  Please advise, thanks

## 2020-07-28 NOTE — Telephone Encounter (Signed)
A user error has taken place: encounter opened in error, closed for administrative reasons.

## 2020-07-28 NOTE — Telephone Encounter (Signed)
Pt states legs are cool to touch they are normal besides the swelling. States she has no ankles right now.

## 2020-07-28 NOTE — Telephone Encounter (Signed)
Spoke with patient regarding recommendations.

## 2020-08-02 ENCOUNTER — Ambulatory Visit (INDEPENDENT_AMBULATORY_CARE_PROVIDER_SITE_OTHER): Payer: Self-pay | Admitting: Family Medicine

## 2020-08-02 ENCOUNTER — Other Ambulatory Visit: Payer: Self-pay

## 2020-08-02 ENCOUNTER — Encounter: Payer: Self-pay | Admitting: Family Medicine

## 2020-08-02 ENCOUNTER — Ambulatory Visit: Payer: Self-pay

## 2020-08-02 ENCOUNTER — Ambulatory Visit (HOSPITAL_BASED_OUTPATIENT_CLINIC_OR_DEPARTMENT_OTHER)
Admission: RE | Admit: 2020-08-02 | Discharge: 2020-08-02 | Disposition: A | Discharge status: Home / Self Care | Payer: Self-pay | Source: Ambulatory Visit | Attending: Family Medicine | Admitting: Family Medicine

## 2020-08-02 VITALS — BP 118/85 | HR 67 | Temp 98.3°F | Ht 64.0 in | Wt 195.4 lb

## 2020-08-02 DIAGNOSIS — R600 Localized edema: Secondary | ICD-10-CM | POA: Insufficient documentation

## 2020-08-02 LAB — CBC WITH DIFFERENTIAL/PLATELET
Basophils Absolute: 0.1 10*3/uL (ref 0.0–0.1)
Basophils Relative: 0.7 % (ref 0.0–3.0)
Eosinophils Absolute: 0.1 10*3/uL (ref 0.0–0.7)
Eosinophils Relative: 1.7 % (ref 0.0–5.0)
HCT: 39.6 % (ref 36.0–46.0)
Hemoglobin: 13.1 g/dL (ref 12.0–15.0)
Lymphocytes Relative: 28.1 % (ref 12.0–46.0)
Lymphs Abs: 2.3 10*3/uL (ref 0.7–4.0)
MCHC: 33 g/dL (ref 30.0–36.0)
MCV: 79.7 fl (ref 78.0–100.0)
Monocytes Absolute: 0.6 10*3/uL (ref 0.1–1.0)
Monocytes Relative: 7.5 % (ref 3.0–12.0)
Neutro Abs: 5.1 10*3/uL (ref 1.4–7.7)
Neutrophils Relative %: 62 % (ref 43.0–77.0)
Platelets: 301 10*3/uL (ref 150.0–400.0)
RBC: 4.97 Mil/uL (ref 3.87–5.11)
RDW: 13.5 % (ref 11.5–15.5)
WBC: 8.2 10*3/uL (ref 4.0–10.5)

## 2020-08-02 LAB — COMPREHENSIVE METABOLIC PANEL
ALT: 15 U/L (ref 0–35)
AST: 13 U/L (ref 0–37)
Albumin: 4.4 g/dL (ref 3.5–5.2)
Alkaline Phosphatase: 61 U/L (ref 39–117)
BUN: 10 mg/dL (ref 6–23)
CO2: 26 mEq/L (ref 19–32)
Calcium: 9.5 mg/dL (ref 8.4–10.5)
Chloride: 103 mEq/L (ref 96–112)
Creatinine, Ser: 0.78 mg/dL (ref 0.40–1.20)
GFR: 92.77 mL/min (ref >60.00)
Glucose, Bld: 70 mg/dL (ref 70–99)
Potassium: 3.8 mEq/L (ref 3.5–5.1)
Sodium: 138 mEq/L (ref 135–145)
Total Bilirubin: 0.5 mg/dL (ref 0.2–1.2)
Total Protein: 7.5 g/dL (ref 6.0–8.3)

## 2020-08-02 MED ORDER — FUROSEMIDE 20 MG PO TABS: ORAL_TABLET | ORAL | 1 refills | Status: DC

## 2020-08-02 NOTE — Addendum Note (Signed)
Addended by: Octaviano Glow on: 08/02/2020 01:52 PM   Modules accepted: Orders

## 2020-08-02 NOTE — Progress Notes (Signed)
OFFICE VISIT  08/02/2020  CC:  Chief Complaint  Patient presents with   Follow-up    C/o  bliateral leg swelling with LT side worse x 2 weeks.      HPI:    Patient is a 45 y.o. Caucasian female who presents for "leg swelling". Has been noticing some gradual onset/increase in swelling in ankles, then one day it acutely got signif worse, L>>R leg.  Tight and throbbing and higher up LL than usual.  Elevated legs that day and it helped some, increased water intake.  No compression hose. Does not pay attention to Na in diet.  Some fast food twice a week avg.    Lots of driving long distances lately, eating in restaurants more.  Legs SIGNIFICANTLY better today.  No SOB, CP, or LE skin changes.  No LE color changes.   Past Medical History:  Diagnosis Date   Depression with anxiety 09/12/2012   With some PMS components   Insomnia 09/12/2012   Other and unspecified hyperlipidemia 09/12/2012   Overweight(278.02) 06/02/2012   Pes anserine bursitis 12/2016   Steroid injection by Dr. Tamala Julian 12/2016   Right arm pain 08/03/2014   Right shoulder pain 08/03/2014   RLS (restless legs syndrome) 09/12/2012   Sinusitis 02/19/2012   Vaginitis 06/02/2011   recurrent vulvovaginal candidiasis; hx of allergy/intolerance to OTC topical antifungal treatments.    Past Surgical History:  Procedure Laterality Date   chronc compartmenal syndrome     S/P ortho surgery x3   INTRAUTERINE DEVICE INSERTION  2017   Mirena    Outpatient Medications Prior to Visit  Medication Sig Dispense Refill   albuterol (VENTOLIN HFA) 108 (90 Base) MCG/ACT inhaler TAKE 2 PUFFS BY MOUTH EVERY 6 HOURS AS NEEDED FOR WHEEZE OR SHORTNESS OF BREATH 6.7 each 0   benzonatate (TESSALON PERLES) 100 MG capsule Take 1 capsule (100 mg total) by mouth 3 (three) times daily as needed. 20 capsule 0   levonorgestrel (MIRENA) 20 MCG/24HR IUD      rOPINIRole (REQUIP) 1 MG tablet TAKE 3 TABLETS BY MOUTH EVERY DAY AT BEDTIME 270 tablet 1   No  facility-administered medications prior to visit.    Allergies  Allergen Reactions   Oxycodone-Acetaminophen Nausea And Vomiting   Codeine Nausea Only and Nausea And Vomiting    Syncope Syncope    Melatonin Anxiety    ROS As per HPI  PE: Vitals with BMI 08/02/2020 01/29/2020 03/17/2019  Height 5\' 4"  5\' 4"  -  Weight 195 lbs 6 oz 191 lbs 185 lbs  BMI 67.34 19.37 -  Systolic 902 409 -  Diastolic 85 83 -  Pulse 67 65 -   Gen: Alert, well appearing.  Patient is oriented to person, place, time, and situation. AFFECT: pleasant, lucid thought and speech. LEGS: no skin abnormalities. Minimal asymmetry discernable on observation->L bigger than R. Right calf circ 10 cm below inf border of patella = 46.5 cm, L 47.5 cm. 3+ pitting edema from prox tibial level down to ankles (1+ ankles). DP and PT pulses palpable.  Skin warm.  No skin weeping.  LABS:  Lab Results  Component Value Date   TSH 1.38 01/29/2020   Lab Results  Component Value Date   WBC 7.8 01/29/2020   HGB 13.4 01/29/2020   HCT 40.8 01/29/2020   MCV 80.5 01/29/2020   PLT 298.0 01/29/2020   Lab Results  Component Value Date   CREATININE 0.83 01/29/2020   BUN 8 01/29/2020   NA 138 01/29/2020  K 4.2 01/29/2020   CL 103 01/29/2020   CO2 29 01/29/2020   Lab Results  Component Value Date   ALT 15 01/29/2020   AST 14 01/29/2020   ALKPHOS 72 01/29/2020   BILITOT 0.6 01/29/2020   Lab Results  Component Value Date   CHOL 192 01/29/2020   Lab Results  Component Value Date   HDL 49.80 01/29/2020   Lab Results  Component Value Date   LDLCALC 111 (H) 01/29/2020   Lab Results  Component Value Date   TRIG 159.0 (H) 01/29/2020   Lab Results  Component Value Date   CHOLHDL 4 01/29/2020    IMPRESSION AND PLAN:  Bilat LE edema, subacute.  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 Plan: LE dopplers r/o DVT.  Discussed low Na intake, elevation prn, compression stockings (rx today), and lasix 20mg  qd prn.  Call if having to use lasix more than 3 consecutive days. CMET and cbc ordered--our lab not available today (ordered for [facility]).   An After Visit Summary was printed and given to the patient.  FOLLOW UP: Return for as needed.  Signed:  Crissie Sickles, MD           08/02/2020

## 2020-08-25 ENCOUNTER — Other Ambulatory Visit: Payer: Self-pay | Admitting: Family Medicine

## 2020-09-07 ENCOUNTER — Ambulatory Visit (INDEPENDENT_AMBULATORY_CARE_PROVIDER_SITE_OTHER): Payer: Self-pay | Admitting: Family Medicine

## 2020-09-07 ENCOUNTER — Other Ambulatory Visit: Payer: Self-pay

## 2020-09-07 ENCOUNTER — Encounter: Payer: Self-pay | Admitting: Family Medicine

## 2020-09-07 VITALS — BP 101/67 | HR 68 | Temp 98.2°F | Resp 16 | Ht 64.0 in | Wt 195.4 lb

## 2020-09-07 DIAGNOSIS — L02425 Furuncle of right lower limb: Secondary | ICD-10-CM

## 2020-09-07 MED ORDER — MUPIROCIN CALCIUM 2 % EX CREA: 1.0000 "application " | TOPICAL_CREAM | Freq: Two times a day (BID) | CUTANEOUS | 0 refills | Status: DC

## 2020-09-07 MED ORDER — DOXYCYCLINE HYCLATE 100 MG PO CAPS: 100.0000 mg | ORAL_CAPSULE | Freq: Two times a day (BID) | ORAL | 0 refills | Status: AC

## 2020-09-07 NOTE — Progress Notes (Signed)
OFFICE VISIT  09/07/2020  CC:  Chief Complaint  Patient presents with   Ingrown hair    X 1 months, started as small red bump that has gotten worse. Located on groin area   HPI:    Patient is a 45 y.o. Caucasian female who presents for "ingrown hair". First noted about a month ago on right inner thigh.  Little redding irritated spot near hairline of GU region where she shaves.  Feeling irritated but no drainage noted. Tried cortisone cream but no help. Has had similar lesions in the region before but never this big and always quicker to heal. Has been in the heat more lately with watching long lacrosse matches.  Past Medical History:  Diagnosis Date   Bilateral lower extremity edema 2022   LE venous doppler u/s-->no DVT   COVID-19 virus infection 02/2020   Depression with anxiety 09/12/2012   With some PMS components   Hypercholesterolemia    Insomnia 09/12/2012   Overweight(278.02) 06/02/2012   Pes anserine bursitis 12/2016   Steroid injection by Dr. Tamala Julian 12/2016   RLS (restless legs syndrome) 09/12/2012   Vaginitis 06/02/2011   recurrent vulvovaginal candidiasis; hx of allergy/intolerance to OTC topical antifungal treatments.    Past Surgical History:  Procedure Laterality Date   chronc compartmenal syndrome     S/P ortho surgery x3   INTRAUTERINE DEVICE INSERTION  2017   Mirena    Outpatient Medications Prior to Visit  Medication Sig Dispense Refill   furosemide (LASIX) 20 MG tablet TAKE 1 TABLET BY MOUTH EVERY DAY AS NEEDED LOWER EXTREMITY SWELLING 30 tablet 1   levonorgestrel (MIRENA) 20 MCG/24HR IUD      rOPINIRole (REQUIP) 1 MG tablet TAKE 3 TABLETS BY MOUTH EVERY DAY AT BEDTIME 270 tablet 1   albuterol (VENTOLIN HFA) 108 (90 Base) MCG/ACT inhaler TAKE 2 PUFFS BY MOUTH EVERY 6 HOURS AS NEEDED FOR WHEEZE OR SHORTNESS OF BREATH (Patient not taking: Reported on 09/07/2020) 6.7 each 0   benzonatate (TESSALON PERLES) 100 MG capsule Take 1 capsule (100 mg total) by  mouth 3 (three) times daily as needed. (Patient not taking: Reported on 09/07/2020) 20 capsule 0   No facility-administered medications prior to visit.    Allergies  Allergen Reactions   Oxycodone-Acetaminophen Nausea And Vomiting   Codeine Nausea Only and Nausea And Vomiting    Syncope Syncope    Melatonin Anxiety    ROS As per HPI  PE: Vitals with BMI 09/07/2020 08/02/2020 01/29/2020  Height '5\' 4"'$  '5\' 4"'$  '5\' 4"'$   Weight 195 lbs 6 oz 195 lbs 6 oz 191 lbs  BMI 33.52 [id number]  Systolic [id number] [id number]  Diastolic 67 85 83  Pulse 68 67 65   Exam chaperoned by Deveron Furlong, CMA. R thigh posteromedial aspect with 1-2 cm pinkish oval subQ nodule w/out tenderness.  No pustule or vesicle.  No streaking or surrounding erythema.  LABS:    Chemistry      Component Value Date/Time   NA 138 08/02/2020 1353   K 3.8 08/02/2020 1353   CL 103 08/02/2020 1353   CO2 26 08/02/2020 1353   BUN 10 08/02/2020 1353   CREATININE 0.78 08/02/2020 1353   CREATININE 0.78 02/02/2011 1101      Component Value Date/Time   CALCIUM 9.5 08/02/2020 1353   ALKPHOS 61 08/02/2020 1353   AST 13 08/02/2020 1353   ALT 15 08/02/2020 1353   BILITOT 0.5 08/02/2020 1353      IMPRESSION AND  PLAN:  Furuncle of thigh, irritated and slow to heal d/t heat and sweat + friction from underwear. Will treat for possible staph infection->doxy 100 bid x 5d and bactroban ointment bid. Try to avoid undergarments that rub on the area, try to keep area cool and dry.  An After Visit Summary was printed and given to the patient.  FOLLOW UP: Return if symptoms worsen or fail to improve.  Signed:  Crissie Sickles, MD           09/07/2020

## 2020-09-29 ENCOUNTER — Other Ambulatory Visit: Payer: Self-pay | Admitting: Family Medicine

## 2020-10-08 ENCOUNTER — Encounter: Payer: Self-pay | Admitting: Family Medicine

## 2020-10-13 ENCOUNTER — Other Ambulatory Visit: Payer: Self-pay | Admitting: Family Medicine

## 2021-05-23 ENCOUNTER — Other Ambulatory Visit: Payer: Self-pay | Admitting: Family Medicine

## 2021-07-18 ENCOUNTER — Other Ambulatory Visit: Payer: Self-pay

## 2021-07-18 MED ORDER — ROPINIROLE HCL 1 MG PO TABS: ORAL_TABLET | ORAL | 0 refills | Status: DC

## 2021-07-18 NOTE — Telephone Encounter (Signed)
Pt was advised of appt, she will call back to schedule

## 2021-07-18 NOTE — Telephone Encounter (Signed)
Patient refill request.. Needs new presciption.   [facility] [place]  rOPINIRole (REQUIP) 1 MG tablet [[id number]]

## 2021-07-18 NOTE — Telephone Encounter (Signed)
Pt advised refill sent. °

## 2021-07-18 NOTE — Telephone Encounter (Signed)
Okay, 90-day supply sent. Needs office follow-up sometime in the next 3 months.

## 2021-07-18 NOTE — Telephone Encounter (Signed)
RF request for Ropinirole(Requip) LOV: 08/18/20 Next ov: N/A Last written: 01/29/20(270,1)  Please fill, if appropriate. Med pending

## 2021-10-14 ENCOUNTER — Other Ambulatory Visit: Payer: Self-pay | Admitting: Family Medicine

## 2021-10-20 LAB — HM PAP SMEAR

## 2021-10-20 LAB — HM MAMMOGRAPHY

## 2021-10-24 ENCOUNTER — Other Ambulatory Visit: Payer: Self-pay | Admitting: Obstetrics

## 2021-10-24 DIAGNOSIS — R928 Other abnormal and inconclusive findings on diagnostic imaging of breast: Secondary | ICD-10-CM

## 2021-11-03 ENCOUNTER — Ambulatory Visit: Payer: Self-pay

## 2021-11-03 ENCOUNTER — Ambulatory Visit
Admission: RE | Admit: 2021-11-03 | Discharge: 2021-11-03 | Disposition: A | Discharge status: Home / Self Care | Payer: No Typology Code available for payment source | Source: Ambulatory Visit | Attending: Obstetrics | Admitting: Obstetrics

## 2021-11-03 DIAGNOSIS — R928 Other abnormal and inconclusive findings on diagnostic imaging of breast: Secondary | ICD-10-CM

## 2022-01-09 ENCOUNTER — Ambulatory Visit: Payer: No Typology Code available for payment source | Admitting: Family Medicine

## 2022-01-11 ENCOUNTER — Ambulatory Visit (INDEPENDENT_AMBULATORY_CARE_PROVIDER_SITE_OTHER): Payer: Self-pay | Admitting: Family Medicine

## 2022-01-11 VITALS — BP 125/86 | HR 58 | Temp 98.4°F | Ht 64.0 in | Wt 201.0 lb

## 2022-01-11 DIAGNOSIS — L02426 Furuncle of left lower limb: Secondary | ICD-10-CM

## 2022-01-11 NOTE — Progress Notes (Signed)
OFFICE VISIT  01/11/2022  CC:  Chief Complaint  Patient presents with   Cyst on leg    Large bump on inner left thigh for 2 months, she is unsure if it is a cyst. She tried applying topical medication since it appeared but stopped due to ineffectiveness.    Patient is a 46 y.o. female who presents for cyst on leg.  HPI: Painful swelling focally in the left inner thigh for the last 2 months. About the size of a grape now.  Hurts quite a bit when she walks because it rubs against pants and opposite thigh.  No fever or malaise. She tried some topical antibiotics that her GYN doctor had given her for a similar lesion in the groin area.  The current lesion is quite a bit larger per patient report. The topical antibiotics have not helped this lesion at all.  No history of similar lesions in the axillae or on the scalp  Past Medical History:  Diagnosis Date   Bilateral lower extremity edema 2022   LE venous doppler u/s-->no DVT   COVID-19 virus infection 02/2020   Depression with anxiety 09/12/2012   With some PMS components   Hidradenitis suppurativa    per GYN MD   Hypercholesterolemia    Insomnia 09/12/2012   Overweight(278.02) 06/02/2012   Pes anserine bursitis 12/2016   Steroid injection by Dr. Tamala Julian 12/2016   RLS (restless legs syndrome) 09/12/2012   Vaginitis 06/02/2011   recurrent vulvovaginal candidiasis; hx of allergy/intolerance to OTC topical antifungal treatments.    Past Surgical History:  Procedure Laterality Date   chronc compartmenal syndrome     S/P ortho surgery x3   INTRAUTERINE DEVICE INSERTION  2017   Mirena    Outpatient Medications Prior to Visit  Medication Sig Dispense Refill   citalopram (CELEXA) 40 MG tablet Take 1 tablet by mouth daily.     furosemide (LASIX) 20 MG tablet TAKE 1 TABLET BY MOUTH EVERY DAY AS NEEDED LOWER EXTREMITY SWELLING 30 tablet 0   levonorgestrel (MIRENA) 20 MCG/24HR IUD      rOPINIRole (REQUIP) 1 MG tablet TAKE 3 TABLETS  BY MOUTH EVERY DAY AT BEDTIME 270 tablet 0   albuterol (VENTOLIN HFA) 108 (90 Base) MCG/ACT inhaler TAKE 2 PUFFS BY MOUTH EVERY 6 HOURS AS NEEDED FOR WHEEZE OR SHORTNESS OF BREATH (Patient not taking: Reported on 09/07/2020) 6.7 each 0   benzonatate (TESSALON PERLES) 100 MG capsule Take 1 capsule (100 mg total) by mouth 3 (three) times daily as needed. (Patient not taking: Reported on 09/07/2020) 20 capsule 0   mupirocin cream (BACTROBAN) 2 % Apply 1 application topically 2 (two) times daily. 15 g 0   No facility-administered medications prior to visit.    Allergies  Allergen Reactions   Oxycodone-Acetaminophen Nausea And Vomiting   Codeine Nausea Only and Nausea And Vomiting    Syncope Syncope    Melatonin Anxiety    ROS As per HPI  PE:    01/11/2022    3:55 PM 09/07/2020   11:37 AM 08/02/2020    8:38 AM  Vitals with BMI  Height '5\' 4"'$  '5\' 4"'$  '5\' 4"'$   Weight 201 lbs 195 lbs 6 oz 195 lbs 6 oz  BMI 34.48 51.02 58.52  Systolic 778 242 353  Diastolic 86 67 85  Pulse 58 68 67     Physical Exam  Exam chaperoned by Deveron Furlong, CMA. Left thigh, upper inner region with 2 to 3 cm oval nodular subcutaneous lesion  that is pink.  No drainage.  Some fluctuance is palpable in the center.  LABS:  Last CBC Lab Results  Component Value Date   WBC 8.2 08/02/2020   HGB 13.1 08/02/2020   HCT 39.6 08/02/2020   MCV 79.7 08/02/2020   MCH 26.3 02/02/2011   RDW 13.5 08/02/2020   PLT 301.0 16/57/9038   Last metabolic panel Lab Results  Component Value Date   GLUCOSE 70 08/02/2020   NA 138 08/02/2020   K 3.8 08/02/2020   CL 103 08/02/2020   CO2 26 08/02/2020   BUN 10 08/02/2020   CREATININE 0.78 08/02/2020   CALCIUM 9.5 08/02/2020   PHOS 3.5 01/22/2014   PROT 7.5 08/02/2020   ALBUMIN 4.4 08/02/2020   BILITOT 0.5 08/02/2020   ALKPHOS 61 08/02/2020   AST 13 08/02/2020   ALT 15 08/02/2020   IMPRESSION AND PLAN:  Furuncle left thigh. Discussed incision and drainage today  and patient elected to have this done. Procedure: Incision and drainage of abscess/cyst.  The indication for the procedure was explained to the patient, benefits and risks of procedure were outlined for patient, patient agreed to proceed.  Steps of the procedure were clearly explained to the patient prior to starting. Injected lesion with 3 ml of 1% lidocaine plus epinephrine for local anesthesia.  Incised central portion of lesion with scalpel and used manual pressure and hemostats to express contents and encourage complete drainage.  Wound dressed.  No bleeding.  Patient tolerated procedure well.  No immediate complications.  Wound care instructions given.  Warning signs of infection discussed. Follow up discussed.  Call or return for problems.  An After Visit Summary was printed and given to the patient.  FOLLOW UP: Return for 7-8 days f/u thigh cyst.  Signed:  Crissie Sickles, MD           01/11/2022

## 2022-01-16 ENCOUNTER — Ambulatory Visit: Payer: No Typology Code available for payment source | Admitting: Family Medicine

## 2022-01-25 ENCOUNTER — Ambulatory Visit (INDEPENDENT_AMBULATORY_CARE_PROVIDER_SITE_OTHER): Payer: Self-pay | Admitting: Family Medicine

## 2022-01-25 ENCOUNTER — Encounter: Payer: Self-pay | Admitting: Family Medicine

## 2022-01-25 VITALS — BP 112/75 | HR 54 | Temp 98.0°F | Ht 64.0 in | Wt 198.8 lb

## 2022-01-25 DIAGNOSIS — Z131 Encounter for screening for diabetes mellitus: Secondary | ICD-10-CM

## 2022-01-25 DIAGNOSIS — Z13220 Encounter for screening for lipoid disorders: Secondary | ICD-10-CM

## 2022-01-25 DIAGNOSIS — Z0000 Encounter for general adult medical examination without abnormal findings: Secondary | ICD-10-CM

## 2022-01-25 MED ORDER — FUROSEMIDE 20 MG PO TABS: ORAL_TABLET | ORAL | 1 refills | Status: DC

## 2022-01-25 MED ORDER — ROPINIROLE HCL 1 MG PO TABS: ORAL_TABLET | ORAL | 3 refills | Status: AC

## 2022-01-25 NOTE — Progress Notes (Signed)
OFFICE VISIT  01/25/2022  CC:  Chief Complaint  Patient presents with   Annual Exam    Pt is fasting    Patient is a 46 y.o. female who presents for health maintenance exam and 2-week follow-up incision and drainage of left thigh furuncle.  INTERIM HX: Left thigh just started to improve regarding pain.  It has sealed up and feels firm. At her for quite a while after the procedure.  She is frustrated with her inability to lose weight.  She cannot do vigorous calorie-burning exercise due to a history of recurrent foot drop when running.  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  She admits that she has not doing a great job at calorie limitation.  Past Medical History:  Diagnosis Date   Bilateral lower extremity edema 2022   LE venous doppler u/s-->no DVT   COVID-19 virus infection 02/2020   Depression with anxiety 09/12/2012   With some PMS components   Hidradenitis suppurativa    per GYN MD   Hypercholesterolemia    Insomnia 09/12/2012   Overweight(278.02) 06/02/2012   Pes anserine bursitis 12/2016   Steroid injection by Dr. Tamala Julian 12/2016   RLS (restless legs syndrome) 09/12/2012   Vaginitis 06/02/2011   recurrent vulvovaginal candidiasis; hx of allergy/intolerance to OTC topical antifungal treatments.    Past Surgical History:  Procedure Laterality Date   chronc compartmenal syndrome     S/P ortho surgery x3   INTRAUTERINE DEVICE INSERTION  2017   Mirena   Family History  Problem Relation Age of Onset   Hyperlipidemia Mother    Heart disease Mother        palpitation   Hyperlipidemia Father    Hypertension Father    Heart disease Father        s/p bypass and stent, passed from MI 06/24/14   Otitis media Daughter        recurrent strep   Cancer Maternal Aunt        breast   Cancer Maternal Grandfather        possibly stomach   Aneurysm Paternal Grandmother        likely brain   Cancer Paternal  Grandmother        breast   Otitis media Daughter    Coronary artery disease Unknown    Hypertension Unknown    Social History   Socioeconomic History   Marital status: Married    Spouse name: Not on file   Number of children: Not on file   Years of education: Not on file   Highest education level: Bachelor's degree (e.g., BA, AB, BS)  Occupational History   Not on file  Tobacco Use   Smoking status: Never   Smokeless tobacco: Never  Substance and Sexual Activity   Alcohol use: Yes   Drug use: No   Sexual activity: Yes    Birth control/protection: Pill  Other Topics Concern   Not on file  Social History Narrative   Not on file   Social Determinants of Health   Financial Resource Strain: Low Risk  (01/11/2022)   Overall Financial Resource Strain (CARDIA)    Difficulty of Paying Living Expenses: Not very hard  Food Insecurity: No Food Insecurity (01/11/2022)   Hunger Vital Sign    Worried About Running Out of Food in the Last Year: Never true    Ran Out of Food in the Last Year: Never true  Transportation Needs: No Transportation Needs (  01/11/2022)   PRAPARE - Hydrologist (Medical): No    Lack of Transportation (Non-Medical): No  Physical Activity: Unknown (01/11/2022)   Exercise Vital Sign    Days of Exercise per Week: Patient refused    Minutes of Exercise per Session: Not on file  Stress: Stress Concern Present (01/11/2022)   [place]    Feeling of Stress : To some extent  Social Connections: Socially Integrated (01/11/2022)   Social Connection and Isolation Panel [NHANES]    Frequency of Communication with Friends and Family: More than three times a week    Frequency of Social Gatherings with Friends and Family: Once a week    Attends Religious Services: More than 4 times per year    Active Member of [employer] or Organizations: Yes    Attends Archivist  Meetings: 1 to 4 times per year    Marital Status: Married    Outpatient Medications Prior to Visit  Medication Sig Dispense Refill   citalopram (CELEXA) 40 MG tablet Take 1 tablet by mouth daily.     furosemide (LASIX) 20 MG tablet TAKE 1 TABLET BY MOUTH EVERY DAY AS NEEDED LOWER EXTREMITY SWELLING 30 tablet 0   levonorgestrel (MIRENA) 20 MCG/24HR IUD      rOPINIRole (REQUIP) 1 MG tablet TAKE 3 TABLETS BY MOUTH EVERY DAY AT BEDTIME 270 tablet 0   No facility-administered medications prior to visit.    Allergies  Allergen Reactions   Oxycodone-Acetaminophen Nausea And Vomiting   Codeine Nausea Only and Nausea And Vomiting    Syncope Syncope    Melatonin Anxiety    ROS As per HPI  PE:    01/25/2022    3:24 PM 01/11/2022    3:55 PM 09/07/2020   11:37 AM  Vitals with BMI  Height '5\' 4"'$  '5\' 4"'$  '5\' 4"'$   Weight 198 lbs 13 oz 201 lbs 195 lbs 6 oz  BMI 34.11 38.75 64.33  Systolic 295 188 416  Diastolic 75 86 67  Pulse 54 58 68     Physical Exam  Exam chaperoned by Deveron Furlong, CMA. Gen: Alert, well appearing.  Patient is oriented to person, place, time, and situation. AFFECT: pleasant, lucid thought and speech. ENT: Ears: EACs clear, normal epithelium.  TMs with good light reflex and landmarks bilaterally.  Eyes: no injection, icteris, swelling, or exudate.  EOMI, PERRLA. Nose: no drainage or turbinate edema/swelling.  No injection or focal lesion.  Mouth: lips without lesion/swelling.  Oral mucosa pink and moist.  Dentition intact and without obvious caries or gingival swelling.  Oropharynx without erythema, exudate, or swelling.  Neck: supple/nontender.  No LAD, mass, or TM.  Carotid pulses 2+ bilaterally, without bruits. CV: RRR, no m/r/g.   LUNGS: CTA bilat, nonlabored resps, good aeration in all lung fields. ABD: soft, NT, ND, BS normal.  No hepatospenomegaly or mass.  No bruits. EXT: no clubbing, cyanosis, or edema.  Musculoskeletal: no joint swelling, erythema,  warmth, or tenderness.  ROM of all joints intact. Skin - no sores or suspicious lesions or rashes or color changes   LABS:  Last CBC Lab Results  Component Value Date   WBC 8.2 08/02/2020   HGB 13.1 08/02/2020   HCT 39.6 08/02/2020   MCV 79.7 08/02/2020   MCH 26.3 02/02/2011   RDW 13.5 08/02/2020   PLT 301.0 08/02/2020   Lab Results  Component Value Date   IRON 95 01/29/2020  TIBC 289 01/29/2020   FERRITIN 26 46/28/6381   Last metabolic panel Lab Results  Component Value Date   GLUCOSE 70 08/02/2020   NA 138 08/02/2020   K 3.8 08/02/2020   CL 103 08/02/2020   CO2 26 08/02/2020   BUN 10 08/02/2020   CREATININE 0.78 08/02/2020   CALCIUM 9.5 08/02/2020   PHOS 3.5 01/22/2014   PROT 7.5 08/02/2020   ALBUMIN 4.4 08/02/2020   BILITOT 0.5 08/02/2020   ALKPHOS 61 08/02/2020   AST 13 08/02/2020   ALT 15 08/02/2020   Lab Results  Component Value Date   TSH 1.38 01/29/2020   Lab Results  Component Value Date   CHOL 192 01/29/2020   HDL 49.80 01/29/2020   LDLCALC 111 (H) 01/29/2020   LDLDIRECT 98.0 07/31/2014   TRIG 159.0 (H) 01/29/2020   CHOLHDL 4 01/29/2020   IMPRESSION AND PLAN:  Health maintenance exam: Reviewed age and gender appropriate health maintenance issues (prudent diet, regular exercise, health risks of tobacco and excessive alcohol, use of seatbelts, fire alarms in home, use of sunscreen).  Also reviewed age and gender appropriate health screening as well as vaccine recommendations. Vaccines: UTD Labs: fasting HP labs ordered Cervical ca screening: GYN MD, 09/2021 UTD Breast ca screening: mammogram UTD 09/2021. Colon ca screening: due for initial screening.  Options discussed->pt declined.  An After Visit Summary was printed and given to the patient.  FOLLOW UP: Return in about 1 year (around 01/26/2023) for annual CPE (fasting).  Signed:  Crissie Sickles, MD           01/25/2022

## 2022-01-25 NOTE — Patient Instructions (Signed)

## 2022-01-26 LAB — LIPID PANEL
Cholesterol: 198 mg/dL (ref 0–200)
HDL: 47.8 mg/dL (ref >39.00)
LDL Cholesterol: 125 mg/dL — ABNORMAL HIGH (ref 0–99)
NonHDL: 150.03
Total CHOL/HDL Ratio: 4
Triglycerides: 125 mg/dL (ref 0.0–149.0)
VLDL: 25 mg/dL (ref 0.0–40.0)

## 2022-01-26 LAB — COMPREHENSIVE METABOLIC PANEL
ALT: 12 U/L (ref 0–35)
AST: 14 U/L (ref 0–37)
Albumin: 4.4 g/dL (ref 3.5–5.2)
Alkaline Phosphatase: 66 U/L (ref 39–117)
BUN: 9 mg/dL (ref 6–23)
CO2: 29 mEq/L (ref 19–32)
Calcium: 9.4 mg/dL (ref 8.4–10.5)
Chloride: 103 mEq/L (ref 96–112)
Creatinine, Ser: 0.86 mg/dL (ref 0.40–1.20)
GFR: 81.65 mL/min (ref >60.00)
Glucose, Bld: 87 mg/dL (ref 70–99)
Potassium: 4.1 mEq/L (ref 3.5–5.1)
Sodium: 138 mEq/L (ref 135–145)
Total Bilirubin: 0.4 mg/dL (ref 0.2–1.2)
Total Protein: 7.5 g/dL (ref 6.0–8.3)

## 2022-01-26 LAB — CBC
HCT: 40.7 % (ref 36.0–46.0)
Hemoglobin: 13.3 g/dL (ref 12.0–15.0)
MCHC: 32.8 g/dL (ref 30.0–36.0)
MCV: 80.3 fl (ref 78.0–100.0)
Platelets: 329 10*3/uL (ref 150.0–400.0)
RBC: 5.06 Mil/uL (ref 3.87–5.11)
RDW: 13.7 % (ref 11.5–15.5)
WBC: 9.2 10*3/uL (ref 4.0–10.5)

## 2022-01-26 LAB — TSH: TSH: 1.34 u[IU]/mL (ref 0.35–5.50)

## 2022-01-26 LAB — HEMOGLOBIN A1C: Hgb A1c MFr Bld: 5.8 % (ref 4.6–6.5)

## 2022-02-26 ENCOUNTER — Other Ambulatory Visit: Payer: Self-pay | Admitting: Family Medicine

## 2022-03-10 IMAGING — US US EXTREM LOW VENOUS
1 series · 13 of 24 positions shown · non-contrast
Comparison: None.

CLINICAL DATA: 44-year-old with bilateral lower extremity edema.



[Series 1: us venous img lower bilat (dvt) · portal-venous · 13 of 57 slices shown]
[im 1/57]
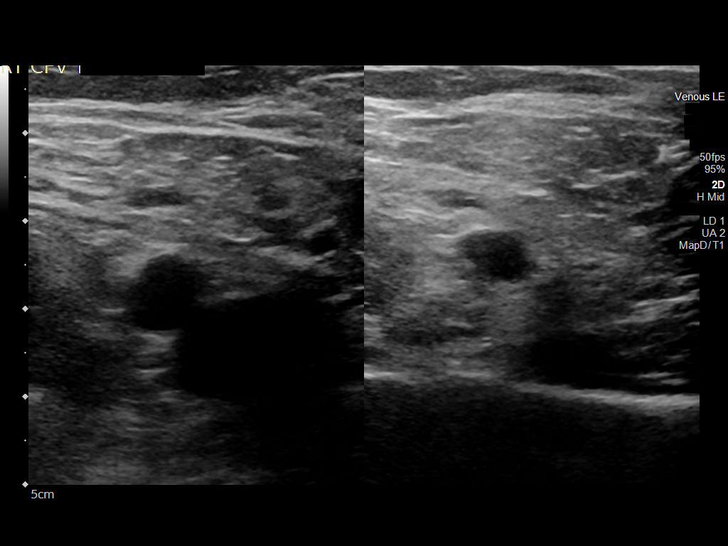
[im 5/57]
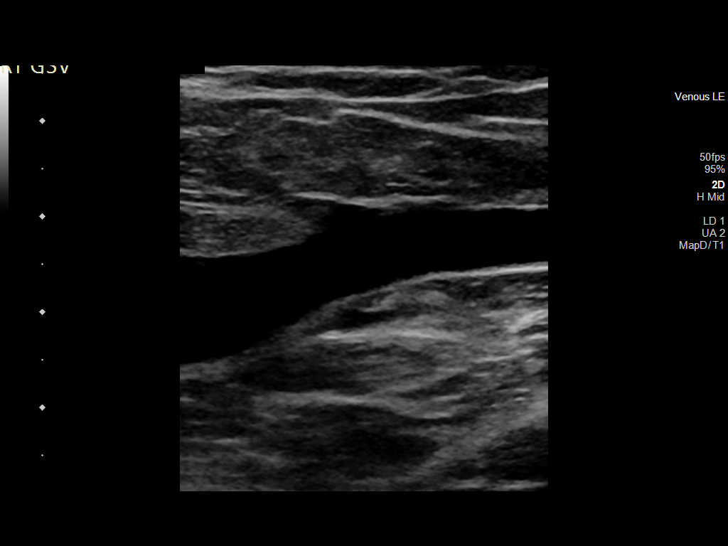
[im 10/57]
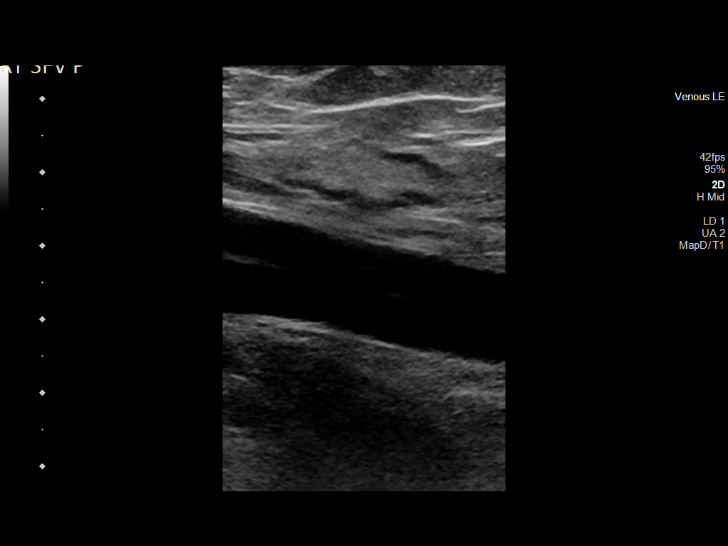
[im 15/57]
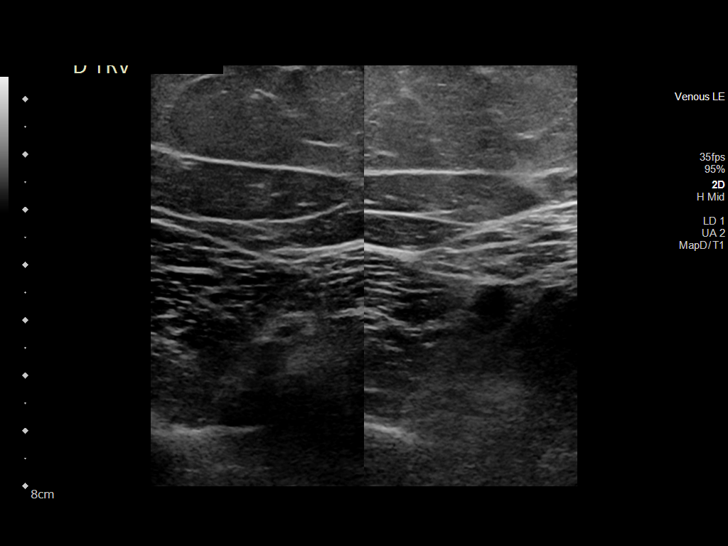
[im 20/57]
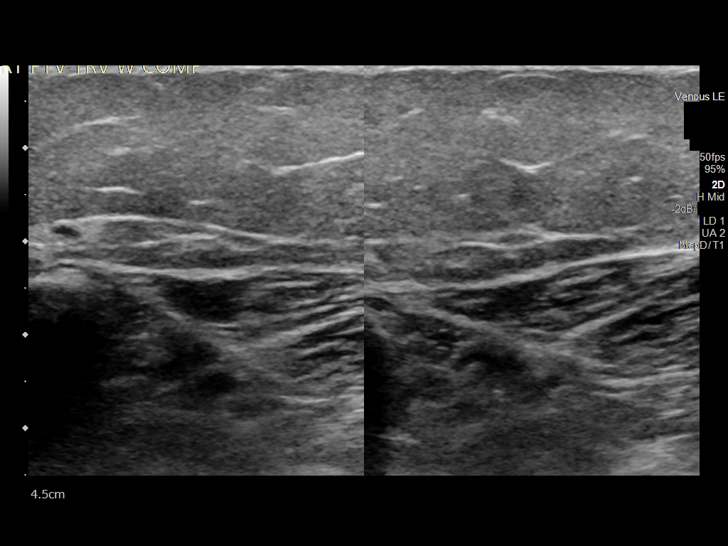
[im 25/57]
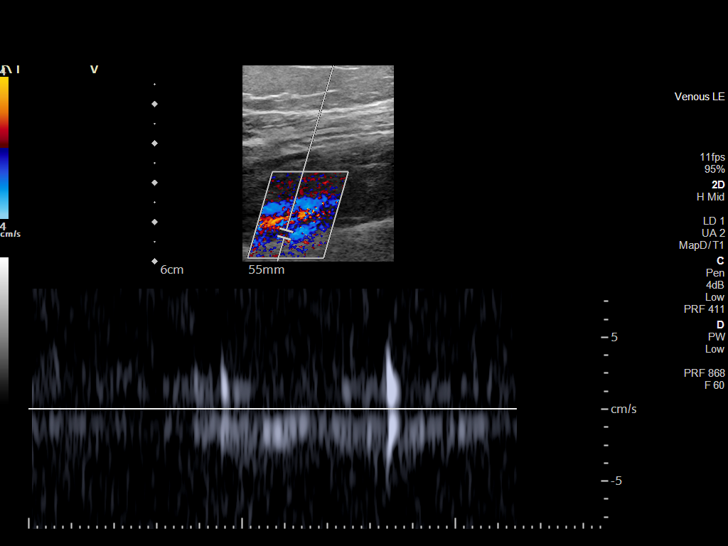
[im 30/57]
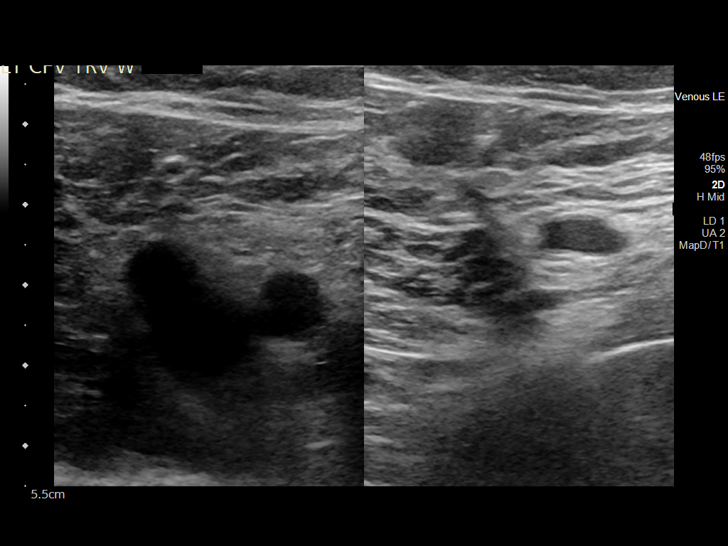
[im 32/57]
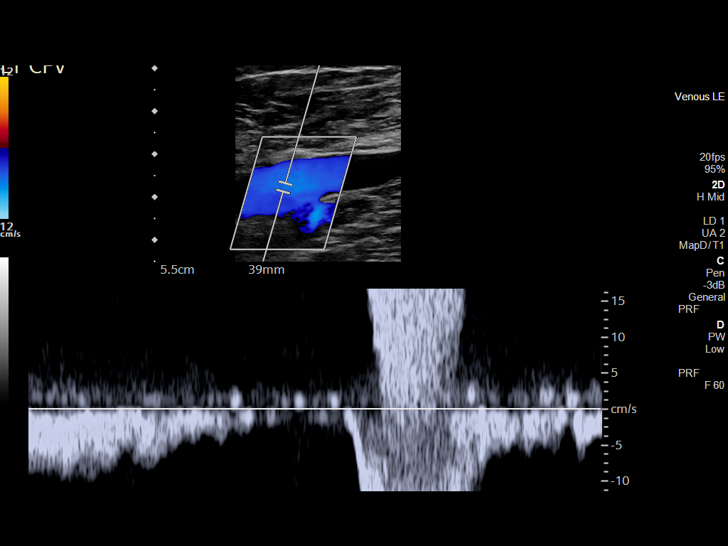
[im 37/57]
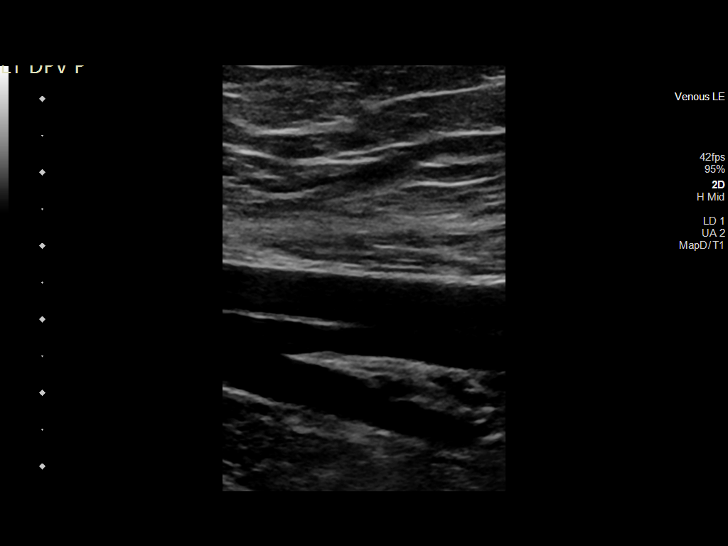
[im 42/57]
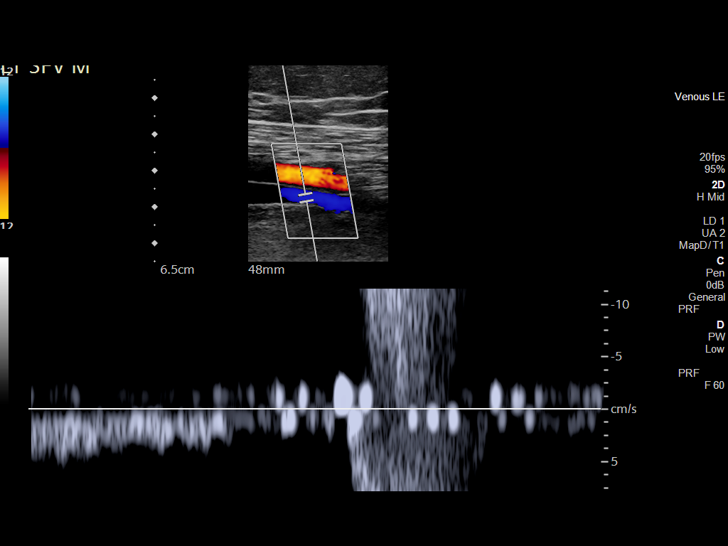
[im 47/57]
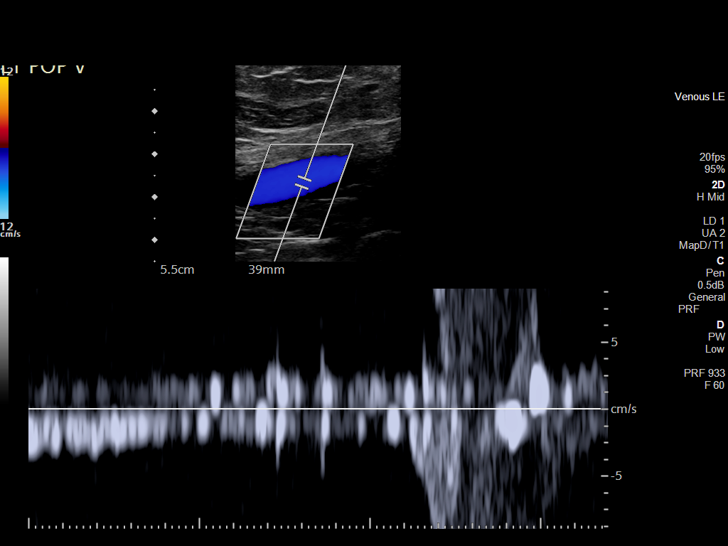
[im 52/57]
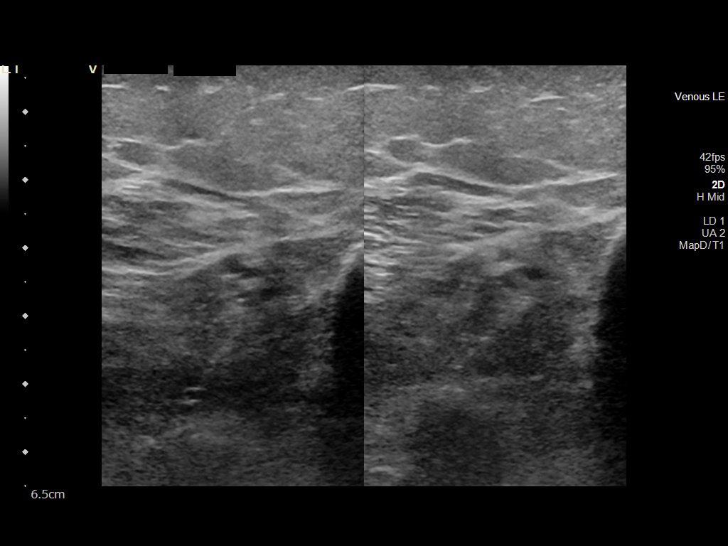
[im 57/57]
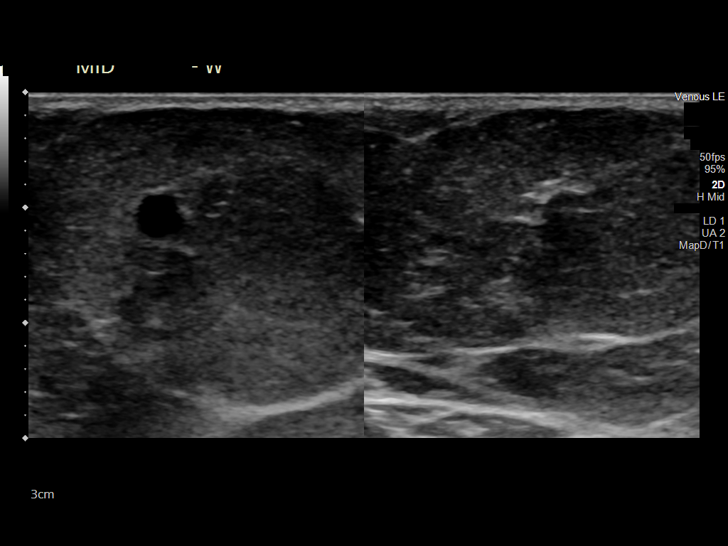

[13 of 24 positions shown; findings below may reference images not displayed]

FINDINGS: RIGHT LOWER EXTREMITY

Common Femoral Vein: No evidence of thrombus. Normal
compressibility, respiratory phasicity and response to augmentation.

Saphenofemoral Junction: No evidence of thrombus. Normal
compressibility and flow on color Doppler imaging.

Profunda Femoral Vein: No evidence of thrombus. Normal
compressibility and flow on color Doppler imaging.

Femoral Vein: No evidence of thrombus. Normal compressibility,
respiratory phasicity and response to augmentation.

Popliteal Vein: No evidence of thrombus. Normal compressibility,
respiratory phasicity and response to augmentation.

Calf Veins: No evidence of thrombus. Normal compressibility and flow
on color Doppler imaging.

Superficial Great Saphenous Vein: No evidence of thrombus. Normal
compressibility.

LEFT LOWER EXTREMITY

Common Femoral Vein: No evidence of thrombus. Normal
compressibility, respiratory phasicity and response to augmentation.

Saphenofemoral Junction: No evidence of thrombus. Normal
compressibility and flow on color Doppler imaging.

Profunda Femoral Vein: No evidence of thrombus. Normal
compressibility and flow on color Doppler imaging.

Femoral Vein: No evidence of thrombus. Normal compressibility,
respiratory phasicity and response to augmentation.

Popliteal Vein: No evidence of thrombus. Normal compressibility,
respiratory phasicity and response to augmentation.

Calf Veins: No evidence of thrombus. Normal compressibility and flow
on color Doppler imaging.

Superficial Great Saphenous Vein: No evidence of thrombus. Normal
compressibility.

Other Findings:  None.
IMPRESSION: No evidence of deep venous thrombosis in either lower extremity.

## 2022-03-12 LAB — CBC AND DIFFERENTIAL
HCT: 42 (ref 36–46)
Hemoglobin: 13.4 (ref 12.0–16.0)
Neutrophils Absolute: 12.24
Platelets: 283 10*3/uL (ref 150–400)
WBC: 14.5

## 2022-03-12 LAB — BASIC METABOLIC PANEL
BUN: 10 (ref 4–21)
CO2: 33 — AB (ref 13–22)
Chloride: 102 (ref 99–108)
Creatinine: 0.9 (ref 0.5–1.1)
Glucose: 136
Potassium: 3.6 mEq/L (ref 3.5–5.1)
Sodium: 140 (ref 137–147)

## 2022-03-12 LAB — HEPATIC FUNCTION PANEL
ALT: 31 U/L (ref 7–35)
AST: 46 — AB (ref 13–35)
Alkaline Phosphatase: 92 (ref 25–125)

## 2022-03-12 LAB — COMPREHENSIVE METABOLIC PANEL
Albumin: 4.4 (ref 3.5–5.0)
Calcium: 9.5 (ref 8.7–10.7)
Globulin: 3.6
eGFR: 84

## 2022-03-12 LAB — CBC: RBC: 5.1 (ref 3.87–5.11)

## 2022-03-15 ENCOUNTER — Telehealth: Payer: Self-pay

## 2022-03-15 NOTE — Telephone Encounter (Signed)
Transition Care Management Follow-up Telephone Call Date of discharge and from where: [facility] 03/12/22 How have you been since you were released from the hospital? Doing better Any questions or concerns? No  Items Reviewed: Did the pt receive and understand the discharge instructions provided? Yes  Medications obtained and verified? No  Other? No  Any new allergies since your discharge? No  Dietary orders reviewed? Yes Do you have support at home? Yes   Home Care and Equipment/Supplies: Were home health services ordered? not applicable If so, what is the name of the agency? N/A  Has the agency set up a time to come to the patient's home? not applicable Were any new equipment or medical supplies ordered?  No What is the name of the medical supply agency? N/A Were you able to get the supplies/equipment? not applicable Do you have any questions related to the use of the equipment or supplies? No  Functional Questionnaire: (I = Independent and D = Dependent) ADLs: I  Bathing/Dressing- I  Meal Prep- I  Eating- I  Maintaining continence- I  Transferring/Ambulation- I  Managing Meds- I  Follow up appointments reviewed:  PCP Hospital f/u appt confirmed? Yes  Scheduled to see PCP on 03/17/22 @ 11:20. [facility] f/u appt confirmed? No   Are transportation arrangements needed? No  If their condition worsens, is the pt aware to call PCP or go to the Emergency Dept.? Yes Was the patient provided with contact information for the PCP's office or ED? Yes Was to pt encouraged to call back with questions or concerns? Yes

## 2022-03-17 ENCOUNTER — Ambulatory Visit (INDEPENDENT_AMBULATORY_CARE_PROVIDER_SITE_OTHER): Payer: Self-pay | Admitting: Family Medicine

## 2022-03-17 ENCOUNTER — Encounter: Payer: Self-pay | Admitting: Family Medicine

## 2022-03-17 VITALS — BP 118/82 | HR 53 | Temp 98.4°F | Ht 64.0 in | Wt 196.4 lb

## 2022-03-17 DIAGNOSIS — K529 Noninfective gastroenteritis and colitis, unspecified: Secondary | ICD-10-CM

## 2022-03-17 DIAGNOSIS — R110 Nausea: Secondary | ICD-10-CM

## 2022-03-17 DIAGNOSIS — R1013 Epigastric pain: Secondary | ICD-10-CM

## 2022-03-17 MED ORDER — SUCRALFATE 1 G PO TABS: ORAL_TABLET | ORAL | 0 refills | Status: AC

## 2022-03-17 MED ORDER — PANTOPRAZOLE SODIUM 40 MG PO TBEC: 40.0000 mg | DELAYED_RELEASE_TABLET | Freq: Every day | ORAL | 0 refills | Status: AC

## 2022-03-17 NOTE — Patient Instructions (Signed)
Jeannette at [facility] [place],  [place]  [id number] [address]: [phone]   They will call you to schedule for the ultrasound.

## 2022-03-17 NOTE — Progress Notes (Signed)
OFFICE VISIT  03/17/2022  CC:  Chief Complaint  Patient presents with   Hospitalization Follow-up    Patient is a 47 y.o. female who presents for emergency department follow-up for epigastric pain.  INTERIM HX: Shenea presented to [facility] emergency department on 03/12/2022 for acute onset of upper abdominal pain, vomiting, and diarrhea.  I reviewed this entire encounter today. Her vital signs were normal.  CBC showed a white blood cell count of 14.5 with neutrophil 85%, otherwise normal.  Complete metabolic panel was normal.  Lipase 26, troponin T 5. CT abdomen and pelvis with IV contrast was normal.  EKG was normal. There is no data specifying any specific diagnoses given, treatments that she was given in the emergency department, or any data about discharge medications.  UPDATE:  Patient recounts her HPI today: 5 days ago she woke up early in the morning with nausea and vomiting.  She is then started to have some diarrhea and pain in the mid epigastric region.  She then started to have a worsening of the abdominal pain to the point of doubling over (and then felt the pain rise to her chest) and it was not relenting so she went to the emergency department. (See above).  No recent sick contacts.  She ate some spinach dip the night before but otherwise no new or suspicious foods.  She did not have fever.  In the emergency department she was given a dose of morphine, Pepcid, and antiemetic.  She was discharged home on no medications but she did pick up some Pepcid over-the-counter which she took once. She has continued to have some nausea with decreased appetite but no vomiting.  She is eating mostly liquid diet, some bland foods.  Still has a mild constant amount of discomfort in the midepigastric region, milder to the left upper quadrant and right upper quadrant. No dysuria, urinary urgency, urinary frequency, flank pain, or gross hematuria.  ROS as above, plus-->  no fevers,no wheezing, no cough, no rashes, no melena/hematochezia.  No polyuria or polydipsia.  No myalgias or arthralgias.  No focal weakness, paresthesias, or tremors.  No acute vision or hearing abnormalities.   No recent changes in lower legs.   No palpitations.    Past Medical History:  Diagnosis Date   Bilateral lower extremity edema 2022   LE venous doppler u/s-->no DVT   COVID-19 virus infection 02/2020   Depression with anxiety 09/12/2012   With some PMS components   Hidradenitis suppurativa    per GYN MD   Hypercholesterolemia    Insomnia 09/12/2012   Nephrolithiasis    02/2022 CT abd/pelv with-->nonobstructing 2-3 mm left lower pole calculi.   Overweight(278.02) 06/02/2012   Pes anserine bursitis 12/2016   Steroid injection by Dr. Tamala Julian 12/2016   RLS (restless legs syndrome) 09/12/2012   Vaginitis 06/02/2011   recurrent vulvovaginal candidiasis; hx of allergy/intolerance to OTC topical antifungal treatments.    Past Surgical History:  Procedure Laterality Date   chronc compartmenal syndrome     S/P ortho surgery x3   INTRAUTERINE DEVICE INSERTION  2017   Mirena    Outpatient Medications Prior to Visit  Medication Sig Dispense Refill   citalopram (CELEXA) 40 MG tablet Take 1 tablet by mouth daily.     furosemide (LASIX) 20 MG tablet TAKE 1 TABLET BY MOUTH EVERY DAY AS NEEDED SWELLING 90 tablet 1   levonorgestrel (MIRENA) 20 MCG/24HR IUD      rOPINIRole (REQUIP) 1 MG tablet  TAKE 3 TABLETS BY MOUTH EVERY DAY AT BEDTIME 270 tablet 3   Almotriptan Malate (AXERT PO) Take by mouth as needed.       fluticasone (FLONASE) 50 MCG/ACT nasal spray Place 1 spray into the nose 2 (two) times daily. 16 g 1   Mometasone Furo-Formoterol Fum 200-5 MCG/ACT AERO 1-2  inhalations every 12 hours; gargle and spit after use 8.8 g 0   topiramate (TOPAMAX) 50 MG tablet Take by mouth 2 (two) times daily. Take 3 tab  At bedtime for migraines      No facility-administered medications prior to  visit.    Allergies  Allergen Reactions   Oxycodone-Acetaminophen Nausea And Vomiting   Codeine Nausea Only and Nausea And Vomiting    Syncope Syncope    Melatonin Anxiety    Review of Systems As per HPI  PE:    03/17/2022   11:21 AM 01/25/2022    3:24 PM 01/11/2022    3:55 PM  Vitals with BMI  Height '5\' 4"'$  '5\' 4"'$  '5\' 4"'$   Weight 196 lbs 6 oz 198 lbs 13 oz 201 lbs  BMI 33.7 56.25 63.89  Systolic 373 428 768  Diastolic 82 75 86  Pulse 53 54 58   Physical Exam  Exam chaperoned by Deveron Furlong, CMA. Gen: Alert, well appearing.  Patient is oriented to person, place, time, and situation. AFFECT: pleasant, lucid thought and speech. Vascular: Regular rhythm and rate without murmur Lungs are clear bilaterally, breathing nonlabored. Abdomen: Soft, moderate tenderness to palpation in the midepigastric region as well as left upper quadrant and right upper quadrant.  No guarding or rebound.  Bowel sounds are a little quiet.  She is not distended.  No bruit.  No mass or hepatosplenomegaly is palpable.  No lower abdominal pain or flank pain or chest wall pain.   LABS:  Last CBC Lab Results  Component Value Date   WBC 14.5 03/12/2022   HGB 13.4 03/12/2022   HCT 42 03/12/2022   MCV 80.3 01/25/2022   MCH 26.3 02/02/2011   RDW 13.7 01/25/2022   PLT 283 11/57/2620   Last metabolic panel Lab Results  Component Value Date   GLUCOSE 87 01/25/2022   NA 140 03/12/2022   K 3.6 03/12/2022   CL 102 03/12/2022   CO2 33 (A) 03/12/2022   BUN 10 03/12/2022   CREATININE 0.9 03/12/2022   EGFR 84 03/12/2022   CALCIUM 9.5 03/12/2022   PHOS 3.5 01/22/2014   PROT 7.5 01/25/2022   ALBUMIN 4.4 03/12/2022   BILITOT 0.4 01/25/2022   ALKPHOS 92 03/12/2022   AST 46 (A) 03/12/2022   ALT 31 03/12/2022      IMPRESSION AND PLAN:  #1 epigastric pain.  She had some gastroenteritis symptoms at the onset, favoring infectious etiology.  Abdominal CT with contrast without any finding to explain  her pain. She could have had some esophageal spasm as well as some gastritis. She has some pretty significant ongoing symptoms. Will get right upper quadrant abdominal ultrasound to further evaluate for gallstones. In the meantime, I recommended she start pantoprazole 40 mg a day and take 40 mg of over-the-counter Pepcid every evening.  Carafate 1 g 3 times daily prescribed as well.  Reassured patient regarding her subcentimeter liver lesions--suspect cysts versus hemangiomas. These will be further characterized on her ultrasound.  An After Visit Summary was printed and given to the patient.  FOLLOW UP: Return for 10-14d f/u abd pain.  Signed:  Crissie Sickles, MD  03/17/2022   

## 2022-03-17 NOTE — Patient Instructions (Signed)
Take one of the pantoprazole tabs I prescribed you every morning. Take a '40mg'$  dose of over the counter pepcid every evening.

## 2022-03-22 ENCOUNTER — Ambulatory Visit (HOSPITAL_BASED_OUTPATIENT_CLINIC_OR_DEPARTMENT_OTHER)
Admission: RE | Admit: 2022-03-22 | Discharge: 2022-03-22 | Disposition: A | Discharge status: Home / Self Care | Payer: Self-pay | Source: Ambulatory Visit | Attending: Family Medicine | Admitting: Family Medicine

## 2022-03-22 ENCOUNTER — Encounter: Payer: Self-pay | Admitting: Family Medicine

## 2022-03-22 ENCOUNTER — Other Ambulatory Visit: Payer: Self-pay | Admitting: Family Medicine

## 2022-03-22 DIAGNOSIS — R1013 Epigastric pain: Secondary | ICD-10-CM | POA: Insufficient documentation

## 2022-03-22 DIAGNOSIS — K8020 Calculus of gallbladder without cholecystitis without obstruction: Secondary | ICD-10-CM

## 2022-03-23 ENCOUNTER — Encounter: Payer: Self-pay | Admitting: Family Medicine

## 2022-03-23 ENCOUNTER — Telehealth: Payer: Self-pay | Admitting: Family Medicine

## 2022-03-23 NOTE — Telephone Encounter (Signed)
Pt advised appt canceled.

## 2022-03-23 NOTE — Telephone Encounter (Signed)
It is okay for her to cancel this appt.

## 2022-03-23 NOTE — Telephone Encounter (Signed)
Patient is self pay and I advised her that medical assistants suggested she keep her scheduled for the 2/13. She would like a call to know if it is medically necessary she keep the appt since she has to pay out of pocket. Please give the patient a call.

## 2022-03-23 NOTE — Telephone Encounter (Signed)
Pt was seen 2/2 for abd pain. Advised to FOLLOW UP: Return for 10-14d f/u abd pain.   Please advise

## 2022-03-28 ENCOUNTER — Ambulatory Visit: Payer: Self-pay | Admitting: Family Medicine

## 2022-04-18 ENCOUNTER — Ambulatory Visit: Payer: Self-pay | Admitting: Surgery

## 2022-04-18 NOTE — H&P (View-Only) (Signed)
History of Present Illness: Madeline Murphy is a 46 y.o. female who was referred to me for evaluation of gallstones. In late January she woke up with nausea/vomiting and severe epigastric pain. She was seen in the ED, and reportedly had a CT scan that did not show any acute abnormalities (we do not have the imaging). LFTs and lipase were normal. She then followed up with her PCP and was referred for a RUQ US on 2/7, which showed gallstones. She was referred to discuss surgery. She has continued to have discomfort and pressure in the epigastric area, although no pain as severe as she had previously. She also has frequent nausea. No fevers or chills. She has noticed that eating chocolate makes her symptoms worse.   She has not had any prior abdominal surgeries.     Review of Systems: A complete review of systems was obtained from the patient.  I have reviewed this information and discussed as appropriate with the patient.  See HPI as well for other ROS.     Medical History: Past Medical History Past Medical History: Diagnosis Date  Anxiety        There is no problem list on file for this patient.     Past Surgical History History reviewed. No pertinent surgical history.     Allergies Allergies Allergen Reactions  Melatonin Anxiety      Current Outpatient Medications on File Prior to Visit Medication Sig Dispense Refill  FUROsemide (LASIX) 20 MG tablet TAKE 1 TABLET BY MOUTH EVERY DAY AS NEEDED SWELLING      citalopram (CELEXA) 40 MG tablet Take 40 mg by mouth once daily      levonorgestreL (MIRENA 52 MG) IUD         No current facility-administered medications on file prior to visit.     Family History Family History Problem Relation Age of Onset  Obesity Father    High blood pressure (Hypertension) Father    Hyperlipidemia (Elevated cholesterol) Father    Coronary Artery Disease (Blocked arteries around heart) Father        Social History   Tobacco Use Smoking  Status Never Smokeless Tobacco Never     Social History Social History    Socioeconomic History  Marital status: Married Tobacco Use  Smoking status: Never  Smokeless tobacco: Never Substance and Sexual Activity  Alcohol use: Yes  Drug use: Never      Objective:     Vitals:   04/18/22 0902 BP: 124/72 Pulse: 70 Temp: 36.6 C (97.9 F) SpO2: 98% Weight: 87.9 kg (193 lb 12.8 oz) Height: 162.6 cm (5' 4")   Body mass index is 33.27 kg/m.   Physical Exam Vitals reviewed.  Constitutional:      General: She is not in acute distress.    Appearance: Normal appearance.  HENT:     Head: Normocephalic and atraumatic.  Eyes:     General: No scleral icterus.    Conjunctiva/sclera: Conjunctivae normal.  Cardiovascular:     Rate and Rhythm: Normal rate and regular rhythm.  Pulmonary:     Effort: Pulmonary effort is normal. No respiratory distress.     Breath sounds: Normal breath sounds.  Abdominal:     General: There is no distension.     Palpations: Abdomen is soft.     Comments: Tenderness to palpation in the epigastric area.  Musculoskeletal:        General: Normal range of motion.  Skin:    General: Skin is warm and   dry.     Coloration: Skin is not jaundiced.  Neurological:     General: No focal deficit present.     Mental Status: She is alert and oriented to person, place, and time.  Psychiatric:        Mood and Affect: Mood normal.        Behavior: Behavior normal.            Assessment and Plan: Diagnoses and all orders for this visit:   Symptomatic cholelithiasis -     CCS Case Posting Request; Future       This is a 46 yo female presenting with persistent epigastric discomfort and gallstones on imaging. Her symptoms are consistent with biliary colic. I have reviewed her labs, referral notes and imaging. Ultrasound shows gallstones. Per her CT report, she had several small liver lesions which were likely hemangiomas or cysts - I counseled her that  these are benign and require no intervention. Laparoscopic cholecystectomy was recommended. The details of this procedure were discussed with the patient, including the risks of bleeding, infection, bile leak, and <0.5% risk of common bile duct injury. The patient expressed understanding and agrees to proceed with surgery. Postoperative restrictions and anticipated recovery time were also reviewed. She will be contacted to schedule an elective surgery date.  Monserrat Vidaurri, MD [facility] General, Hepatobiliary and Pancreatic Surgery 04/18/22 9:23 AM   

## 2022-04-18 NOTE — H&P (Signed)
History of Present Illness: Madeline Murphy is a 47 y.o. female who was referred to me for evaluation of gallstones. In late January she woke up with nausea/vomiting and severe epigastric pain. She was seen in the ED, and reportedly had a CT scan that did not show any acute abnormalities (we do not have the imaging). LFTs and lipase were normal. She then followed up with her PCP and was referred for a RUQ [place] on 2/7, which showed gallstones. She was referred to discuss surgery. She has continued to have discomfort and pressure in the epigastric area, although no pain as severe as she had previously. She also has frequent nausea. No fevers or chills. She has noticed that eating chocolate makes her symptoms worse.   She has not had any prior abdominal surgeries.     Review of Systems: A complete review of systems was obtained from the patient.  I have reviewed this information and discussed as appropriate with the patient.  See HPI as well for other ROS.     Medical History: Past Medical History Past Medical History: Diagnosis Date  Anxiety        There is no problem list on file for this patient.     Past Surgical History History reviewed. No pertinent surgical history.     Allergies Allergies Allergen Reactions  Melatonin Anxiety      Current Outpatient Medications on File Prior to Visit Medication Sig Dispense Refill  FUROsemide (LASIX) 20 MG tablet TAKE 1 TABLET BY MOUTH EVERY DAY AS NEEDED SWELLING      citalopram (CELEXA) 40 MG tablet Take 40 mg by mouth once daily      levonorgestreL (MIRENA 52 MG) IUD         No current facility-administered medications on file prior to visit.     Family History Family History Problem Relation Age of Onset  Obesity Father    High blood pressure (Hypertension) Father    Hyperlipidemia (Elevated cholesterol) Father    Coronary Artery Disease (Blocked arteries around heart) Father        Social History   Tobacco Use Smoking  Status Never Smokeless Tobacco Never     Social History Social History    Socioeconomic History  Marital status: Married Tobacco Use  Smoking status: Never  Smokeless tobacco: Never Substance and Sexual Activity  Alcohol use: Yes  Drug use: Never      Objective:     Vitals:   04/18/22 0902 BP: 124/72 Pulse: 70 Temp: 36.6 C (97.9 F) SpO2: 98% Weight: 87.9 kg (193 lb 12.8 oz) Height: 162.6 cm ('5\' 4"'$ )   Body mass index is 33.27 kg/m.   Physical Exam Vitals reviewed.  Constitutional:      General: She is not in acute distress.    Appearance: Normal appearance.  HENT:     Head: Normocephalic and atraumatic.  Eyes:     General: No scleral icterus.    Conjunctiva/sclera: Conjunctivae normal.  Cardiovascular:     Rate and Rhythm: Normal rate and regular rhythm.  Pulmonary:     Effort: Pulmonary effort is normal. No respiratory distress.     Breath sounds: Normal breath sounds.  Abdominal:     General: There is no distension.     Palpations: Abdomen is soft.     Comments: Tenderness to palpation in the epigastric area.  Musculoskeletal:        General: Normal range of motion.  Skin:    General: Skin is warm and  dry.     Coloration: Skin is not jaundiced.  Neurological:     General: No focal deficit present.     Mental Status: She is alert and oriented to person, place, and time.  Psychiatric:        Mood and Affect: Mood normal.        Behavior: Behavior normal.            Assessment and Plan: Diagnoses and all orders for this visit:   Symptomatic cholelithiasis -     CCS Case Posting Request; Future       This is a 47 yo female presenting with persistent epigastric discomfort and gallstones on imaging. Her symptoms are consistent with biliary colic. I have reviewed her labs, referral notes and imaging. Ultrasound shows gallstones. Per her CT report, she had several small liver lesions which were likely hemangiomas or cysts - I counseled her that  these are benign and require no intervention. Laparoscopic cholecystectomy was recommended. The details of this procedure were discussed with the patient, including the risks of bleeding, infection, bile leak, and <0.5% risk of common bile duct injury. The patient expressed understanding and agrees to proceed with surgery. Postoperative restrictions and anticipated recovery time were also reviewed. She will be contacted to schedule an elective surgery date.  Michaelle Birks, MD [facility] General, Hepatobiliary and Pancreatic Surgery 04/18/22 9:23 AM

## 2022-04-28 NOTE — Pre-Procedure Instructions (Signed)
Surgical Instructions    Your procedure is scheduled on Thursday, March 21st.  Report to [facility] Main Entrance "A" at 05:30 A.M., then check in with the Admitting office.  Call this number if you have problems the morning of surgery:  [phone]  If you have any questions prior to your surgery date call [phone]: Open Monday-Friday 8am-4pm If you experience any cold or flu symptoms such as cough, fever, chills, shortness of breath, etc. between now and your scheduled surgery, please notify Madeline Murphy at the above number.     Remember:  Do not eat after midnight the night before your surgery  You may drink clear liquids until 04:30 AM the morning of your surgery.   Clear liquids allowed are: Water, Non-Citrus Juices (without pulp), Carbonated Beverages, Clear Tea, Black Coffee Only (NO MILK, CREAM OR POWDERED CREAMER of any kind), and Gatorade.    Take these medicines the morning of surgery with A SIP OF WATER  citalopram (CELEXA)   If needed: acetaminophen (TYLENOL)    As of today, STOP taking any Aspirin (unless otherwise instructed by your surgeon) Aleve, Naproxen, Ibuprofen, Motrin, Advil, Goody's, BC's, all herbal medications, fish oil, and all vitamins.                     Do NOT Smoke (Tobacco/Vaping) for 24 hours prior to your procedure.  If you use a CPAP at night, you may bring your mask/headgear for your overnight stay.   Contacts, glasses, piercing's, hearing aid's, dentures or partials may not be worn into surgery, please bring cases for these belongings.    For patients admitted to the hospital, discharge time will be determined by your treatment team.   Patients discharged the day of surgery will not be allowed to drive home, and someone needs to stay with them for 24 hours.  SURGICAL WAITING ROOM VISITATION Patients having surgery or a procedure may have no more than 2 support people in the waiting area - these visitors may rotate.   Children under the age of  68 must have an adult with them who is not the patient. If the patient needs to stay at the hospital during part of their recovery, the visitor guidelines for inpatient rooms apply. Pre-op nurse will coordinate an appropriate time for 1 support person to accompany patient in pre-op.  This support person may not rotate.   Please refer to the [facility] website for the visitor guidelines for Inpatients (after your surgery is over and you are in a regular room).    Special instructions:   [facility]- Preparing For Surgery  Before surgery, you can play an important role. Because skin is not sterile, your skin needs to be as free of germs as possible. You can reduce the number of germs on your skin by washing with CHG (chlorahexidine gluconate) Soap before surgery.  CHG is an antiseptic cleaner which kills germs and bonds with the skin to continue killing germs even after washing.    Oral Hygiene is also important to reduce your risk of infection.  Remember - BRUSH YOUR TEETH THE MORNING OF SURGERY WITH YOUR REGULAR TOOTHPASTE  Please do not use if you have an allergy to CHG or antibacterial soaps. If your skin becomes reddened/irritated stop using the CHG.  Do not shave (including legs and underarms) for at least 48 hours prior to first CHG shower. It is OK to shave your face.  Please follow these instructions carefully.   Shower the  NIGHT BEFORE SURGERY and the MORNING OF SURGERY  If you chose to wash your hair, wash your hair first as usual with your normal shampoo.  After you shampoo, rinse your hair and body thoroughly to remove the shampoo.  Use CHG Soap as you would any other liquid soap. You can apply CHG directly to the skin and wash gently with a scrungie or a clean washcloth.   Apply the CHG Soap to your body ONLY FROM THE NECK DOWN.  Do not use on open wounds or open sores. Avoid contact with your eyes, ears, mouth and genitals (private parts). Wash Face and genitals (private  parts)  with your normal soap.   Wash thoroughly, paying special attention to the area where your surgery will be performed.  Thoroughly rinse your body with warm water from the neck down.  DO NOT shower/wash with your normal soap after using and rinsing off the CHG Soap.  Pat yourself dry with a CLEAN TOWEL.  Wear CLEAN PAJAMAS to bed the night before surgery  Place CLEAN SHEETS on your bed the night before your surgery  DO NOT SLEEP WITH PETS.   Day of Surgery: Take a shower with CHG soap. Do not wear jewelry or makeup Do not wear lotions, powders, perfumes, or deodorant. Do not shave 48 hours prior to surgery.   Do not bring valuables to the hospital. [facility] is not responsible for any belongings or valuables. Do not wear nail polish, gel polish, artificial nails, or any other type of covering on natural nails (fingers and toes) If you have artificial nails or gel coating that need to be removed by a nail salon, please have this removed prior to surgery. Artificial nails or gel coating may interfere with anesthesia's ability to adequately monitor your vital signs. Wear Clean/Comfortable clothing the morning of surgery Remember to brush your teeth WITH YOUR REGULAR TOOTHPASTE.   Please read over the following fact sheets that you were given.    If you received a COVID test during your pre-op visit  it is requested that you wear a mask when out in public, stay away from anyone that may not be feeling well and notify your surgeon if you develop symptoms. If you have been in contact with anyone that has tested positive in the last 10 days please notify you surgeon.

## 2022-05-01 ENCOUNTER — Encounter (HOSPITAL_COMMUNITY): Payer: Self-pay

## 2022-05-01 ENCOUNTER — Other Ambulatory Visit: Payer: Self-pay

## 2022-05-01 ENCOUNTER — Encounter (HOSPITAL_COMMUNITY)
Admission: RE | Admit: 2022-05-01 | Discharge: 2022-05-01 | Disposition: A | Discharge status: Home / Self Care | Payer: Self-pay | Source: Ambulatory Visit | Attending: Surgery | Admitting: Surgery

## 2022-05-01 VITALS — BP 112/73 | HR 66 | Temp 98.2°F | Resp 17 | Ht 64.0 in | Wt 193.6 lb

## 2022-05-01 DIAGNOSIS — Z8616 Personal history of COVID-19: Secondary | ICD-10-CM | POA: Insufficient documentation

## 2022-05-01 DIAGNOSIS — G2581 Restless legs syndrome: Secondary | ICD-10-CM | POA: Insufficient documentation

## 2022-05-01 DIAGNOSIS — R609 Edema, unspecified: Secondary | ICD-10-CM | POA: Insufficient documentation

## 2022-05-01 DIAGNOSIS — Z01812 Encounter for preprocedural laboratory examination: Secondary | ICD-10-CM | POA: Insufficient documentation

## 2022-05-01 DIAGNOSIS — E7800 Pure hypercholesterolemia, unspecified: Secondary | ICD-10-CM | POA: Insufficient documentation

## 2022-05-01 DIAGNOSIS — K8020 Calculus of gallbladder without cholecystitis without obstruction: Secondary | ICD-10-CM | POA: Insufficient documentation

## 2022-05-01 DIAGNOSIS — Z01818 Encounter for other preprocedural examination: Secondary | ICD-10-CM

## 2022-05-01 HISTORY — DX: Personal history of urinary calculi: Z87.442

## 2022-05-01 HISTORY — DX: Headache, unspecified: R51.9

## 2022-05-01 HISTORY — DX: Other specified postprocedural states: Z98.890

## 2022-05-01 LAB — CBC
HCT: 41.9 % (ref 36.0–46.0)
Hemoglobin: 13 g/dL (ref 12.0–15.0)
MCH: 26.1 pg (ref 26.0–34.0)
MCHC: 31 g/dL (ref 30.0–36.0)
MCV: 84 fL (ref 80.0–100.0)
Platelets: 262 10*3/uL (ref 150–400)
RBC: 4.99 MIL/uL (ref 3.87–5.11)
RDW: 13.3 % (ref 11.5–15.5)
WBC: 9.1 10*3/uL (ref 4.0–10.5)
nRBC: 0 % (ref 0.0–0.2)

## 2022-05-01 LAB — BASIC METABOLIC PANEL
Anion gap: 6 (ref 5–15)
BUN: 9 mg/dL (ref 6–20)
CO2: 27 mmol/L (ref 22–32)
Calcium: 9.2 mg/dL (ref 8.9–10.3)
Chloride: 103 mmol/L (ref 98–111)
Creatinine, Ser: 0.81 mg/dL (ref 0.44–1.00)
GFR, Estimated: >60 mL/min (ref >60)
Glucose, Bld: 94 mg/dL (ref 70–99)
Potassium: 4.1 mmol/L (ref 3.5–5.1)
Sodium: 136 mmol/L (ref 135–145)

## 2022-05-01 NOTE — Progress Notes (Signed)
PCP - McGowen, Adrian Blackwater, MD  Cardiologist - denies  PPM/ICD - denies   Chest x-ray - denies EKG - denies- N/A Stress Test - denies ECHO - denies Cardiac Cath - denies  Sleep Study - denies   Fasting Blood Sugar - N/A   Last dose of GLP1 agonist-  N/A   Blood Thinner Instructions: N/A Aspirin Instructions:N/A  ERAS Protcol - ERAS per order   COVID TEST- N/A   Anesthesia review: follow up requested EKG tracing from 03/12/22. If not received, will patient need EKG DOS?   Patient denies shortness of breath, fever, cough and chest pain at PAT appointment   All instructions explained to the patient, with a verbal understanding of the material. Patient agrees to go over the instructions while at home for a better understanding. The opportunity to ask questions was provided.

## 2022-05-02 NOTE — Anesthesia Preprocedure Evaluation (Addendum)
Anesthesia Evaluation  Patient identified by MRN, date of birth, ID band Patient awake    Reviewed: Allergy & Precautions, NPO status , Patient's Chart, lab work & pertinent test results  History of Anesthesia Complications (+) PONV and history of anesthetic complications  Airway Mallampati: II  TM Distance: >3 FB Neck ROM: Full    Dental no notable dental hx.    Pulmonary neg pulmonary ROS   Pulmonary exam normal        Cardiovascular negative cardio ROS  Rhythm:Regular Rate:Normal     Neuro/Psych  Headaches  Anxiety Depression       GI/Hepatic Neg liver ROS,GERD  Medicated,,Cholecystitis    Endo/Other  negative endocrine ROS    Renal/GU   negative genitourinary   Musculoskeletal negative musculoskeletal ROS (+)    Abdominal Normal abdominal exam  (+)   Peds  Hematology negative hematology ROS (+)   Anesthesia Other Findings   Reproductive/Obstetrics                             Anesthesia Physical Anesthesia Plan  ASA: 2  Anesthesia Plan: General   Post-op Pain Management: Celebrex PO (pre-op)* and Tylenol PO (pre-op)*   Induction: Intravenous  PONV Risk Score and Plan: 4 or greater and Ondansetron, Dexamethasone, Midazolam, Scopolamine patch - Pre-op, Amisulpride and Treatment may vary due to age or medical condition  Airway Management Planned: Mask and Oral ETT  Additional Equipment: None  Intra-op Plan:   Post-operative Plan: Extubation in OR  Informed Consent: I have reviewed the patients History and Physical, chart, labs and discussed the procedure including the risks, benefits and alternatives for the proposed anesthesia with the patient or authorized representative who has indicated his/her understanding and acceptance.     Dental advisory given  Plan Discussed with: CRNA  Anesthesia Plan Comments: (PAT note written 05/02/2022 by Shonna Chock, PA-C.  )        Anesthesia Quick Evaluation

## 2022-05-02 NOTE — Progress Notes (Signed)
Anesthesia Chart Review:  Case: [id number] Date/Time: 05/04/22 0715   Procedure: LAPAROSCOPIC CHOLECYSTECTOMY   Anesthesia type: General   Pre-op diagnosis: GALLSTONES   Location: [facility] ROOM 09 / [facility]   Surgeons: Dwan Bolt, MD       DISCUSSION: Patient is a 47 year old female scheduled for the above procedure.  History includes never smoker, post-operative N/V, hypercholesterolemia, RLS, BLE edema (2022, [place] negative for DVT 08/02/20), chronic LE compartment syndrome (required fasciotomies on left 1998 and bilaterally 1997 in [place], was playing soccer at that time), gallstones.  Anesthesia team to evaluate on the day of surgery.    VS: BP 112/73   Pulse 66   Temp 36.8 C   Resp 17   Ht 5\' 4"  (1.626 m)   Wt 87.8 kg   LMP  (Within Months) Comment: LMP within the last month  SpO2 98%   BMI 33.23 kg/m   PROVIDERS: McGowen, Adrian Blackwater, MD is PCP   LABS: Labs reviewed: Acceptable for surgery. For urine pregnancy test on the day of surgery.  (all labs ordered are listed, but only abnormal results are displayed)  Labs Reviewed  BASIC METABOLIC PANEL  CBC     IMAGES: [place] Abd (limited) 03/22/22 IMPRESSION: Cholelithiasis without secondary signs of acute cholecystitis   EKG: 03/12/22 ([facility]): NSR. Tracing on shadow chart.    CV: N/A  Past Medical History:  Diagnosis Date   Bilateral lower extremity edema 2022   LE venous doppler u/s-->no DVT   COVID-19 virus infection 02/2020   Depression with anxiety 09/12/2012   With some PMS components   Headache    Hidradenitis suppurativa    per GYN MD   History of kidney stones    Hypercholesterolemia    pt denies   Insomnia 09/12/2012   Nephrolithiasis    02/2022 CT abd/pelv with-->nonobstructing 2-3 mm left lower pole calculi.   Overweight(278.02) 06/02/2012   Pes anserine bursitis 12/2016   Steroid injection by Dr. Tamala Julian 12/2016   PONV (postoperative nausea and vomiting)    unsure if from anesthesia or pain meds   RLS  (restless legs syndrome) 09/12/2012   Symptomatic cholelithiasis    03/2022->ref gen surg   Vaginitis 06/02/2011   recurrent vulvovaginal candidiasis; hx of allergy/intolerance to OTC topical antifungal treatments.    Past Surgical History:  Procedure Laterality Date   chronc compartmenal syndrome Bilateral    S/P ortho surgery x3- bilateral legs   INTRAUTERINE DEVICE INSERTION  2017   Mirena    MEDICATIONS:  acetaminophen (TYLENOL) 325 MG tablet   citalopram (CELEXA) 40 MG tablet   furosemide (LASIX) 20 MG tablet   ibuprofen (ADVIL) 200 MG tablet   levonorgestrel (MIRENA) 20 MCG/24HR IUD   pantoprazole (PROTONIX) 40 MG tablet   rOPINIRole (REQUIP) 1 MG tablet   sucralfate (CARAFATE) 1 g tablet   No current facility-administered medications for this encounter.   Myra Gianotti, PA-C Surgical Short Stay/Anesthesiology [facility] Phone [phone] [facility] Phone [phone] 05/02/2022 12:28 PM

## 2022-05-04 ENCOUNTER — Ambulatory Visit (HOSPITAL_COMMUNITY)
Admission: RE | Admit: 2022-05-04 | Discharge: 2022-05-04 | Disposition: A | Discharge status: Home / Self Care | Payer: Self-pay | Attending: Surgery | Admitting: Surgery

## 2022-05-04 ENCOUNTER — Other Ambulatory Visit: Payer: Self-pay

## 2022-05-04 ENCOUNTER — Encounter (HOSPITAL_COMMUNITY): Payer: Self-pay | Admitting: Surgery

## 2022-05-04 ENCOUNTER — Ambulatory Visit (HOSPITAL_COMMUNITY): Payer: Self-pay | Admitting: Vascular Surgery

## 2022-05-04 ENCOUNTER — Encounter (HOSPITAL_COMMUNITY)
Admission: RE | Disposition: A | Discharge status: Home / Self Care | Payer: Self-pay | Source: Home / Self Care | Attending: Surgery

## 2022-05-04 ENCOUNTER — Ambulatory Visit (HOSPITAL_BASED_OUTPATIENT_CLINIC_OR_DEPARTMENT_OTHER): Payer: Self-pay

## 2022-05-04 DIAGNOSIS — D1803 Hemangioma of intra-abdominal structures: Secondary | ICD-10-CM

## 2022-05-04 DIAGNOSIS — K219 Gastro-esophageal reflux disease without esophagitis: Secondary | ICD-10-CM | POA: Insufficient documentation

## 2022-05-04 DIAGNOSIS — K769 Liver disease, unspecified: Secondary | ICD-10-CM | POA: Insufficient documentation

## 2022-05-04 DIAGNOSIS — Z01818 Encounter for other preprocedural examination: Secondary | ICD-10-CM

## 2022-05-04 DIAGNOSIS — K8010 Calculus of gallbladder with chronic cholecystitis without obstruction: Secondary | ICD-10-CM | POA: Insufficient documentation

## 2022-05-04 DIAGNOSIS — K8020 Calculus of gallbladder without cholecystitis without obstruction: Secondary | ICD-10-CM

## 2022-05-04 DIAGNOSIS — F418 Other specified anxiety disorders: Secondary | ICD-10-CM | POA: Insufficient documentation

## 2022-05-04 HISTORY — PX: CHOLECYSTECTOMY: SHX55

## 2022-05-04 LAB — POCT PREGNANCY, URINE: Preg Test, Ur: NEGATIVE

## 2022-05-04 SURGERY — LAPAROSCOPIC CHOLECYSTECTOMY
Anesthesia: General | Site: Abdomen

## 2022-05-04 MED ORDER — EPHEDRINE 5 MG/ML INJ
INTRAVENOUS | Status: AC
  Filled 2022-05-04: qty 5

## 2022-05-04 MED ORDER — CHLORHEXIDINE GLUCONATE 0.12 % MT SOLN
15.0000 mL | Freq: Once | OROMUCOSAL | Status: AC
  Administered 2022-05-04: 15 mL via OROMUCOSAL
  Filled 2022-05-04: qty 15

## 2022-05-04 MED ORDER — DIPHENHYDRAMINE HCL 50 MG/ML IJ SOLN
INTRAMUSCULAR | Status: DC | PRN
  Administered 2022-05-04: 25 mg via INTRAVENOUS

## 2022-05-04 MED ORDER — AMISULPRIDE (ANTIEMETIC) 5 MG/2ML IV SOLN: 10.0000 mg | Freq: Once | INTRAVENOUS | Status: DC | PRN

## 2022-05-04 MED ORDER — CEFAZOLIN SODIUM-DEXTROSE 2-4 GM/100ML-% IV SOLN
2.0000 g | INTRAVENOUS | Status: AC
  Administered 2022-05-04: 2 g via INTRAVENOUS
  Filled 2022-05-04: qty 100

## 2022-05-04 MED ORDER — FENTANYL CITRATE (PF) 250 MCG/5ML IJ SOLN
INTRAMUSCULAR | Status: AC
  Filled 2022-05-04: qty 5

## 2022-05-04 MED ORDER — ROCURONIUM BROMIDE 10 MG/ML (PF) SYRINGE
PREFILLED_SYRINGE | INTRAVENOUS | Status: DC | PRN
  Administered 2022-05-04: 60 mg via INTRAVENOUS

## 2022-05-04 MED ORDER — LIDOCAINE 2% (20 MG/ML) 5 ML SYRINGE
INTRAMUSCULAR | Status: DC | PRN
  Administered 2022-05-04: 60 mg via INTRAVENOUS

## 2022-05-04 MED ORDER — EPHEDRINE SULFATE-NACL 50-0.9 MG/10ML-% IV SOSY
PREFILLED_SYRINGE | INTRAVENOUS | Status: DC | PRN
  Administered 2022-05-04: 5 mg via INTRAVENOUS

## 2022-05-04 MED ORDER — PROPOFOL 500 MG/50ML IV EMUL
INTRAVENOUS | Status: DC | PRN
  Administered 2022-05-04: 150 ug/kg/min via INTRAVENOUS

## 2022-05-04 MED ORDER — MIDAZOLAM HCL 2 MG/2ML IJ SOLN
INTRAMUSCULAR | Status: AC
  Filled 2022-05-04: qty 2

## 2022-05-04 MED ORDER — DEXAMETHASONE SODIUM PHOSPHATE 10 MG/ML IJ SOLN
INTRAMUSCULAR | Status: AC
  Filled 2022-05-04: qty 1

## 2022-05-04 MED ORDER — 0.9 % SODIUM CHLORIDE (POUR BTL) OPTIME
TOPICAL | Status: DC | PRN
  Administered 2022-05-04: 1000 mL

## 2022-05-04 MED ORDER — BUPIVACAINE HCL (PF) 0.25 % IJ SOLN
INTRAMUSCULAR | Status: AC
  Filled 2022-05-04: qty 30

## 2022-05-04 MED ORDER — CELECOXIB 200 MG PO CAPS
200.0000 mg | ORAL_CAPSULE | Freq: Once | ORAL | Status: AC
  Administered 2022-05-04: 200 mg via ORAL
  Filled 2022-05-04: qty 1

## 2022-05-04 MED ORDER — PROPOFOL 10 MG/ML IV BOLUS
INTRAVENOUS | Status: DC | PRN
  Administered 2022-05-04: 170 mg via INTRAVENOUS

## 2022-05-04 MED ORDER — ROCURONIUM BROMIDE 10 MG/ML (PF) SYRINGE
PREFILLED_SYRINGE | INTRAVENOUS | Status: AC
  Filled 2022-05-04: qty 10

## 2022-05-04 MED ORDER — PROPOFOL 10 MG/ML IV BOLUS
INTRAVENOUS | Status: AC
  Filled 2022-05-04: qty 20

## 2022-05-04 MED ORDER — DEXAMETHASONE SODIUM PHOSPHATE 10 MG/ML IJ SOLN
INTRAMUSCULAR | Status: DC | PRN
  Administered 2022-05-04: 5 mg via INTRAVENOUS

## 2022-05-04 MED ORDER — MIDAZOLAM HCL 2 MG/2ML IJ SOLN
INTRAMUSCULAR | Status: DC | PRN
  Administered 2022-05-04: 2 mg via INTRAVENOUS

## 2022-05-04 MED ORDER — ONDANSETRON HCL 4 MG/2ML IJ SOLN
INTRAMUSCULAR | Status: DC | PRN
  Administered 2022-05-04: 4 mg via INTRAVENOUS

## 2022-05-04 MED ORDER — ONDANSETRON HCL 4 MG/2ML IJ SOLN
INTRAMUSCULAR | Status: AC
  Filled 2022-05-04: qty 2

## 2022-05-04 MED ORDER — GLYCOPYRROLATE PF 0.2 MG/ML IJ SOSY
PREFILLED_SYRINGE | INTRAMUSCULAR | Status: AC
  Filled 2022-05-04: qty 1

## 2022-05-04 MED ORDER — FENTANYL CITRATE (PF) 100 MCG/2ML IJ SOLN
INTRAMUSCULAR | Status: AC
  Filled 2022-05-04: qty 2

## 2022-05-04 MED ORDER — SCOPOLAMINE 1 MG/3DAYS TD PT72
1.0000 | MEDICATED_PATCH | TRANSDERMAL | Status: DC
  Administered 2022-05-04: 1.5 mg via TRANSDERMAL
  Filled 2022-05-04: qty 1

## 2022-05-04 MED ORDER — DIPHENHYDRAMINE HCL 50 MG/ML IJ SOLN
INTRAMUSCULAR | Status: AC
  Filled 2022-05-04: qty 1

## 2022-05-04 MED ORDER — SUGAMMADEX SODIUM 200 MG/2ML IV SOLN
INTRAVENOUS | Status: DC | PRN
  Administered 2022-05-04: 200 mg via INTRAVENOUS

## 2022-05-04 MED ORDER — BUPIVACAINE HCL 0.25 % IJ SOLN
INTRAMUSCULAR | Status: DC | PRN
  Administered 2022-05-04: 30 mL

## 2022-05-04 MED ORDER — HYDROCODONE-ACETAMINOPHEN 5-325 MG PO TABS: 1.0000 | ORAL_TABLET | Freq: Four times a day (QID) | ORAL | 0 refills | Status: AC | PRN

## 2022-05-04 MED ORDER — ACETAMINOPHEN 500 MG PO TABS
1000.0000 mg | ORAL_TABLET | Freq: Once | ORAL | Status: AC
  Administered 2022-05-04: 1000 mg via ORAL
  Filled 2022-05-04: qty 2

## 2022-05-04 MED ORDER — LIDOCAINE 2% (20 MG/ML) 5 ML SYRINGE
INTRAMUSCULAR | Status: AC
  Filled 2022-05-04: qty 5

## 2022-05-04 MED ORDER — ORAL CARE MOUTH RINSE: 15.0000 mL | Freq: Once | OROMUCOSAL | Status: AC

## 2022-05-04 MED ORDER — SODIUM CHLORIDE 0.9 % IR SOLN
Status: DC | PRN
  Administered 2022-05-04: 1000 mL

## 2022-05-04 MED ORDER — FENTANYL CITRATE (PF) 100 MCG/2ML IJ SOLN
25.0000 ug | INTRAMUSCULAR | Status: DC | PRN
  Administered 2022-05-04: 25 ug via INTRAVENOUS

## 2022-05-04 MED ORDER — FENTANYL CITRATE (PF) 250 MCG/5ML IJ SOLN
INTRAMUSCULAR | Status: DC | PRN
  Administered 2022-05-04: 100 ug via INTRAVENOUS

## 2022-05-04 MED ORDER — LACTATED RINGERS IV SOLN: INTRAVENOUS | Status: DC

## 2022-05-04 SURGICAL SUPPLY — 43 items
ADH SKN CLS APL DERMABOND .7 (GAUZE/BANDAGES/DRESSINGS) ×1
APL PRP STRL LF DISP 70% ISPRP (MISCELLANEOUS) ×1
APPLIER CLIP 5 13 M/L LIGAMAX5 (MISCELLANEOUS) ×1
APR CLP MED LRG 5 ANG JAW (MISCELLANEOUS) ×1
BAG SPEC RTRVL 10 TROC 200 (ENDOMECHANICALS)
CANISTER SUCT 3000ML PPV (MISCELLANEOUS) ×1 IMPLANT
CHLORAPREP W/TINT 26 (MISCELLANEOUS) ×1 IMPLANT
CLIP APPLIE 5 13 M/L LIGAMAX5 (MISCELLANEOUS) ×1 IMPLANT
COVER SURGICAL LIGHT HANDLE (MISCELLANEOUS) ×1 IMPLANT
DERMABOND ADVANCED .7 DNX12 (GAUZE/BANDAGES/DRESSINGS) ×1 IMPLANT
ELECT REM PT RETURN 9FT ADLT (ELECTROSURGICAL) ×1
ELECTRODE REM PT RTRN 9FT ADLT (ELECTROSURGICAL) ×1 IMPLANT
GLOVE BIOGEL PI IND STRL 6 (GLOVE) ×1 IMPLANT
GLOVE BIOGEL PI MICRO STRL 5.5 (GLOVE) ×1 IMPLANT
GOWN STRL REUS W/ TWL LRG LVL3 (GOWN DISPOSABLE) ×3 IMPLANT
GOWN STRL REUS W/TWL LRG LVL3 (GOWN DISPOSABLE) ×3
IRRIG SUCT STRYKERFLOW 2 WTIP (MISCELLANEOUS) ×1
IRRIGATION SUCT STRKRFLW 2 WTP (MISCELLANEOUS) ×1 IMPLANT
KIT BASIN OR (CUSTOM PROCEDURE TRAY) ×1 IMPLANT
KIT TURNOVER KIT B (KITS) ×1 IMPLANT
L-HOOK LAP DISP 36CM (ELECTROSURGICAL) ×1
LHOOK LAP DISP 36CM (ELECTROSURGICAL) ×1 IMPLANT
NDL INSUFFLATION 14GA 120MM (NEEDLE) IMPLANT
NEEDLE INSUFFLATION 14GA 120MM (NEEDLE) IMPLANT
NS IRRIG 1000ML POUR BTL (IV SOLUTION) ×1 IMPLANT
PAD ARMBOARD 7.5X6 YLW CONV (MISCELLANEOUS) ×1 IMPLANT
PENCIL BUTTON HOLSTER BLD 10FT (ELECTRODE) ×1 IMPLANT
POUCH RETRIEVAL ECOSAC 10 (ENDOMECHANICALS) IMPLANT
POUCH RETRIEVAL ECOSAC 10MM (ENDOMECHANICALS)
SCISSORS LAP 5X35 DISP (ENDOMECHANICALS) ×1 IMPLANT
SET TUBE SMOKE EVAC HIGH FLOW (TUBING) ×1 IMPLANT
SLEEVE Z-THREAD 5X100MM (TROCAR) ×2 IMPLANT
SUT MNCRL AB 4-0 PS2 18 (SUTURE) ×1 IMPLANT
SYS BAG RETRIEVAL 10MM (BASKET) ×1
SYSTEM BAG RETRIEVAL 10MM (BASKET) IMPLANT
TOWEL GREEN STERILE (TOWEL DISPOSABLE) ×1 IMPLANT
TOWEL GREEN STERILE FF (TOWEL DISPOSABLE) ×1 IMPLANT
TRAY LAPAROSCOPIC MC (CUSTOM PROCEDURE TRAY) ×1 IMPLANT
TROCAR BALLN 12MMX100 BLUNT (TROCAR) ×1 IMPLANT
TROCAR Z THREAD OPTICAL 12X100 (TROCAR) IMPLANT
TROCAR Z-THREAD OPTICAL 5X100M (TROCAR) ×1 IMPLANT
WARMER LAPAROSCOPE (MISCELLANEOUS) ×1 IMPLANT
WATER STERILE IRR 1000ML POUR (IV SOLUTION) ×1 IMPLANT

## 2022-05-04 NOTE — Anesthesia Procedure Notes (Signed)
Procedure Name: Intubation Date/Time: 05/04/2022 7:36 AM  Performed by: Valda Favia, CRNAPre-anesthesia Checklist: Patient identified, Emergency Drugs available, Suction available and Patient being monitored Patient Re-evaluated:Patient Re-evaluated prior to induction Oxygen Delivery Method: Circle System Utilized Preoxygenation: Pre-oxygenation with 100% oxygen Induction Type: IV induction Ventilation: Mask ventilation without difficulty Laryngoscope Size: Mac and 4 Grade View: Grade I Tube type: Oral Tube size: 7.5 mm Number of attempts: 1 Airway Equipment and Method: Stylet Placement Confirmation: ETT inserted through vocal cords under direct vision, positive ETCO2 and breath sounds checked- equal and bilateral Secured at: 20 cm Tube secured with: Tape Dental Injury: Teeth and Oropharynx as per pre-operative assessment

## 2022-05-04 NOTE — Transfer of Care (Signed)
Immediate Anesthesia Transfer of Care Note  Patient: Madeline Murphy  Procedure(s) Performed: LAPAROSCOPIC CHOLECYSTECTOMY (Abdomen)  Patient Location: PACU  Anesthesia Type:General  Level of Consciousness: awake, alert , and oriented  Airway & Oxygen Therapy: Patient Spontanous Breathing and Patient connected to nasal cannula oxygen  Post-op Assessment: Report given to RN and Post -op Vital signs reviewed and stable  Post vital signs: Reviewed and stable  Last Vitals:  Vitals Value Taken Time  BP 106/82 05/04/22 0830  Temp    Pulse 60 05/04/22 0832  Resp 11 05/04/22 0832  SpO2 99 % 05/04/22 0832  Vitals shown include unvalidated device data.  Last Pain:  Vitals:   05/04/22 0614  TempSrc:   PainSc: 0-No pain         Complications: No notable events documented.

## 2022-05-04 NOTE — Discharge Instructions (Addendum)
[facility] [place] [facility] DISCHARGE INSTRUCTIONS  Activity No heavy lifting greater than 15 pounds for 4 weeks after surgery. Ok to shower in 24 hours, but do not bathe or submerge incisions underwater. Do not drive while taking narcotic pain medication.  Wound Care Your incisions are covered with skin glue called Dermabond. This will peel off on its own over time. You may shower and allow warm soapy water to run over your incisions. Gently pat dry. Do not submerge your incision underwater for 2 weeks. Monitor your incision for any new redness, tenderness, or drainage.  When to Call us: Fever greater than 100.5 New redness, drainage, or swelling at incision site Severe pain, nausea, or vomiting Jaundice (yellowing of the whites of the eyes or skin)  Follow-up You have an appointment scheduled with Dr. Zenia Resides on May 26, 2022 at 11:10am. This will be at the [facility] Surgery office at [address] [place], [place], [place]. Please arrive at least 15 minutes prior to your scheduled appointment time.  For questions or concerns, please call the office at [phone].

## 2022-05-04 NOTE — Op Note (Signed)
Date: 05/04/22  Patient: Madeline Murphy MRN: [id number]  Preoperative Diagnosis: Symptomatic cholelithiasis Postoperative Diagnosis: Same  Procedure: Laparoscopic cholecystectomy  Surgeon: Michaelle Birks, MD  EBL: Minimal  Anesthesia: General endotracheal  Specimens: Gallbladder  Indications: Madeline Murphy is a 47 yo female who presented with nausea/vomiting and epigastric pain. Workup in the ED showed cholelithiasis without acute cholecystitis. She has continued to have abdominal discomfort consistent with biliary colic. The patient also had benign appearing liver lesions noted on a CT scan at an outside hospital. After a discussion of the risks and benefits of surgery, she agreed to proceed with cholecystectomy.   Findings: Cholelithiasis without acute cholecystitis. Small benign hemangioma on the surface of the right liver.  Procedure details: Informed consent was obtained in the preoperative area prior to the procedure. The patient was brought to the operating room and placed on the table in the supine position. General anesthesia was induced and appropriate lines and drains were placed for intraoperative monitoring. Perioperative antibiotics were administered per SCIP guidelines. The abdomen was prepped and draped in the usual sterile fashion. A pre-procedure timeout was taken verifying patient identity, surgical site and procedure to be performed.  A small infraumbilical skin incision was made, the subcutaneous tissue was divided with cautery, and the umbilical stalk was grasped and elevated. The fascia was incised and the peritoneal cavity was directly visualized. A 6mm Hassan trocar was placed and the abdomen was insufflated. The peritoneal cavity was inspected with no evidence of visceral or vascular injury. Three 54mm ports were placed in the right subcostal margin, all under direct visualization. The fundus of the gallbladder was grasped and retracted cephalad. The infundibulum was retracted  laterally. The peritoneum over the gallbladder was opened with cautery, and the cystic triangle was dissected out using cautery and blunt dissection. The cystic artery was very small and was divided with cautery during this dissection.The cystic duct was circumferentially dissected out and the critical view of safety was obtained. The cystic duct was clipped and divided close to the gallbladder, leaving two clips behind on the cystic duct stump. The gallbladder was taken off the liver using cautery, and the specimen was placed in an endocatch bag. The surgical site was inspected and the gallbladder fossa was hemostatic. There was no evidence of bile leak from the cystic duct stump. The liver was examined and there was a small lesion on the surface of the right liver, less than 1cm in diameter, that was consistent with a benign hemangioma. No malignant appearing lesions were noted.  The ports were removed under direct visualization and the abdomen was desufflated. The specimen was extracted via the umbilical port site. The umbilical port site fascia was closed with a 0 vicryl suture. The skin at all port sites was closed with 4-0 monocryl subcuticular suture. Dermabond was applied.  The patient tolerated the procedure well with no apparent complications. All counts were correct x2 at the end of the procedure. The patient was extubated and taken to PACU in stable condition.  Michaelle Birks, MD 05/04/22 8:18 AM

## 2022-05-04 NOTE — Interval H&P Note (Signed)
History and Physical Interval Note:  05/04/2022 7:18 AM  Madeline Murphy  has presented today for surgery, with the diagnosis of GALLSTONES.  The various methods of treatment have been discussed with the patient and family. After consideration of risks, benefits and other options for treatment, the patient has consented to  Procedure(s): LAPAROSCOPIC CHOLECYSTECTOMY (N/A) as a surgical intervention.  The patient's history has been reviewed, patient examined, no change in status, stable for surgery.  I have reviewed the patient's chart and labs.  Questions were answered to the patient's satisfaction.  Postop instructions and restrictions reviewed.   Dwan Bolt

## 2022-05-04 NOTE — Anesthesia Postprocedure Evaluation (Signed)
Anesthesia Post Note  Patient: Madeline Murphy  Procedure(s) Performed: LAPAROSCOPIC CHOLECYSTECTOMY (Abdomen)     Patient location during evaluation: PACU Anesthesia Type: General Level of consciousness: awake and alert Pain management: pain level controlled Vital Signs Assessment: post-procedure vital signs reviewed and stable Respiratory status: spontaneous breathing, nonlabored ventilation, respiratory function stable and patient connected to nasal cannula oxygen Cardiovascular status: blood pressure returned to baseline and stable Postop Assessment: no apparent nausea or vomiting Anesthetic complications: no   No notable events documented.  Last Vitals:  Vitals:   05/04/22 0900 05/04/22 0915  BP: 116/83 121/84  Pulse: (!) 56 (!) 59  Resp: (!) 9 12  Temp:  36.6 C  SpO2: 96% 94%    Last Pain:  Vitals:   05/04/22 0915  TempSrc:   PainSc: 3                  Nikeia Henkes P Anterrio Mccleery

## 2022-05-05 ENCOUNTER — Encounter (HOSPITAL_COMMUNITY): Payer: Self-pay | Admitting: Surgery

## 2022-05-05 LAB — SURGICAL PATHOLOGY

## 2023-11-19 LAB — HM MAMMOGRAPHY

## 2024-02-22 LAB — HM COLONOSCOPY

## 2024-03-06 ENCOUNTER — Encounter: Payer: Self-pay | Admitting: Family Medicine
